# Patient Record
Sex: Female | Born: 1985 | Race: Black or African American | Hispanic: No | Marital: Single | Smoking: Current every day smoker
Health system: Southern US, Community
[De-identification: ages and names within clinical notes are randomized; demographics above are authoritative.]

## PROBLEM LIST (undated history)

## (undated) DIAGNOSIS — N83209 Unspecified ovarian cyst, unspecified side: Secondary | ICD-10-CM

## (undated) DIAGNOSIS — N200 Calculus of kidney: Secondary | ICD-10-CM

## (undated) DIAGNOSIS — G8929 Other chronic pain: Secondary | ICD-10-CM

## (undated) DIAGNOSIS — R102 Pelvic and perineal pain: Secondary | ICD-10-CM

## (undated) DIAGNOSIS — N390 Urinary tract infection, site not specified: Secondary | ICD-10-CM

## (undated) HISTORY — PX: LITHOTRIPSY: SUR834

## (undated) HISTORY — PX: OVARIAN CYST REMOVAL: SHX89

## (undated) HISTORY — PX: LAPAROSCOPIC TUBAL LIGATION: SUR803

## (undated) HISTORY — PX: CHOLECYSTECTOMY: SHX55

## (undated) HISTORY — PX: APPENDECTOMY: SHX54

---
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
## 2004-09-23 ENCOUNTER — Emergency Department (HOSPITAL_COMMUNITY): Admission: EM | Admit: 2004-09-23 | Discharge: 2004-09-23 | Payer: Self-pay | Admitting: Emergency Medicine

## 2004-10-12 ENCOUNTER — Emergency Department (HOSPITAL_COMMUNITY): Admission: EM | Admit: 2004-10-12 | Discharge: 2004-10-12 | Payer: Self-pay | Admitting: Emergency Medicine

## 2004-10-21 ENCOUNTER — Emergency Department (HOSPITAL_COMMUNITY): Admission: EM | Admit: 2004-10-21 | Discharge: 2004-10-21 | Payer: Self-pay | Admitting: Emergency Medicine

## 2004-12-07 ENCOUNTER — Emergency Department (HOSPITAL_COMMUNITY): Admission: EM | Admit: 2004-12-07 | Discharge: 2004-12-07 | Payer: Self-pay | Admitting: Emergency Medicine

## 2004-12-29 ENCOUNTER — Emergency Department (HOSPITAL_COMMUNITY): Admission: EM | Admit: 2004-12-29 | Discharge: 2004-12-30 | Payer: Self-pay | Admitting: Emergency Medicine

## 2005-01-07 ENCOUNTER — Emergency Department (HOSPITAL_COMMUNITY): Admission: EM | Admit: 2005-01-07 | Discharge: 2005-01-07 | Payer: Self-pay | Admitting: Emergency Medicine

## 2005-01-19 ENCOUNTER — Emergency Department (HOSPITAL_COMMUNITY): Admission: EM | Admit: 2005-01-19 | Discharge: 2005-01-19 | Payer: Self-pay | Admitting: Emergency Medicine

## 2005-01-25 ENCOUNTER — Emergency Department (HOSPITAL_COMMUNITY): Admission: EM | Admit: 2005-01-25 | Discharge: 2005-01-25 | Payer: Self-pay | Admitting: Emergency Medicine

## 2005-03-15 ENCOUNTER — Emergency Department (HOSPITAL_COMMUNITY): Admission: EM | Admit: 2005-03-15 | Discharge: 2005-03-16 | Payer: Self-pay | Admitting: Emergency Medicine

## 2005-09-20 ENCOUNTER — Inpatient Hospital Stay (HOSPITAL_COMMUNITY): Admission: EM | Admit: 2005-09-20 | Discharge: 2005-09-23 | Payer: Self-pay | Admitting: Emergency Medicine

## 2005-09-22 ENCOUNTER — Encounter (INDEPENDENT_AMBULATORY_CARE_PROVIDER_SITE_OTHER): Payer: Self-pay | Admitting: *Deleted

## 2005-12-06 ENCOUNTER — Emergency Department (HOSPITAL_COMMUNITY): Admission: EM | Admit: 2005-12-06 | Discharge: 2005-12-06 | Payer: Self-pay | Admitting: *Deleted

## 2006-03-18 ENCOUNTER — Emergency Department (HOSPITAL_COMMUNITY): Admission: EM | Admit: 2006-03-18 | Discharge: 2006-03-19 | Payer: Self-pay | Admitting: Emergency Medicine

## 2006-04-02 ENCOUNTER — Inpatient Hospital Stay (HOSPITAL_COMMUNITY): Admission: EM | Admit: 2006-04-02 | Discharge: 2006-04-08 | Payer: Self-pay | Admitting: Emergency Medicine

## 2006-07-16 ENCOUNTER — Inpatient Hospital Stay (HOSPITAL_COMMUNITY): Admission: EM | Admit: 2006-07-16 | Discharge: 2006-07-18 | Payer: Self-pay | Admitting: Emergency Medicine

## 2007-01-30 ENCOUNTER — Emergency Department (HOSPITAL_COMMUNITY): Admission: EM | Admit: 2007-01-30 | Discharge: 2007-01-31 | Payer: Self-pay | Admitting: Emergency Medicine

## 2007-10-12 ENCOUNTER — Emergency Department (HOSPITAL_COMMUNITY): Admission: EM | Admit: 2007-10-12 | Discharge: 2007-10-12 | Payer: Self-pay | Admitting: Emergency Medicine

## 2007-10-28 ENCOUNTER — Emergency Department (HOSPITAL_COMMUNITY): Admission: EM | Admit: 2007-10-28 | Discharge: 2007-10-29 | Payer: Self-pay | Admitting: Emergency Medicine

## 2007-12-03 ENCOUNTER — Emergency Department (HOSPITAL_COMMUNITY): Admission: EM | Admit: 2007-12-03 | Discharge: 2007-12-03 | Payer: Self-pay | Admitting: Emergency Medicine

## 2008-01-15 ENCOUNTER — Emergency Department (HOSPITAL_COMMUNITY): Admission: EM | Admit: 2008-01-15 | Discharge: 2008-01-16 | Payer: Self-pay | Admitting: Emergency Medicine

## 2008-01-22 ENCOUNTER — Emergency Department (HOSPITAL_COMMUNITY): Admission: EM | Admit: 2008-01-22 | Discharge: 2008-01-22 | Payer: Self-pay | Admitting: Emergency Medicine

## 2008-03-05 ENCOUNTER — Emergency Department (HOSPITAL_COMMUNITY): Admission: EM | Admit: 2008-03-05 | Discharge: 2008-03-05 | Payer: Self-pay | Admitting: Emergency Medicine

## 2008-03-25 ENCOUNTER — Emergency Department (HOSPITAL_COMMUNITY): Admission: EM | Admit: 2008-03-25 | Discharge: 2008-03-25 | Payer: Self-pay | Admitting: Emergency Medicine

## 2008-03-28 ENCOUNTER — Inpatient Hospital Stay (HOSPITAL_COMMUNITY): Admission: EM | Admit: 2008-03-28 | Discharge: 2008-03-30 | Payer: Self-pay | Admitting: Emergency Medicine

## 2008-04-19 ENCOUNTER — Emergency Department (HOSPITAL_COMMUNITY): Admission: EM | Admit: 2008-04-19 | Discharge: 2008-04-19 | Payer: Self-pay | Admitting: Emergency Medicine

## 2008-05-27 ENCOUNTER — Emergency Department (HOSPITAL_COMMUNITY): Admission: EM | Admit: 2008-05-27 | Discharge: 2008-05-27 | Payer: Self-pay | Admitting: Emergency Medicine

## 2008-05-31 ENCOUNTER — Emergency Department (HOSPITAL_COMMUNITY): Admission: EM | Admit: 2008-05-31 | Discharge: 2008-06-01 | Payer: Self-pay | Admitting: Emergency Medicine

## 2008-06-09 ENCOUNTER — Emergency Department (HOSPITAL_COMMUNITY): Admission: EM | Admit: 2008-06-09 | Discharge: 2008-06-09 | Payer: Self-pay | Admitting: Emergency Medicine

## 2008-06-12 ENCOUNTER — Emergency Department (HOSPITAL_COMMUNITY): Admission: EM | Admit: 2008-06-12 | Discharge: 2008-06-12 | Payer: Self-pay | Admitting: Emergency Medicine

## 2008-06-26 ENCOUNTER — Emergency Department (HOSPITAL_COMMUNITY): Admission: EM | Admit: 2008-06-26 | Discharge: 2008-06-26 | Payer: Self-pay | Admitting: Emergency Medicine

## 2008-07-01 ENCOUNTER — Emergency Department (HOSPITAL_COMMUNITY): Admission: EM | Admit: 2008-07-01 | Discharge: 2008-07-01 | Payer: Self-pay | Admitting: Emergency Medicine

## 2008-07-11 ENCOUNTER — Emergency Department (HOSPITAL_COMMUNITY): Admission: EM | Admit: 2008-07-11 | Discharge: 2008-07-11 | Payer: Self-pay | Admitting: Emergency Medicine

## 2008-07-23 ENCOUNTER — Inpatient Hospital Stay (HOSPITAL_COMMUNITY): Admission: RE | Admit: 2008-07-23 | Discharge: 2008-07-25 | Payer: Self-pay | Admitting: Obstetrics and Gynecology

## 2008-07-23 ENCOUNTER — Other Ambulatory Visit: Payer: Self-pay | Admitting: Obstetrics and Gynecology

## 2008-07-30 ENCOUNTER — Ambulatory Visit (HOSPITAL_COMMUNITY): Admission: RE | Admit: 2008-07-30 | Discharge: 2008-07-30 | Payer: Self-pay | Admitting: Obstetrics and Gynecology

## 2008-08-02 ENCOUNTER — Emergency Department (HOSPITAL_COMMUNITY): Admission: EM | Admit: 2008-08-02 | Discharge: 2008-08-03 | Payer: Self-pay | Admitting: Emergency Medicine

## 2008-09-15 ENCOUNTER — Emergency Department (HOSPITAL_COMMUNITY): Admission: EM | Admit: 2008-09-15 | Discharge: 2008-09-15 | Payer: Self-pay | Admitting: Emergency Medicine

## 2008-09-19 ENCOUNTER — Emergency Department (HOSPITAL_COMMUNITY): Admission: EM | Admit: 2008-09-19 | Discharge: 2008-09-19 | Payer: Self-pay | Admitting: Emergency Medicine

## 2008-10-30 ENCOUNTER — Emergency Department (HOSPITAL_COMMUNITY): Admission: EM | Admit: 2008-10-30 | Discharge: 2008-10-31 | Payer: Self-pay | Admitting: Emergency Medicine

## 2008-11-20 ENCOUNTER — Emergency Department (HOSPITAL_COMMUNITY): Admission: EM | Admit: 2008-11-20 | Discharge: 2008-11-20 | Payer: Self-pay | Admitting: Emergency Medicine

## 2009-01-08 ENCOUNTER — Emergency Department (HOSPITAL_COMMUNITY): Admission: EM | Admit: 2009-01-08 | Discharge: 2009-01-08 | Payer: Self-pay | Admitting: Emergency Medicine

## 2009-01-22 ENCOUNTER — Emergency Department (HOSPITAL_COMMUNITY): Admission: EM | Admit: 2009-01-22 | Discharge: 2009-01-22 | Payer: Self-pay | Admitting: Emergency Medicine

## 2009-01-26 ENCOUNTER — Emergency Department (HOSPITAL_COMMUNITY): Admission: EM | Admit: 2009-01-26 | Discharge: 2009-01-27 | Payer: Self-pay | Admitting: Emergency Medicine

## 2009-02-18 ENCOUNTER — Emergency Department (HOSPITAL_COMMUNITY): Admission: EM | Admit: 2009-02-18 | Discharge: 2009-02-18 | Payer: Self-pay | Admitting: Emergency Medicine

## 2009-04-16 ENCOUNTER — Emergency Department (HOSPITAL_COMMUNITY): Admission: EM | Admit: 2009-04-16 | Discharge: 2009-04-16 | Payer: Self-pay | Admitting: Emergency Medicine

## 2009-07-03 ENCOUNTER — Emergency Department (HOSPITAL_COMMUNITY): Admission: EM | Admit: 2009-07-03 | Discharge: 2009-07-03 | Payer: Self-pay | Admitting: Emergency Medicine

## 2009-07-20 ENCOUNTER — Emergency Department (HOSPITAL_COMMUNITY): Admission: EM | Admit: 2009-07-20 | Discharge: 2009-07-20 | Payer: Self-pay | Admitting: Emergency Medicine

## 2009-08-14 ENCOUNTER — Emergency Department (HOSPITAL_COMMUNITY): Admission: EM | Admit: 2009-08-14 | Discharge: 2009-08-14 | Payer: Self-pay | Admitting: Emergency Medicine

## 2009-08-22 ENCOUNTER — Emergency Department (HOSPITAL_COMMUNITY): Admission: EM | Admit: 2009-08-22 | Discharge: 2009-08-22 | Payer: Self-pay | Admitting: Emergency Medicine

## 2009-09-01 ENCOUNTER — Emergency Department (HOSPITAL_COMMUNITY): Admission: EM | Admit: 2009-09-01 | Discharge: 2009-09-01 | Payer: Self-pay | Admitting: Emergency Medicine

## 2009-10-09 ENCOUNTER — Emergency Department (HOSPITAL_COMMUNITY): Admission: EM | Admit: 2009-10-09 | Discharge: 2009-10-10 | Payer: Self-pay | Admitting: Emergency Medicine

## 2009-10-11 ENCOUNTER — Emergency Department (HOSPITAL_COMMUNITY): Admission: EM | Admit: 2009-10-11 | Discharge: 2009-10-11 | Payer: Self-pay | Admitting: Emergency Medicine

## 2009-12-03 ENCOUNTER — Emergency Department (HOSPITAL_COMMUNITY): Admission: EM | Admit: 2009-12-03 | Discharge: 2009-12-03 | Payer: Self-pay | Admitting: Emergency Medicine

## 2009-12-11 ENCOUNTER — Emergency Department (HOSPITAL_COMMUNITY): Admission: EM | Admit: 2009-12-11 | Discharge: 2009-12-11 | Payer: Self-pay | Admitting: Emergency Medicine

## 2010-01-17 ENCOUNTER — Emergency Department (HOSPITAL_COMMUNITY)
Admission: EM | Admit: 2010-01-17 | Discharge: 2010-01-17 | Payer: Self-pay | Source: Home / Self Care | Admitting: Emergency Medicine

## 2010-03-02 ENCOUNTER — Encounter: Payer: Self-pay | Admitting: Preventative Medicine

## 2010-03-02 ENCOUNTER — Encounter: Payer: Self-pay | Admitting: Emergency Medicine

## 2010-03-06 ENCOUNTER — Emergency Department (HOSPITAL_COMMUNITY)
Admission: EM | Admit: 2010-03-06 | Discharge: 2010-03-07 | Payer: Self-pay | Source: Home / Self Care | Admitting: Emergency Medicine

## 2010-03-06 LAB — URINALYSIS, ROUTINE W REFLEX MICROSCOPIC
Bilirubin Urine: NEGATIVE
Ketones, ur: NEGATIVE mg/dL
Leukocytes, UA: NEGATIVE
Protein, ur: NEGATIVE mg/dL
Specific Gravity, Urine: 1.015 (ref 1.005–1.030)
Urine Glucose, Fasting: NEGATIVE mg/dL
Urobilinogen, UA: 0.2 mg/dL (ref 0.0–1.0)
pH: 7 (ref 5.0–8.0)

## 2010-03-06 LAB — URINE MICROSCOPIC-ADD ON

## 2010-04-01 ENCOUNTER — Ambulatory Visit (INDEPENDENT_AMBULATORY_CARE_PROVIDER_SITE_OTHER): Payer: Medicaid Other | Admitting: Urology

## 2010-04-01 DIAGNOSIS — N200 Calculus of kidney: Secondary | ICD-10-CM

## 2010-04-02 ENCOUNTER — Emergency Department (HOSPITAL_COMMUNITY)
Admission: EM | Admit: 2010-04-02 | Discharge: 2010-04-03 | Disposition: A | Discharge status: Home / Self Care | Payer: Medicaid Other | Attending: Emergency Medicine | Admitting: Emergency Medicine

## 2010-04-02 DIAGNOSIS — N201 Calculus of ureter: Secondary | ICD-10-CM | POA: Insufficient documentation

## 2010-04-02 DIAGNOSIS — R109 Unspecified abdominal pain: Secondary | ICD-10-CM | POA: Insufficient documentation

## 2010-04-02 DIAGNOSIS — I10 Essential (primary) hypertension: Secondary | ICD-10-CM | POA: Insufficient documentation

## 2010-04-02 DIAGNOSIS — R3915 Urgency of urination: Secondary | ICD-10-CM | POA: Insufficient documentation

## 2010-04-02 LAB — URINALYSIS, ROUTINE W REFLEX MICROSCOPIC
Ketones, ur: NEGATIVE mg/dL
Leukocytes, UA: NEGATIVE
Nitrite: NEGATIVE
Specific Gravity, Urine: 1.025 (ref 1.005–1.030)
Urine Glucose, Fasting: NEGATIVE mg/dL
pH: 6 (ref 5.0–8.0)

## 2010-04-02 LAB — URINE MICROSCOPIC-ADD ON

## 2010-04-03 LAB — BASIC METABOLIC PANEL
BUN: 8 mg/dL (ref 6–23)
Calcium: 9.4 mg/dL (ref 8.4–10.5)
Creatinine, Ser: 0.74 mg/dL (ref 0.4–1.2)
GFR calc non Af Amer: >60 mL/min (ref >60)
Glucose, Bld: 79 mg/dL (ref 70–99)
Potassium: 3.6 mEq/L (ref 3.5–5.1)

## 2010-04-21 LAB — CBC
HCT: 35.3 % — ABNORMAL LOW (ref 36.0–46.0)
MCH: 29.2 pg (ref 26.0–34.0)
MCHC: 34 g/dL (ref 30.0–36.0)
MCV: 85.9 fL (ref 78.0–100.0)
Platelets: 243 10*3/uL (ref 150–400)
RDW: 14.1 % (ref 11.5–15.5)
WBC: 6.3 10*3/uL (ref 4.0–10.5)

## 2010-04-21 LAB — COMPREHENSIVE METABOLIC PANEL
Albumin: 4 g/dL (ref 3.5–5.2)
Alkaline Phosphatase: 82 U/L (ref 39–117)
BUN: 7 mg/dL (ref 6–23)
Creatinine, Ser: 0.74 mg/dL (ref 0.4–1.2)
Glucose, Bld: 93 mg/dL (ref 70–99)
Potassium: 3.9 mEq/L (ref 3.5–5.1)
Total Protein: 7 g/dL (ref 6.0–8.3)

## 2010-04-21 LAB — URINALYSIS, ROUTINE W REFLEX MICROSCOPIC
Bilirubin Urine: NEGATIVE
Ketones, ur: NEGATIVE mg/dL
Nitrite: NEGATIVE
Protein, ur: NEGATIVE mg/dL
Specific Gravity, Urine: 1.005 (ref 1.005–1.030)
Urobilinogen, UA: 0.2 mg/dL (ref 0.0–1.0)

## 2010-04-21 LAB — DIFFERENTIAL
Basophils Absolute: 0 10*3/uL (ref 0.0–0.1)
Basophils Relative: 1 % (ref 0–1)
Lymphocytes Relative: 34 % (ref 12–46)
Monocytes Absolute: 0.5 10*3/uL (ref 0.1–1.0)
Monocytes Relative: 8 % (ref 3–12)
Neutro Abs: 3.5 10*3/uL (ref 1.7–7.7)
Neutrophils Relative %: 56 % (ref 43–77)

## 2010-04-21 LAB — PREGNANCY, URINE: Preg Test, Ur: NEGATIVE

## 2010-04-23 LAB — DIFFERENTIAL
Basophils Absolute: 0.1 10*3/uL (ref 0.0–0.1)
Eosinophils Relative: 1 % (ref 0–5)
Lymphocytes Relative: 27 % (ref 12–46)
Neutro Abs: 4.9 10*3/uL (ref 1.7–7.7)
Neutrophils Relative %: 66 % (ref 43–77)

## 2010-04-23 LAB — URINALYSIS, ROUTINE W REFLEX MICROSCOPIC
Ketones, ur: NEGATIVE mg/dL
Leukocytes, UA: NEGATIVE
Nitrite: POSITIVE — AB
Specific Gravity, Urine: 1.02 (ref 1.005–1.030)
pH: 6 (ref 5.0–8.0)

## 2010-04-23 LAB — URINE CULTURE: Culture  Setup Time: 201110261220

## 2010-04-23 LAB — COMPREHENSIVE METABOLIC PANEL
BUN: 6 mg/dL (ref 6–23)
CO2: 26 mEq/L (ref 19–32)
Calcium: 9.6 mg/dL (ref 8.4–10.5)
Chloride: 107 mEq/L (ref 96–112)
Creatinine, Ser: 0.76 mg/dL (ref 0.4–1.2)
GFR calc non Af Amer: >60 mL/min (ref >60)
Glucose, Bld: 121 mg/dL — ABNORMAL HIGH (ref 70–99)
Total Bilirubin: 0.5 mg/dL (ref 0.3–1.2)

## 2010-04-23 LAB — CBC
Hemoglobin: 12 g/dL (ref 12.0–15.0)
MCH: 29 pg (ref 26.0–34.0)
MCHC: 33.5 g/dL (ref 30.0–36.0)
MCV: 86.6 fL (ref 78.0–100.0)
RBC: 4.14 MIL/uL (ref 3.87–5.11)

## 2010-04-23 LAB — URINE MICROSCOPIC-ADD ON

## 2010-04-23 LAB — PREGNANCY, URINE: Preg Test, Ur: NEGATIVE

## 2010-04-24 LAB — BASIC METABOLIC PANEL
Calcium: 9.3 mg/dL (ref 8.4–10.5)
Chloride: 104 mEq/L (ref 96–112)
Creatinine, Ser: 0.71 mg/dL (ref 0.4–1.2)
GFR calc Af Amer: >60 mL/min (ref >60)

## 2010-04-24 LAB — GC/CHLAMYDIA PROBE AMP, GENITAL: GC Probe Amp, Genital: NEGATIVE

## 2010-04-24 LAB — WET PREP, GENITAL
Trich, Wet Prep: NONE SEEN
Yeast Wet Prep HPF POC: NONE SEEN

## 2010-04-24 LAB — DIFFERENTIAL
Lymphocytes Relative: 32 % (ref 12–46)
Lymphs Abs: 1.7 10*3/uL (ref 0.7–4.0)
Neutro Abs: 3.2 10*3/uL (ref 1.7–7.7)
Neutrophils Relative %: 61 % (ref 43–77)

## 2010-04-24 LAB — URINE MICROSCOPIC-ADD ON

## 2010-04-24 LAB — URINALYSIS, ROUTINE W REFLEX MICROSCOPIC
Bilirubin Urine: NEGATIVE
Specific Gravity, Urine: 1.025 (ref 1.005–1.030)
Urobilinogen, UA: 0.2 mg/dL (ref 0.0–1.0)

## 2010-04-24 LAB — URINE CULTURE

## 2010-04-24 LAB — CBC
Platelets: 226 10*3/uL (ref 150–400)
RBC: 4.29 MIL/uL (ref 3.87–5.11)
WBC: 5.3 10*3/uL (ref 4.0–10.5)

## 2010-04-24 LAB — POCT PREGNANCY, URINE: Preg Test, Ur: NEGATIVE

## 2010-04-25 LAB — URINALYSIS, ROUTINE W REFLEX MICROSCOPIC
Protein, ur: NEGATIVE mg/dL
Urobilinogen, UA: 0.2 mg/dL (ref 0.0–1.0)

## 2010-04-25 LAB — URINE MICROSCOPIC-ADD ON

## 2010-04-26 LAB — BASIC METABOLIC PANEL
Calcium: 9.1 mg/dL (ref 8.4–10.5)
Chloride: 102 mEq/L (ref 96–112)
Creatinine, Ser: 0.7 mg/dL (ref 0.4–1.2)
GFR calc Af Amer: >60 mL/min (ref >60)
GFR calc non Af Amer: >60 mL/min (ref >60)

## 2010-04-26 LAB — GC/CHLAMYDIA PROBE AMP, GENITAL
Chlamydia, DNA Probe: NEGATIVE
GC Probe Amp, Genital: NEGATIVE

## 2010-04-26 LAB — WET PREP, GENITAL: Trich, Wet Prep: NONE SEEN

## 2010-04-26 LAB — DIFFERENTIAL
Basophils Absolute: 0 10*3/uL (ref 0.0–0.1)
Lymphocytes Relative: 35 % (ref 12–46)
Lymphs Abs: 2.3 10*3/uL (ref 0.7–4.0)
Neutro Abs: 3.7 10*3/uL (ref 1.7–7.7)
Neutrophils Relative %: 57 % (ref 43–77)

## 2010-04-26 LAB — URINE MICROSCOPIC-ADD ON

## 2010-04-26 LAB — CBC
Platelets: 232 10*3/uL (ref 150–400)
RBC: 4.08 MIL/uL (ref 3.87–5.11)
RDW: 14.3 % (ref 11.5–15.5)
WBC: 6.5 10*3/uL (ref 4.0–10.5)

## 2010-04-26 LAB — URINALYSIS, ROUTINE W REFLEX MICROSCOPIC
Glucose, UA: NEGATIVE mg/dL
Protein, ur: NEGATIVE mg/dL
pH: 7 (ref 5.0–8.0)

## 2010-04-27 LAB — URINE MICROSCOPIC-ADD ON

## 2010-04-27 LAB — BASIC METABOLIC PANEL
CO2: 25 mEq/L (ref 19–32)
Chloride: 106 mEq/L (ref 96–112)
Creatinine, Ser: 0.7 mg/dL (ref 0.4–1.2)
GFR calc Af Amer: >60 mL/min (ref >60)
Glucose, Bld: 108 mg/dL — ABNORMAL HIGH (ref 70–99)

## 2010-04-27 LAB — CBC
Hemoglobin: 11.5 g/dL — ABNORMAL LOW (ref 12.0–15.0)
MCH: 29.3 pg (ref 26.0–34.0)
MCHC: 33.9 g/dL (ref 30.0–36.0)
MCV: 86.3 fL (ref 78.0–100.0)
Platelets: 310 10*3/uL (ref 150–400)
RBC: 3.94 MIL/uL (ref 3.87–5.11)

## 2010-04-27 LAB — DIFFERENTIAL
Basophils Relative: 2 % — ABNORMAL HIGH (ref 0–1)
Eosinophils Absolute: 0.1 10*3/uL (ref 0.0–0.7)
Eosinophils Relative: 1 % (ref 0–5)
Lymphs Abs: 2 10*3/uL (ref 0.7–4.0)
Monocytes Relative: 5 % (ref 3–12)

## 2010-04-27 LAB — GC/CHLAMYDIA PROBE AMP, GENITAL
Chlamydia, DNA Probe: NEGATIVE
GC Probe Amp, Genital: NEGATIVE

## 2010-04-27 LAB — URINALYSIS, ROUTINE W REFLEX MICROSCOPIC
Bilirubin Urine: NEGATIVE
Glucose, UA: NEGATIVE mg/dL
Glucose, UA: NEGATIVE mg/dL
Ketones, ur: NEGATIVE mg/dL
Ketones, ur: NEGATIVE mg/dL
Leukocytes, UA: NEGATIVE
Nitrite: NEGATIVE
Protein, ur: NEGATIVE mg/dL
Specific Gravity, Urine: 1.02 (ref 1.005–1.030)
pH: 6.5 (ref 5.0–8.0)
pH: 6.5 (ref 5.0–8.0)

## 2010-04-27 LAB — WET PREP, GENITAL
Trich, Wet Prep: NONE SEEN
Yeast Wet Prep HPF POC: NONE SEEN

## 2010-04-28 LAB — URINALYSIS, ROUTINE W REFLEX MICROSCOPIC
Bilirubin Urine: NEGATIVE
Glucose, UA: NEGATIVE mg/dL
Ketones, ur: NEGATIVE mg/dL
Protein, ur: NEGATIVE mg/dL
pH: 5.5 (ref 5.0–8.0)

## 2010-04-28 LAB — WET PREP, GENITAL
Trich, Wet Prep: NONE SEEN
Yeast Wet Prep HPF POC: NONE SEEN

## 2010-04-28 LAB — URINE MICROSCOPIC-ADD ON

## 2010-05-04 LAB — URINALYSIS, ROUTINE W REFLEX MICROSCOPIC
Bilirubin Urine: NEGATIVE
Glucose, UA: NEGATIVE mg/dL
Hgb urine dipstick: NEGATIVE
Nitrite: NEGATIVE
Specific Gravity, Urine: 1.02 (ref 1.005–1.030)
pH: 6 (ref 5.0–8.0)

## 2010-05-04 LAB — URINE CULTURE
Colony Count: NO GROWTH
Culture: NO GROWTH

## 2010-05-04 LAB — PREGNANCY, URINE: Preg Test, Ur: NEGATIVE

## 2010-05-11 ENCOUNTER — Emergency Department (HOSPITAL_COMMUNITY): Payer: Medicaid Other

## 2010-05-11 ENCOUNTER — Emergency Department (HOSPITAL_COMMUNITY)
Admission: EM | Admit: 2010-05-11 | Discharge: 2010-05-12 | Disposition: A | Discharge status: Home / Self Care | Payer: Medicaid Other | Attending: Emergency Medicine | Admitting: Emergency Medicine

## 2010-05-11 DIAGNOSIS — J45909 Unspecified asthma, uncomplicated: Secondary | ICD-10-CM | POA: Insufficient documentation

## 2010-05-11 DIAGNOSIS — R109 Unspecified abdominal pain: Secondary | ICD-10-CM | POA: Insufficient documentation

## 2010-05-11 DIAGNOSIS — R319 Hematuria, unspecified: Secondary | ICD-10-CM | POA: Insufficient documentation

## 2010-05-11 DIAGNOSIS — Z87442 Personal history of urinary calculi: Secondary | ICD-10-CM | POA: Insufficient documentation

## 2010-05-11 LAB — CBC
Hemoglobin: 12.7 g/dL (ref 12.0–15.0)
MCH: 29.1 pg (ref 26.0–34.0)
RBC: 4.37 MIL/uL (ref 3.87–5.11)

## 2010-05-11 LAB — COMPREHENSIVE METABOLIC PANEL
AST: 23 U/L (ref 0–37)
Albumin: 4.2 g/dL (ref 3.5–5.2)
Calcium: 9.5 mg/dL (ref 8.4–10.5)
Creatinine, Ser: 0.78 mg/dL (ref 0.4–1.2)
GFR calc Af Amer: >60 mL/min (ref >60)
Total Protein: 8.2 g/dL (ref 6.0–8.3)

## 2010-05-11 LAB — URINALYSIS, ROUTINE W REFLEX MICROSCOPIC
Bilirubin Urine: NEGATIVE
Ketones, ur: NEGATIVE mg/dL
Nitrite: NEGATIVE
pH: 7.5 (ref 5.0–8.0)

## 2010-05-11 LAB — POCT CARDIAC MARKERS
Myoglobin, poc: 40.8 ng/mL (ref 12–200)
Troponin i, poc: <0.05 ng/mL (ref 0.00–0.09)

## 2010-05-11 LAB — URINE MICROSCOPIC-ADD ON

## 2010-05-12 ENCOUNTER — Other Ambulatory Visit (HOSPITAL_COMMUNITY): Payer: Self-pay | Admitting: Emergency Medicine

## 2010-05-12 ENCOUNTER — Other Ambulatory Visit (HOSPITAL_COMMUNITY): Payer: Medicaid Other

## 2010-05-12 LAB — URINE MICROSCOPIC-ADD ON

## 2010-05-12 LAB — URINALYSIS, ROUTINE W REFLEX MICROSCOPIC
Bilirubin Urine: NEGATIVE
Leukocytes, UA: NEGATIVE
Nitrite: NEGATIVE
Specific Gravity, Urine: >1.03 — ABNORMAL HIGH (ref 1.005–1.030)
Urobilinogen, UA: 0.2 mg/dL (ref 0.0–1.0)
pH: 6 (ref 5.0–8.0)

## 2010-05-12 LAB — RAPID STREP SCREEN (MED CTR MEBANE ONLY): Streptococcus, Group A Screen (Direct): NEGATIVE

## 2010-05-12 LAB — PREGNANCY, URINE: Preg Test, Ur: NEGATIVE

## 2010-05-12 LAB — APTT: aPTT: 25 seconds (ref 24–37)

## 2010-05-13 LAB — URINALYSIS, ROUTINE W REFLEX MICROSCOPIC
Glucose, UA: NEGATIVE mg/dL
Ketones, ur: NEGATIVE mg/dL
Leukocytes, UA: NEGATIVE
pH: 6.5 (ref 5.0–8.0)

## 2010-05-13 LAB — URINE MICROSCOPIC-ADD ON

## 2010-05-13 LAB — BASIC METABOLIC PANEL
Calcium: 9.2 mg/dL (ref 8.4–10.5)
GFR calc Af Amer: >60 mL/min (ref >60)
GFR calc non Af Amer: >60 mL/min (ref >60)
Potassium: 3.6 mEq/L (ref 3.5–5.1)
Sodium: 138 mEq/L (ref 135–145)

## 2010-05-14 LAB — URINALYSIS, ROUTINE W REFLEX MICROSCOPIC
Nitrite: NEGATIVE
Specific Gravity, Urine: 1.02 (ref 1.005–1.030)
Urobilinogen, UA: 0.2 mg/dL (ref 0.0–1.0)

## 2010-05-15 LAB — BASIC METABOLIC PANEL
Calcium: 8.8 mg/dL (ref 8.4–10.5)
GFR calc non Af Amer: >60 mL/min (ref >60)
Glucose, Bld: 89 mg/dL (ref 70–99)
Sodium: 137 mEq/L (ref 135–145)

## 2010-05-15 LAB — URINE CULTURE

## 2010-05-15 LAB — DIFFERENTIAL
Basophils Absolute: 0 10*3/uL (ref 0.0–0.1)
Lymphocytes Relative: 41 % (ref 12–46)
Monocytes Absolute: 0.3 10*3/uL (ref 0.1–1.0)
Neutro Abs: 2.2 10*3/uL (ref 1.7–7.7)

## 2010-05-15 LAB — PREGNANCY, URINE: Preg Test, Ur: NEGATIVE

## 2010-05-15 LAB — URINALYSIS, ROUTINE W REFLEX MICROSCOPIC
Glucose, UA: NEGATIVE mg/dL
Ketones, ur: NEGATIVE mg/dL
Leukocytes, UA: NEGATIVE
Nitrite: POSITIVE — AB
Protein, ur: NEGATIVE mg/dL
Urobilinogen, UA: 0.2 mg/dL (ref 0.0–1.0)

## 2010-05-15 LAB — CBC
Hemoglobin: 12.1 g/dL (ref 12.0–15.0)
Platelets: 192 10*3/uL (ref 150–400)
RDW: 14.4 % (ref 11.5–15.5)

## 2010-05-16 LAB — URINALYSIS, ROUTINE W REFLEX MICROSCOPIC
Leukocytes, UA: NEGATIVE
Protein, ur: NEGATIVE mg/dL
Urobilinogen, UA: 0.2 mg/dL (ref 0.0–1.0)

## 2010-05-16 LAB — URINE MICROSCOPIC-ADD ON

## 2010-05-16 LAB — HEMOGLOBIN AND HEMATOCRIT, BLOOD: Hemoglobin: 12.8 g/dL (ref 12.0–15.0)

## 2010-05-17 LAB — DIFFERENTIAL
Basophils Absolute: 0 10*3/uL (ref 0.0–0.1)
Basophils Absolute: 0.1 10*3/uL (ref 0.0–0.1)
Eosinophils Relative: 1 % (ref 0–5)
Lymphocytes Relative: 30 % (ref 12–46)
Lymphocytes Relative: 38 % (ref 12–46)
Lymphs Abs: 2.1 10*3/uL (ref 0.7–4.0)
Neutro Abs: 2.8 10*3/uL (ref 1.7–7.7)
Neutro Abs: 2.8 10*3/uL (ref 1.7–7.7)
Neutrophils Relative %: 59 % (ref 43–77)

## 2010-05-17 LAB — URINE CULTURE: Colony Count: 25000

## 2010-05-17 LAB — WET PREP, GENITAL
Trich, Wet Prep: NONE SEEN
Yeast Wet Prep HPF POC: NONE SEEN

## 2010-05-17 LAB — URINE MICROSCOPIC-ADD ON

## 2010-05-17 LAB — CBC
HCT: 33.6 % — ABNORMAL LOW (ref 36.0–46.0)
HCT: 34.2 % — ABNORMAL LOW (ref 36.0–46.0)
MCHC: 34.1 g/dL (ref 30.0–36.0)
MCHC: 34.3 g/dL (ref 30.0–36.0)
MCV: 87.4 fL (ref 78.0–100.0)
MCV: 87.5 fL (ref 78.0–100.0)
Platelets: 234 10*3/uL (ref 150–400)
RBC: 3.84 MIL/uL — ABNORMAL LOW (ref 3.87–5.11)

## 2010-05-17 LAB — COMPREHENSIVE METABOLIC PANEL
AST: 55 U/L — ABNORMAL HIGH (ref 0–37)
Albumin: 4.5 g/dL (ref 3.5–5.2)
BUN: 8 mg/dL (ref 6–23)
CO2: 26 mEq/L (ref 19–32)
Calcium: 9.9 mg/dL (ref 8.4–10.5)
Creatinine, Ser: 0.69 mg/dL (ref 0.4–1.2)
GFR calc Af Amer: >60 mL/min (ref >60)
GFR calc non Af Amer: >60 mL/min (ref >60)

## 2010-05-17 LAB — URINALYSIS, ROUTINE W REFLEX MICROSCOPIC
Bilirubin Urine: NEGATIVE
Nitrite: NEGATIVE
Protein, ur: NEGATIVE mg/dL
Specific Gravity, Urine: 1.025 (ref 1.005–1.030)
Urobilinogen, UA: 0.2 mg/dL (ref 0.0–1.0)
Urobilinogen, UA: 0.2 mg/dL (ref 0.0–1.0)

## 2010-05-17 LAB — LIPASE, BLOOD: Lipase: 28 U/L (ref 11–59)

## 2010-05-17 LAB — PREGNANCY, URINE: Preg Test, Ur: NEGATIVE

## 2010-05-17 LAB — GC/CHLAMYDIA PROBE AMP, GENITAL: GC Probe Amp, Genital: NEGATIVE

## 2010-05-19 LAB — COMPREHENSIVE METABOLIC PANEL
ALT: 21 U/L (ref 0–35)
AST: 23 U/L (ref 0–37)
AST: 23 U/L (ref 0–37)
Albumin: 4.5 g/dL (ref 3.5–5.2)
Albumin: 3.9 g/dL (ref 3.5–5.2)
Calcium: 10.1 mg/dL (ref 8.4–10.5)
Calcium: 9.3 mg/dL (ref 8.4–10.5)
Creatinine, Ser: 0.74 mg/dL (ref 0.4–1.2)
Creatinine, Ser: 0.7 mg/dL (ref 0.4–1.2)
GFR calc Af Amer: >60 mL/min (ref >60)
GFR calc Af Amer: >60 mL/min (ref >60)
GFR calc non Af Amer: >60 mL/min (ref >60)
GFR calc non Af Amer: >60 mL/min (ref >60)
Sodium: 138 mEq/L (ref 135–145)
Total Protein: 8.2 g/dL (ref 6.0–8.3)
Total Protein: 7 g/dL (ref 6.0–8.3)

## 2010-05-19 LAB — BASIC METABOLIC PANEL
BUN: 4 mg/dL — ABNORMAL LOW (ref 6–23)
CO2: 27 mEq/L (ref 19–32)
Chloride: 104 mEq/L (ref 96–112)
Creatinine, Ser: 0.63 mg/dL (ref 0.4–1.2)

## 2010-05-19 LAB — URINE MICROSCOPIC-ADD ON

## 2010-05-19 LAB — URINALYSIS, ROUTINE W REFLEX MICROSCOPIC
Bilirubin Urine: NEGATIVE
Bilirubin Urine: NEGATIVE
Leukocytes, UA: NEGATIVE
Nitrite: NEGATIVE
Nitrite: NEGATIVE
Nitrite: NEGATIVE
Specific Gravity, Urine: 1.01 (ref 1.005–1.030)
Specific Gravity, Urine: 1.01 (ref 1.005–1.030)
Specific Gravity, Urine: 1.02 (ref 1.005–1.030)
Urobilinogen, UA: 0.2 mg/dL (ref 0.0–1.0)
Urobilinogen, UA: 0.2 mg/dL (ref 0.0–1.0)
Urobilinogen, UA: 0.2 mg/dL (ref 0.0–1.0)
pH: 6.5 (ref 5.0–8.0)
pH: 7 (ref 5.0–8.0)

## 2010-05-19 LAB — DIFFERENTIAL
Basophils Relative: 1 % (ref 0–1)
Eosinophils Relative: 1 % (ref 0–5)
Lymphocytes Relative: 46 % (ref 12–46)
Lymphs Abs: 1.9 10*3/uL (ref 0.7–4.0)
Lymphs Abs: 2.3 10*3/uL (ref 0.7–4.0)
Monocytes Absolute: 0.1 10*3/uL (ref 0.1–1.0)
Monocytes Relative: 2 % — ABNORMAL LOW (ref 3–12)
Monocytes Relative: 5 % (ref 3–12)
Neutrophils Relative %: 45 % (ref 43–77)

## 2010-05-19 LAB — CBC
HCT: 32.3 % — ABNORMAL LOW (ref 36.0–46.0)
MCHC: 33.8 g/dL (ref 30.0–36.0)
MCHC: 33.9 g/dL (ref 30.0–36.0)
MCV: 86.9 fL (ref 78.0–100.0)
MCV: 87.4 fL (ref 78.0–100.0)
Platelets: 233 10*3/uL (ref 150–400)
Platelets: 239 10*3/uL (ref 150–400)
RBC: 4.57 MIL/uL (ref 3.87–5.11)
RBC: 3.73 MIL/uL — ABNORMAL LOW (ref 3.87–5.11)
RDW: 15.5 % (ref 11.5–15.5)
RDW: 15.8 % — ABNORMAL HIGH (ref 11.5–15.5)
WBC: 5.1 10*3/uL (ref 4.0–10.5)

## 2010-05-19 LAB — LIPASE, BLOOD: Lipase: 28 U/L (ref 11–59)

## 2010-05-19 LAB — TYPE AND SCREEN: Antibody Screen: NEGATIVE

## 2010-05-19 LAB — URINE CULTURE

## 2010-05-19 LAB — PREGNANCY, URINE: Preg Test, Ur: NEGATIVE

## 2010-05-20 LAB — URINALYSIS, ROUTINE W REFLEX MICROSCOPIC
Bilirubin Urine: NEGATIVE
Bilirubin Urine: NEGATIVE
Glucose, UA: NEGATIVE mg/dL
Glucose, UA: NEGATIVE mg/dL
Hgb urine dipstick: NEGATIVE
Ketones, ur: NEGATIVE mg/dL
Nitrite: NEGATIVE
Protein, ur: NEGATIVE mg/dL
Specific Gravity, Urine: 1.02 (ref 1.005–1.030)
Urobilinogen, UA: 0.2 mg/dL (ref 0.0–1.0)
pH: 6.5 (ref 5.0–8.0)

## 2010-05-20 LAB — GC/CHLAMYDIA PROBE AMP, GENITAL
Chlamydia, DNA Probe: NEGATIVE
Chlamydia, DNA Probe: NEGATIVE
GC Probe Amp, Genital: NEGATIVE

## 2010-05-20 LAB — COMPREHENSIVE METABOLIC PANEL
ALT: 41 U/L — ABNORMAL HIGH (ref 0–35)
Albumin: 3.9 g/dL (ref 3.5–5.2)
Alkaline Phosphatase: 88 U/L (ref 39–117)
Alkaline Phosphatase: 88 U/L (ref 39–117)
BUN: 7 mg/dL (ref 6–23)
CO2: 23 mEq/L (ref 19–32)
CO2: 24 mEq/L (ref 19–32)
Chloride: 106 mEq/L (ref 96–112)
Chloride: 108 mEq/L (ref 96–112)
GFR calc non Af Amer: >60 mL/min (ref >60)
Glucose, Bld: 84 mg/dL (ref 70–99)
Glucose, Bld: 98 mg/dL (ref 70–99)
Potassium: 3.4 mEq/L — ABNORMAL LOW (ref 3.5–5.1)
Potassium: 3.5 mEq/L (ref 3.5–5.1)
Sodium: 139 mEq/L (ref 135–145)
Total Bilirubin: 0.4 mg/dL (ref 0.3–1.2)
Total Bilirubin: 0.6 mg/dL (ref 0.3–1.2)

## 2010-05-20 LAB — DIFFERENTIAL
Basophils Absolute: 0.1 10*3/uL (ref 0.0–0.1)
Basophils Relative: 1 % (ref 0–1)
Basophils Relative: 2 % — ABNORMAL HIGH (ref 0–1)
Eosinophils Absolute: 0 10*3/uL (ref 0.0–0.7)
Monocytes Absolute: 0.3 10*3/uL (ref 0.1–1.0)
Neutro Abs: 3.1 10*3/uL (ref 1.7–7.7)
Neutrophils Relative %: 62 % (ref 43–77)

## 2010-05-20 LAB — LACTIC ACID, PLASMA: Lactic Acid, Venous: 0.8 mmol/L (ref 0.5–2.2)

## 2010-05-20 LAB — CBC
HCT: 34.1 % — ABNORMAL LOW (ref 36.0–46.0)
Hemoglobin: 11.6 g/dL — ABNORMAL LOW (ref 12.0–15.0)
Hemoglobin: 11.9 g/dL — ABNORMAL LOW (ref 12.0–15.0)
MCHC: 34.8 g/dL (ref 30.0–36.0)
RBC: 3.8 MIL/uL — ABNORMAL LOW (ref 3.87–5.11)
RBC: 3.92 MIL/uL (ref 3.87–5.11)
WBC: 5.5 10*3/uL (ref 4.0–10.5)

## 2010-05-20 LAB — PREGNANCY, URINE: Preg Test, Ur: NEGATIVE

## 2010-05-20 LAB — WET PREP, GENITAL
Trich, Wet Prep: NONE SEEN
WBC, Wet Prep HPF POC: NONE SEEN
Yeast Wet Prep HPF POC: NONE SEEN

## 2010-05-20 LAB — URINE MICROSCOPIC-ADD ON

## 2010-05-21 LAB — COMPREHENSIVE METABOLIC PANEL
ALT: 34 U/L (ref 0–35)
Alkaline Phosphatase: 72 U/L (ref 39–117)
BUN: 7 mg/dL (ref 6–23)
CO2: 25 mEq/L (ref 19–32)
Chloride: 108 mEq/L (ref 96–112)
Glucose, Bld: 132 mg/dL — ABNORMAL HIGH (ref 70–99)
Potassium: 3.5 mEq/L (ref 3.5–5.1)
Sodium: 140 mEq/L (ref 135–145)
Total Bilirubin: 0.3 mg/dL (ref 0.3–1.2)

## 2010-05-21 LAB — CBC
HCT: 35 % — ABNORMAL LOW (ref 36.0–46.0)
HCT: 35.7 % — ABNORMAL LOW (ref 36.0–46.0)
Hemoglobin: 11.7 g/dL — ABNORMAL LOW (ref 12.0–15.0)
Hemoglobin: 12.1 g/dL (ref 12.0–15.0)
MCHC: 34 g/dL (ref 30.0–36.0)
MCV: 86.3 fL (ref 78.0–100.0)
RBC: 4.03 MIL/uL (ref 3.87–5.11)
RBC: 4.14 MIL/uL (ref 3.87–5.11)
WBC: 4.7 10*3/uL (ref 4.0–10.5)

## 2010-05-21 LAB — GC/CHLAMYDIA PROBE AMP, GENITAL: GC Probe Amp, Genital: NEGATIVE

## 2010-05-21 LAB — URINALYSIS, ROUTINE W REFLEX MICROSCOPIC
Glucose, UA: NEGATIVE mg/dL
Glucose, UA: NEGATIVE mg/dL
Ketones, ur: NEGATIVE mg/dL
Ketones, ur: NEGATIVE mg/dL
Nitrite: NEGATIVE
Protein, ur: NEGATIVE mg/dL
Specific Gravity, Urine: >1.03 — ABNORMAL HIGH (ref 1.005–1.030)
pH: 6 (ref 5.0–8.0)
pH: 7 (ref 5.0–8.0)

## 2010-05-21 LAB — DIFFERENTIAL
Basophils Absolute: 0 10*3/uL (ref 0.0–0.1)
Basophils Relative: 1 % (ref 0–1)
Basophils Relative: 2 % — ABNORMAL HIGH (ref 0–1)
Eosinophils Absolute: 0.1 10*3/uL (ref 0.0–0.7)
Eosinophils Absolute: 0.1 10*3/uL (ref 0.0–0.7)
Monocytes Absolute: 0.2 10*3/uL (ref 0.1–1.0)
Monocytes Relative: 3 % (ref 3–12)
Monocytes Relative: 4 % (ref 3–12)
Neutro Abs: 2.8 10*3/uL (ref 1.7–7.7)
Neutrophils Relative %: 60 % (ref 43–77)

## 2010-05-21 LAB — URINE MICROSCOPIC-ADD ON

## 2010-05-21 LAB — WET PREP, GENITAL
Clue Cells Wet Prep HPF POC: NONE SEEN
Trich, Wet Prep: NONE SEEN

## 2010-05-22 LAB — URINE MICROSCOPIC-ADD ON

## 2010-05-22 LAB — GC/CHLAMYDIA PROBE AMP, GENITAL: Chlamydia, DNA Probe: NEGATIVE

## 2010-05-22 LAB — WET PREP, GENITAL
Trich, Wet Prep: NONE SEEN
WBC, Wet Prep HPF POC: NONE SEEN

## 2010-05-22 LAB — DIFFERENTIAL
Basophils Absolute: 0.1 10*3/uL (ref 0.0–0.1)
Eosinophils Absolute: 0.2 10*3/uL (ref 0.0–0.7)
Eosinophils Relative: 4 % (ref 0–5)
Lymphs Abs: 2.9 10*3/uL (ref 0.7–4.0)

## 2010-05-22 LAB — URINALYSIS, ROUTINE W REFLEX MICROSCOPIC
Glucose, UA: NEGATIVE mg/dL
Ketones, ur: NEGATIVE mg/dL
Specific Gravity, Urine: 1.015 (ref 1.005–1.030)
pH: 7 (ref 5.0–8.0)

## 2010-05-22 LAB — CBC
HCT: 34.7 % — ABNORMAL LOW (ref 36.0–46.0)
MCHC: 33.3 g/dL (ref 30.0–36.0)
MCV: 85.6 fL (ref 78.0–100.0)
Platelets: 240 10*3/uL (ref 150–400)
RDW: 15.6 % — ABNORMAL HIGH (ref 11.5–15.5)

## 2010-05-22 LAB — BASIC METABOLIC PANEL
Calcium: 8.6 mg/dL (ref 8.4–10.5)
Creatinine, Ser: 0.71 mg/dL (ref 0.4–1.2)
GFR calc Af Amer: >60 mL/min (ref >60)
Sodium: 135 mEq/L (ref 135–145)

## 2010-05-22 LAB — PREGNANCY, URINE: Preg Test, Ur: NEGATIVE

## 2010-05-26 LAB — CBC
MCHC: 32.9 g/dL (ref 30.0–36.0)
RDW: 16.7 % — ABNORMAL HIGH (ref 11.5–15.5)

## 2010-05-26 LAB — URINALYSIS, ROUTINE W REFLEX MICROSCOPIC
Bilirubin Urine: NEGATIVE
Ketones, ur: NEGATIVE mg/dL
Leukocytes, UA: NEGATIVE
Nitrite: POSITIVE — AB
Nitrite: POSITIVE — AB
Specific Gravity, Urine: 1.025 (ref 1.005–1.030)
Urobilinogen, UA: 0.2 mg/dL (ref 0.0–1.0)
pH: 6 (ref 5.0–8.0)

## 2010-05-26 LAB — DIFFERENTIAL
Basophils Absolute: 0.1 10*3/uL (ref 0.0–0.1)
Basophils Relative: 1 % (ref 0–1)
Eosinophils Absolute: 0.1 10*3/uL (ref 0.0–0.7)
Neutro Abs: 3.8 10*3/uL (ref 1.7–7.7)
Neutrophils Relative %: 59 % (ref 43–77)

## 2010-05-26 LAB — BASIC METABOLIC PANEL
BUN: 10 mg/dL (ref 6–23)
CO2: 22 mEq/L (ref 19–32)
Calcium: 8.8 mg/dL (ref 8.4–10.5)
Creatinine, Ser: 0.67 mg/dL (ref 0.4–1.2)
GFR calc Af Amer: >60 mL/min (ref >60)
Glucose, Bld: 104 mg/dL — ABNORMAL HIGH (ref 70–99)

## 2010-05-26 LAB — GC/CHLAMYDIA PROBE AMP, GENITAL: Chlamydia, DNA Probe: NEGATIVE

## 2010-05-26 LAB — PREGNANCY, URINE: Preg Test, Ur: NEGATIVE

## 2010-05-26 LAB — URINE MICROSCOPIC-ADD ON

## 2010-05-27 LAB — COMPREHENSIVE METABOLIC PANEL
BUN: 8 mg/dL (ref 6–23)
CO2: 23 mEq/L (ref 19–32)
Calcium: 9.2 mg/dL (ref 8.4–10.5)
GFR calc non Af Amer: >60 mL/min (ref >60)
Glucose, Bld: 86 mg/dL (ref 70–99)
Total Protein: 6.9 g/dL (ref 6.0–8.3)

## 2010-05-27 LAB — WET PREP, GENITAL
Clue Cells Wet Prep HPF POC: NONE SEEN
Trich, Wet Prep: NONE SEEN
WBC, Wet Prep HPF POC: NONE SEEN
Yeast Wet Prep HPF POC: NONE SEEN

## 2010-05-27 LAB — DIFFERENTIAL
Basophils Absolute: 0 10*3/uL (ref 0.0–0.1)
Basophils Relative: 1 % (ref 0–1)
Basophils Relative: 1 % (ref 0–1)
Eosinophils Absolute: 0.1 10*3/uL (ref 0.0–0.7)
Eosinophils Relative: 4 % (ref 0–5)
Lymphs Abs: 1.6 10*3/uL (ref 0.7–4.0)
Lymphs Abs: 2.7 10*3/uL (ref 0.7–4.0)
Monocytes Relative: 6 % (ref 3–12)
Neutro Abs: 3.3 10*3/uL (ref 1.7–7.7)
Neutrophils Relative %: 49 % (ref 43–77)
Neutrophils Relative %: 50 % (ref 43–77)

## 2010-05-27 LAB — URINALYSIS, ROUTINE W REFLEX MICROSCOPIC
Bilirubin Urine: NEGATIVE
Glucose, UA: NEGATIVE mg/dL
Ketones, ur: NEGATIVE mg/dL
Leukocytes, UA: NEGATIVE
Leukocytes, UA: NEGATIVE
Nitrite: POSITIVE — AB
Nitrite: POSITIVE — AB
Protein, ur: NEGATIVE mg/dL
Specific Gravity, Urine: 1.025 (ref 1.005–1.030)
pH: 6 (ref 5.0–8.0)
pH: 5.5 (ref 5.0–8.0)

## 2010-05-27 LAB — URINE MICROSCOPIC-ADD ON

## 2010-05-27 LAB — CBC
HCT: 33.5 % — ABNORMAL LOW (ref 36.0–46.0)
HCT: 33.5 % — ABNORMAL LOW (ref 36.0–46.0)
Hemoglobin: 11.3 g/dL — ABNORMAL LOW (ref 12.0–15.0)
MCHC: 33.7 g/dL (ref 30.0–36.0)
MCHC: 33.7 g/dL (ref 30.0–36.0)
MCV: 83.8 fL (ref 78.0–100.0)
MCV: 83.9 fL (ref 78.0–100.0)
Platelets: 195 10*3/uL (ref 150–400)
RDW: 16.1 % — ABNORMAL HIGH (ref 11.5–15.5)
RDW: 16.1 % — ABNORMAL HIGH (ref 11.5–15.5)
WBC: 4 10*3/uL (ref 4.0–10.5)

## 2010-05-27 LAB — URINE CULTURE

## 2010-05-27 LAB — BASIC METABOLIC PANEL
BUN: 5 mg/dL — ABNORMAL LOW (ref 6–23)
Chloride: 105 mEq/L (ref 96–112)
Creatinine, Ser: 0.67 mg/dL (ref 0.4–1.2)
Glucose, Bld: 86 mg/dL (ref 70–99)
Potassium: 3.8 mEq/L (ref 3.5–5.1)

## 2010-06-10 ENCOUNTER — Emergency Department (HOSPITAL_COMMUNITY)
Admission: EM | Admit: 2010-06-10 | Discharge: 2010-06-11 | Disposition: A | Discharge status: Home / Self Care | Payer: Medicaid Other | Attending: Emergency Medicine | Admitting: Emergency Medicine

## 2010-06-10 DIAGNOSIS — R1031 Right lower quadrant pain: Secondary | ICD-10-CM | POA: Insufficient documentation

## 2010-06-10 DIAGNOSIS — R319 Hematuria, unspecified: Secondary | ICD-10-CM | POA: Insufficient documentation

## 2010-06-10 DIAGNOSIS — G8929 Other chronic pain: Secondary | ICD-10-CM | POA: Insufficient documentation

## 2010-06-10 DIAGNOSIS — J45909 Unspecified asthma, uncomplicated: Secondary | ICD-10-CM | POA: Insufficient documentation

## 2010-06-11 LAB — URINALYSIS, ROUTINE W REFLEX MICROSCOPIC
Glucose, UA: NEGATIVE mg/dL
Ketones, ur: NEGATIVE mg/dL
Protein, ur: NEGATIVE mg/dL
Urobilinogen, UA: 0.2 mg/dL (ref 0.0–1.0)

## 2010-06-11 LAB — URINE MICROSCOPIC-ADD ON

## 2010-06-24 NOTE — H&P (Signed)
[name], MESSING              ACCOUNT NO.:  [id number]   MEDICAL RECORD NO.:  [id number]          PATIENT TYPE:  INP   LOCATION:  A415                          FACILITY:  [facility]   PHYSICIAN:  Tilda Burrow, M.D. DATE OF BIRTH:  Sep 14, 1985   DATE OF ADMISSION:  07/16/2006  DATE OF DISCHARGE:  LH                              HISTORY & PHYSICAL   ADMITTING DIAGNOSES:  1. Pregnancy, 8 months gestation.  2. Right lower quadrant pain, not related to pregnancy.  Rule out      kidney stone.  Rule out malingering.   HISTORY OF PRESENT ILLNESS:  This 25 year old female allegedly gravida  7, para 1, Ab 5, LMP December 12, 2005, is admitted for the second this  pregnancy to [facility] for complaints of right lower quadrant  pain.  She was seen this time after presenting to the emergency room  complaining of about three days of severe right lower quadrant pain, not  associated with fever or chills. There was equivocal history of urinary  frequency.   Laboratory evaluation on admission included a white count of 8800,  hemoglobin 9, hematocrit 26.  Normal electrolytes with BUN 3, creatinine  0.49, within normal limits.  The urinalysis is clear with  urinalysis  specifically negative for hemoglobin and nitrites. There is moderate  leukocyte esterase.  Routine microscopic shows 3-6 red cells and few  bacteria and a few epithelial cells on a clean catch specimen.  The pH  is 7.5.  The patient's complaints of severe right sided pain beginning  approximately posterior superior iliac crest and radiating in and around  the abdomen.  External fetal monitoring shows a normal fetal heart rate  tracing and no evidence of uterine contractions. She denies bleeding or  discharge.   Prenatal course has been notable for fragmented care.  She is allegedly  seeing Dr. Susanne Borders of [place], [place] (phone [phone]).  In review of records, she has only seen him once in March and once  in  April.  Prior to that she was referred from the [facility]  Department to his office.  A review of [facility] records that she was  admitted to [facility] during February 22 to 28 of 2008 for  complaints of right sided flank pain with pain reported as 7 out of 10  with similar normal white count and urinalysis negative for nitrites and  positive for small leukocyte esterase with negative microscopic. She had  renal ultrasound which showed no identified stones but given that she  had alleged history of stones, she was presumed to have a calculus.  Consultation with Dr. Rito Ehrlich was performed during the time that she  was in the hospital and she had a stent placed in the right ureter which  she initially reported as causing reduced pain.  She was sent home on  that and kept the stent for a few months. She reported that Dr. Rito Ehrlich  took it out and what sounded like taken out in the O.R. here but review  of records indicated it was not removed  here and must have been taken  out in the office, contrary to patient history.  Further review of old  hospital records shows the patient was seen in the prescription here  eight times during 2006 and 2007 and three times in [place] emergency  room for complaints. She was hospitalized here in March 2007 by Jannifer Franklin with complaints of right lower quadrant pain, very similar to  what she is complaining of now. She had given a confusing history of  draining of right hydrosalpinx and ovarian cystectomy at [facility].  Therefore, she was March 2007 by me to the O.R., where a very  small right hydrosalpinx was identified and a normal appearing appendix  with laparoscopic right salpingectomy and laparoscopic appendectomy were  performed.  Surgical history is also positive for cholecystectomy.  D  and C x3, laparoscopic cholecystectomy April 16, 2005, [place].  Mini  laparotomy, right ovarian cystectomy, [place], December  2006.  Diagnostic laparoscopy and drainage of right hydrosalpinx March 2007,  [facility] and then August 2007 diagnostic laparoscopy and right  salpingectomy and appendectomy by J.V. Emelda Fear.   Allergies include MORPHINE SULFATE, TORADOL AND IVP DYE.   Her social history is interesting in that the patient alleges at this  time that she is getting ready to move back to [place] and plans to  transfer care away from Dr. Dianne Dun.   PHYSICAL EXAMINATION:  GENERAL: General exam reveals a generally  healthy, well-nourished African-American female who appears in marked  discomfort, holding the right side. She refuses to establish eye contact  and talks through closed eyelids.  She is oriented x3.  NECK: Supple.  CHEST:  Breathing unlabored.  Deep respirations at 16 to 20 per minute.  ABDOMEN: Nontender to palpation.  External monitoring previously ruled  out uterine activity.  PELVIC EXAM:  Deferred at this time.  Renal ultrasound has been  performed this a.m. and is pending at the time of this dictation.  EXTREMITIES:  Grossly normal without cyanosis, clubbing or edema.  BACK:  Exam shows equivocal right CVA tenderness and nontender  paraspinous muscles to palpation   IMPRESSION:  Chronic, recurrent right lower quadrant pain with traumatic  presentation, essentially identical to presentations in 2007 and 2006,  with multiple interventions in the past not resulting in resolution of  complaints.   PLAN:  Obtain results of ultrasound.  Will try to establish the validity  of complaints.  Will not use narcotics. Will use antiemetics if needed.  Will need until the a.m. to establish confidence in diagnosis.      Tilda Burrow, M.D.  Electronically Signed     JVF/MEDQ  D:  07/17/2006  T:  07/17/2006  Job:  [id number]

## 2010-06-24 NOTE — H&P (Signed)
[name], [name]              ACCOUNT NO.:  [id number]   MEDICAL RECORD NO.:  [id number]          PATIENT TYPE:  INP   LOCATION:  A336                          FACILITY:  [facility]   PHYSICIAN:  Tilda Burrow, M.D. DATE OF BIRTH:  17-Mar-1985   DATE OF ADMISSION:  03/28/2008  DATE OF DISCHARGE:  LH                              HISTORY & PHYSICAL   ADMISSION DIAGNOSIS:  Recurrent right lower quadrant pain of severe  nature.   HISTORY OF PRESENT ILLNESS:  This 25 year old female is seen again in  the emergency room at [facility] on the evening of March 28, 2007, complaining of recurrent right lower quadrant abdominal pain.  The  patient has had intermittent episodes in January and February.  She was  seen in the emergency room for similar complaints on 25 January when  consultation with me was obtained and the patient had similar complaints  of right lower quadrant pain with a transabdominal ultrasound at that  time showing normal-sized ovaries, small uterus with 2 cm fundal  fibroid, small amount of free pelvic fluid with the patient afebrile.  Ultrasound was otherwise negative.  The patient had active bowel sounds  and CBC at that time showed white count 6300, hemoglobin 10, hematocrit  32 with urinalysis showing 0-2 red cells, 3-6 white cells on voided  specimen.  Unfortunately, the patient signed out AMA prior to final  disposition from the emergency room that day.  She returned on February  14 with similar complaints, at which time urinalysis showed specific  gravity of 1.025 with a large amount of blood present, positive for  nitrites and negative for leukocytes.  Wet prep was negative and CBC  showed a white count 6300 on that.  Urine culture was obtained which is  now returned with final report showing 10:5 E. coli which is sensitive  to Ancef, resistant to Cipro, sensitive to gentamicin and resistant to  Nitrofurantoin.  She was not treated at that time of that  admission.  Organisms also sensitive to ampicillin.  She returns on February 16  complaining of similar complaints with afebrile again,  urinalysis at  this time again positive for nitrates and negative for blood with CBC  showing white count of 6600, hemoglobin 11, hematocrit 33.  Due to the  severe nature of the pain, she is admitted for pain management.  Due to  the positive culture, she will be initiated on Ancef 1 gram IV q.6 h.  Renal ultrasound will be obtained to see if there is a movement of the  known kidney stones.   PAST MEDICAL HISTORY:  Positive history of tonsillitis, asthma, obesity  and history of ovarian cyst.   SURGICAL HISTORY:  1. Appendectomy 3 years ago at time of laparoscopic Rt salpingectomy  2. Cholecystectomy by laparoscopic means.  3. Lithotripsy in the past as well as ovarian cystectomy.   SOCIAL HISTORY:  She is a non-drug user.  She does smoke and drinks  occasionally.   ALLERGIES:  IVP DYE CAUSES HIVES.  MORPHINE CAUSES ITCHING.  TORADOL  CAUSES ITCHING.  ZOFRAN  ALLEGEDLY CAUSES NAUSEA AND VOMITING.   PHYSICAL EXAMINATION:  VITAL SIGNS:  Temperature 97.4 orally, blood  pressure 121/77, pulse 98.  GENERAL:  This morning shows an uncomfortable appearing female, resting  on her left side because it is less uncomfortable.  CHEST:  Clear with the patient giving evidence of moderate to marked  discomfort.  ABDOMEN:  Bowel sounds which are present in all quadrants of low  frequency.  Shows guarding in the right lower quadrant with no  discernible rebound in the right lower quadrant.  She has equivocal  rebound tenderness in the left lower quadrant with no referred pain.   IMPRESSION:  1. Urinary tract infection.  2. Possible kidney stone.  3. Rule out migration of kidney stone.   PLAN:  Three-way abdomen to check position of stone.  Renal ultrasound  this a.m. and pelvic ultrasound to recheck ovarian cyst.      Tilda Burrow, M.D.   Electronically Signed     JVF/MEDQ  D:  03/28/2008  T:  03/28/2008  Job:  [id number]   cc:   [facility]

## 2010-06-24 NOTE — Op Note (Signed)
[name], [name]              ACCOUNT NO.:  [id number]   MEDICAL RECORD NO.:  [id number]          PATIENT TYPE:  AMB   LOCATION:  DAY                           FACILITY:  [facility]   PHYSICIAN:  Tilda Burrow, M.D. DATE OF BIRTH:  20-Nov-1985   DATE OF PROCEDURE:  DATE OF DISCHARGE:  07/30/2008                               OPERATIVE REPORT   PREOPERATIVE DIAGNOSES:  1. Chronic right lower quadrant pain.  2. Desire for elective permanent sterilization.  3. Status post right salpingectomy in the distant past.   POSTOPERATIVE DIAGNOSES:  1. Chronic right lower quadrant pain.  2. Desire for elective permanent sterilization.  3. Status post right salpingectomy in the distant past.   PROCEDURES:  Diagnostic laparoscopy, left tubal sterilization.   SURGEON:  Tilda Burrow, MD   ASSISTANT:  Amie Critchley, CST   ANESTHESIA:  General.   COMPLICATIONS:  None.   FINDINGS:  No evidence of intra-abdominal adhesions, normal-appearing  left tube, normal-appearing ovaries bilaterally.  No intra-abdominal  adhesions.  Normal anterior abdominal wall to palpation and  visualization. Prior right Salpingectomy   INDICATIONS:  A 25 year old female desiring permanent sterilization and  reassessment of internal organs status as she has suffered debilitating  right lower quadrant pain for months with evaluation today showing only  small kidney stones, not considered to be causing blockage.   DETAILS OF PROCEDURE:  The patient was taken to the operating room,  prepped and draped for lower abdominal surgery with time-out conducted  procedure plans confirmed.  An infraumbilical vertical 1 cm skin  incision was performed as well as a transverse suprapubic incision of  similar length.  A Veress needle was introduced carefully into the  intraperitoneal cavity through the umbilicus, water droplet test used to  confirm intraperitoneal location and pneumoperitoneum achieved under 4  mmHg  intra-abdominal pressure.  A 10 mm laparoscopic trocar was placed  under direct visualization through the umbilicus revealing no evidence  of trauma to internal organs.  Inspection of the pelvis was then  performed.  Photo #1 shows the pelvic structures within the bladder  dome.  Photo #2 shows right lower quadrant without any adhesions.  Photo  #3 shows the normal liver edge without any adhesions.  Photo #4 shows  the left side of the upper abdomen including the tranverse colon in the  left upper quadrant.  There is no adhesions in any of these sides.  Photo #5 shows the left side of the pelvis with normal-appearing tube  and ovary and uterus.  Photo #6 shows the mobile grossly normal-  appearing right ovary with evidence of recent ovulation on the right  ovary.  Photo #7 shows the uterosacral ligaments, essentially normal  without any evidence of endometriosis.   We then proceeded with tubal sterilization.  Per the suprapubic trocar,  which had been placed under direct visualization, attention was directed  to the left fallopian tube, which was identified to its fimbriated end,  elevated, and a Falope ring applied to the mid portion of the tube.  Percutaneous Marcaine solution 1% was then injected into the  incarcerated  knuckle of tube as well as the broad ligament beneath the  tube.  The old appendectomy scar was injected with Marcaine solution  percutaneously, being careful to infiltrate off the muscle and the  fascia layer.  The procedures was considered successfully completed.  A  120 mL of saline was introduced in the abdomen.  The abdomen deflated.  Laparoscopic trocar was removed, fascial closure of the umbilical site  was performed using 0 Vicryl x1 stitch at the fascia and then  subcuticular 4-0 Dexon at each umbilical and suprapubic site.  Sponge  and needle counts were correct.  The patient went to the recovery room  in stable condition.      Tilda Burrow, M.D.   Electronically Signed     JVF/MEDQ  D:  07/30/2008  T:  07/31/2008  Job:  [id number]   cc:   Dennie Maizes, M.D.  Fax: [phone]   [facility]
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
## 2010-06-24 NOTE — Discharge Summary (Signed)
[name], [name]              ACCOUNT NO.:  [id number]   MEDICAL RECORD NO.:  [id number]          PATIENT TYPE:  INP   LOCATION:  A319                          FACILITY:  [facility]   PHYSICIAN:  Tilda Burrow, M.D. DATE OF BIRTH:  1985-07-17   DATE OF ADMISSION:  07/23/2008  DATE OF DISCHARGE:  06/16/2010LH                               DISCHARGE SUMMARY   ADMITTING DIAGNOSES:  1. Chronic right lower quadrant pain.  2. Right costovertebral angle tenderness.  3. Rule out renal colic.  4. Desire for elective permanent sterilization.   DISCHARGE DIAGNOSES:  1. Chronic right lower quadrant pain, unchanged.  2. Renal colic, ruled out.  3. Persistent microhematuria attributed to bilateral kidney stones,      considered stable without evidence of obstruction.   DISCHARGE MEDICATIONS:  1. Hydrocodone 10/650, #40, one q.6 h. p.r.n. pain.  2. Ultram 50 mg q.6 h. as an outpatient.   Followup laparoscopy and tubal ligation to be planned for next week.   HOSPITAL SUMMARY:  This is a 25 year old female gravida 8, para 0-2-5-2  with a longstanding history of diffuse lower abdominal pain with  multiple negative workups, who is being considered for laparoscopic-  assisted vaginal hysterectomy to address the pain as well as provided  desired permanent sterilization.  This was scheduled for July 23, 2008.  The patient came in for admission, appearance of being absolutely  miserable with severe exacerbation of her chronic right lower quadrant  pain and associated right CVA tenderness.  She was afebrile.  Her white  count was normal.  She has a prior history of renal stones.  She has had  chronic complaint from the right side, dating back as far as her last  pregnancy 2 years ago in 2008 when a stent was placed on the right due  to severity of her pain.  During the pregnancy, this was taken out.  The  patient did not tolerate the additional pain of the ureteral stent.  Surgical history is  notable for an ectopic with right salpingectomy  performed in the past.  She has had an appendectomy in 2007 at [facility] for evaluation of similar right lower quadrant  pain.   MEDICATIONS:  Ultram and hydrocodone for the pain.   PHYSICAL EXAMINATION:  Pertinent for severe 3+ dramatic response to  right CVA percussion with bowel sounds normal.  There is no rebound, no  guarding, and pain is perceived in the right lower quadrant below her  old appendectomy scar.  White count is 5400, hemoglobin 11, hematocrit  32, and potassium 3.9.   HOSPITAL COURSE:  The patient was admitted for evaluation to rule out  renal colic.  Urinalysis was positive for red cells and hemoglobin but  negative for anything else.  She had a renal ultrasound.  CT of the  abdomen was cancelled due to concern over the multiple radiologic  procedures she has had over the past few years.  A flat plate of the  abdomen barely showed tiny renal stones bilaterally.  There was no  evidence of hydronephrosis.  Renal  ultrasound was negative for  obstruction or hydroureter.  The patient was kept in the hospital,  allowed her PCA pump.  She perceived the pain as being diminished to a  4/10 when using the PCA pump as compared to a 6/10 upon admission even  though her behavior was extremely dramatic.  She perceives her  longstanding chronic pain at home on oral medicines as being a 6/10 as  well.   IMPRESSION AT DISCHARGE:  There is inconsistency between her severity of  pain and her diagnostic findings but in an effort to clarify the  diagnosis, plans are to proceed with laparoscopic tubal sterilization in  the near future with assessment of the right adnexa photo documentation  with consent to be obtained for lysis of adhesions if encountered or  possible removal of right ovary if lymphoma and adhesions are  encountered.      Tilda Burrow, M.D.  Electronically Signed     JVF/MEDQ  D:   07/25/2008  T:  07/26/2008  Job:  [id number]   cc:   [facility]   Dennie Maizes, M.D.  Fax: [phone]

## 2010-06-24 NOTE — Consult Note (Signed)
[name], [name]              ACCOUNT NO.:  [id number]   MEDICAL RECORD NO.:  [id number]          PATIENT TYPE:  INP   LOCATION:  A336                          FACILITY:  [facility]   PHYSICIAN:  Dennie Maizes, M.D.   DATE OF BIRTH:  02/28/1985   DATE OF CONSULTATION:  03/28/2008  DATE OF DISCHARGE:                                 CONSULTATION   ATTENDING PHYSICIAN:  Dr. Christin Bach   REASON FOR CONSULTATION:  Right lower quadrant abdominal pain, history  of urolithiasis, urinary tract infection.   CONSULTATION REPORT:  A 25 year old female is known to me from prior  evaluation and treatment.  She has history of recurrent urolithiasis.  She underwent ESL of renal calculus in 2005.  She was seen in 2008 with  right hydronephrosis and flank pain.  She was pregnant at that time.  Cystoscopy with right ureteral stent placement were done at that time.   The patient has passed a few stones since that time.  She had been  having intermittent right lower abdominal pain of moderate severity.  She had been to the emergency room on several occasions.  She has been  evaluated with noncontrast CT scan of abdomen and pelvis, plain x-ray of  abdomen as well as a renal ultrasound.  The noncontrast CT scan of  abdomen and pelvis done on March 25, 2008, revealed multiple  bilateral small renal calculi without obstruction.  A right adnexal mass  was noted.  This has been further evaluated with transvaginal as well as  pelvic ultrasound.  There is no evidence of infection according to the x-  ray report.  The patient had the urinary tract infection due to more  than 100,000 colonies of E. coli.  She has been treated with IV  Rocephin.  The organism is sensitive to Rocephin.  The patient continues  to have intermittent right lower quadrant abdominal pain.  Further  evaluation has been done with a renal ultrasound.  This revealed  bilateral renal calculi.  The right renal collecting system on the  ureter is dilated up to 8.7 mm.  Point of obstruction was not clear on  the renal ultrasound.  I was asked to see the patient for further  evaluation.   She complains of low-grade fever, urinary frequency x6 to 8 and nocturia  x3 to 4.  There is no history of dysuria or hematuria.   PAST MEDICAL HISTORY:  1. History of recurrent urolithiasis.  2. Status post cholecystectomy.  3. Status post appendectomy.  4. Status post lithotripsy in 2005.  5. Status post ovarian cystectomy.   OTHER MEDICAL PROBLEMS:  Include tonsillitis, asthma, obesity, history  of ovarian cyst.   ALLERGIES:  1. IV CONTRAST.  2. MORPHINE.  3. TORADOL.  4. ZOFRAN.   PHYSICAL EXAMINATION:  GENERAL:  The patient is in discomfort.  ABDOMEN:  Soft.  No palpable flank mass or CVA tenderness.  Mild right  lower quadrant abdominal tenderness is noted.  No rebound tenderness or  rigidity.  Bladder not palpable.   LABORATORIES:  CBC:  WBC 6.6, hemoglobin 11.3, hematocrit 33.5.  Urinalysis at time of admission revealed large blood, nitrate positive,  leukocyte esterase negative, microscopic WBC 3-6/high-power field, RBCs  21-50/high-power field, bacteria many.  Urine culture and sensitivity  revealed more than 100,000 colonies of E. coli sensitive to Rocephin.  BUN 8, creatinine 0.61.  Repeat urinalysis on March 27, 2008,  revealed blood negative, nitrites positive.  Microscopic examination:  WBC 7-10/high-power field, RBC 0-2, bacteria rare.   IMPRESSION:  Urinary tract infection due to Escherichia coli, bilateral  small nonobstructing renal calculi, right hydronephrosis, cause unknown.   PLAN:  The patient's right lower quadrant abdominal pain etiology is  unknown.  She has had seven CT scans in the past year.  I do not plan to  repeat the CT scan.  I plan to discuss with the radiologist about  further evaluation.  I have discussed with Dr. Emelda Fear about the  patient's management.   Thanks for this  consult.      Dennie Maizes, M.D.  Electronically Signed     SK/MEDQ  D:  03/28/2008  T:  03/28/2008  Job:  [id number]

## 2010-06-24 NOTE — Consult Note (Signed)
[name], TIMOTHY              ACCOUNT NO.:  [id number]   MEDICAL RECORD NO.:  [id number]          PATIENT TYPE:  INP   LOCATION:  A319                          FACILITY:  [facility]   PHYSICIAN:  Dennie Maizes, M.D.   DATE OF BIRTH:  19-Jan-1986   DATE OF CONSULTATION:  07/24/2008  DATE OF DISCHARGE:                                 CONSULTATION   REASON FOR CONSULTATION:  Right lower quadrant abdominal pain, chronic  history of urolithiasis.   HISTORY OF PRESENT ILLNESS:  This 22-year female is known to me from  prior evaluation and treatment.  She has history of recurrent  urolithiasis.  She has undergone ESL of renal calculus in 2005.  During  her pregnancy in 2008, she was evaluated for right flank pain and also  with right hydronephrosis.  She underwent cystoscopy and right ureteral  stent placement.  The stent was subsequently removed.  She has past few  stones since that time.  She has been having chronic and intermittent  right lower quadrant pain of moderate severity.  She has been to  emergency room on several occasions.  She has been evaluated by 13 CT  scans of the abdomen and pelvis for the past few years.  He also had  several ultrasound examination of the kidneys.  Noncontrast CT scan of  abdomen and pelvis revealed a bilateral multiple small renal calculi  without obstruction.  The patient is scheduled to undergo abdominal  hysterectomy.  She was found to have severe right lower quadrant and  flank pain and surgery has been postponed.  She had been admitted to the  hospital now for further evaluation.  She denied having fever, chills,  or hematuria at present.   PAST MEDICAL HISTORY:  History of urolithiasis, status post ESL, status  post cholecystectomy, appendectomy, status post right ovarian  cystectomy.   MEDICAL PROBLEMS:  Include tonsillitis, asthma, obesity, history of  ovarian cyst.   ALLERGIES:  She is allergic to IV CONTRAST, MORPHINE, TORADOL, and  ZOFRAN.   PHYSICAL EXAMINATION:  GENERAL:  The patient is in discomfort.  ABDOMEN:  Soft.  No palpable flank mass or CVA tenderness.  Mild right  lower quadrant abdominal tenderness is noted.  Tenderness is noted on  the appendectomy scar.  No rebound, tenderness, or rigidity.  Bladder  not palpable.   LABORATORY DATA:  CBC WBC 5.1, hemoglobin 7.2, hematocrit 32.3.  BMET,  BUN 4 creatinine 0.63.  Urinalysis blood large, nitrate negative,  leukocyte esterase negative, RBCs 7-10 per high-power field.   She was evaluated, x-ray of the KUB area as well as renal ultrasound.  Renal ultrasound revealed no evidence of obstruction, renal mass, or  hydronephrosis.  The plain x-rays revealed a faint small bilateral renal  calculi which the details are obscured by gas shadows.   IMPRESSION:  Bilateral nonobstructing small renal calculi,  microhematuria.   PLAN:  1. I feel the patient's pain is not arising in the urinary tract.      Stones are very small and they are not obstructing.  2. Her microhematuria might be  secondary  to the multiple kidney      stones.  3. I discussed with Dr. Emelda Fear about management options.  Urologic      intervention for this nonobstructing small stones are not needed at      this time.  The patient had a good chance of passing the stones.      If she develops obstruction and/or acute renal colic, I will      consider intervention at that time.      Dennie Maizes, M.D.  Electronically Signed     SK/MEDQ  D:  07/24/2008  T:  07/25/2008  Job:  [id number]   cc:   Tilda Burrow, M.D.  Fax: [phone]

## 2010-06-24 NOTE — Discharge Summary (Signed)
NAMESAVON, COBBS              ACCOUNT NO.:  [id number]   MEDICAL RECORD NO.:  [id number]          PATIENT TYPE:  INP   LOCATION:  A415                          FACILITY:  [facility]   PHYSICIAN:  Tilda Burrow, M.D. DATE OF BIRTH:  1985/02/14   DATE OF ADMISSION:  07/16/2006  DATE OF DISCHARGE:  06/08/2008LH                               DISCHARGE SUMMARY   ADMITTING DIAGNOSES:  1. Pregnancy at 8 months gestation.  2. Chronic recurrent right lower quadrant pain not related to      pregnancy, rule out kidney stone, rule out malingering.   DISCHARGE DIAGNOSES:  1. Pregnancy at 8 months gestation.  2. Right lower quadrant pain not related to pregnancy.  No evidence of      kidney stone.  Uncertain etiology of the pain.  3. Yeast vaginitis.  4. Possible urinary tract infection.   DISCHARGE MEDICATIONS:  1. Phenergan 25 mg, 15 tablets, 1 q.6 h. p.r.n. nausea.  2. Terazol 7, 1 tube, use at bedtime for yeast.  3. Macrobid 100 mg b.i.d. x1 week.   FOLLOWUP:  One week, Dr. Susanne Borders of [place], [place].   HOSPITAL SUMMARY:  This 25 year old female, gravida 7 para 1 AB 5, was  admitted for assessment of right lower quadrant pain symptoms.  Once  again, the patient is receiving fragmented pregnancy care.  See HPI for  details.  Medical history is significant in that in August 2007 She had  a laparoscopy, right salpingectomy, and appendectomy performed by me for  almost identical appearance.  The appendix was completely normal, and  the small hydrosalpinx was not considered to be likely to be a clinical  problem.  This pregnancy has been notable for care here with an  admission at approximately 15 weeks to [redacted] weeks pregnant for a couple  days for similar complaints.  Subsequent care at [facility].  Transferred to Dr. Dianne Dun, and now she is considering  moving back to [place].  Urine drug screen is negative for illicit  substances.   HOSPITAL COURSE:   Further notable for the patient remaining afebrile,  with hemoglobin 9.1, hematocrit 26.1, white count 8800.  Electrolytes  normal.  BUN 3, creatinine 0.49.  EGFR greater than 60.  Urine  microscopic shows a few yeast in the urinalysis, with leukocyte esterase  positive, but negative nitrites and trace hemoglobin.  Specific gravity  is good.  The urine is somewhat cloudy.  Cultures pending at this time,  but negative at 24 hours.   PLAN:  Discharged home on antiemetics.   Throughout the hospitalization, the patient complained of intermittent  episodes of pain in the right lower quadrant for which she would close  her eyes, breathe slowly, but with pursed lips, as if in pain.  There  were no blood pressure changes or pulse increases.  There was no CVA  tenderness.  The validity of her complaints could not be fully  established.  Therefore, after 2 days' observation, with no  deterioration in condition, no fever, and essentially negative workup,  including a renal ultrasound that was negative, we  are returning her to  Dr. Marcheta Grammes care for the duration of the pregnancy.  The patient is  advised to follow up with Dr. Dianne Dun within the week, and to return  here p.r.n. worsening of condition.      Tilda Burrow, M.D.  Electronically Signed     JVF/MEDQ  D:  07/18/2006  T:  07/19/2006  Job:  [id number]   cc:   Susanne Borders  Fax: [phone]   [facility]

## 2010-06-24 NOTE — Discharge Summary (Signed)
[name], GARIN              ACCOUNT NO.:  [id number]   MEDICAL RECORD NO.:  [id number]          PATIENT TYPE:  INP   LOCATION:  A336                          FACILITY:  [facility]   PHYSICIAN:  Tilda Burrow, M.D. DATE OF BIRTH:  26-Mar-1985   DATE OF ADMISSION:  03/28/2008  DATE OF DISCHARGE:  02/19/2010LH                               DISCHARGE SUMMARY   ADMITTING DIAGNOSIS:  Recurrent right lower quadrant pain severe in  nature.   DISCHARGE DIAGNOSES:  1. Recurrent right lower quadrant pain, unclear etiology.  2. Escherichia coli, urinary tract infection.  3. History of recurrent kidney stones bilaterally.  4. Status post laparoscopic appendectomy.  5. Status post right salpingectomy for right lower quadrant pain in      the past.   DISCHARGE MEDICATIONS:  1. Keflex 500 mg  1 q.i.d. x7 days.  2. Dilaudid 2 mg #40 : 1 p.o. q.6 h. p.r.n. pain.  3. Phenergan 25 mg 15 tablets 1 p.o. q.6 h. p.r.n. pain refill x1.   Followup 4 days to office for transvaginal ultrasound.   HOSPITAL SUMMARY:  This 25 year old female once again seen through the  emergency room for recurrent right lower quadrant pain with similar  intermittent episodes in January and February, see HPI and multiple  prior ER visits.  In addition, the history noted additional concerns  should be noted that in 2007, she had laparoscopic removal of  hydrosalpinx with ovary preserved and normal at that time.  Incidental  appendectomy was performed at that time to help and to rule out  diagnostic confusion.  Additionally, the patient had a pregnancy in 2008  that was complicated by chronic recurrent right lower quadrant pain for  which we thought the stones might be a contributory factor.  She had a  urethral stent placed by Dr. Jerre Simon  and tolerated the stent even less  well than  the perceived stones.  Surgical history is also notable for  laparoscopic cholecystectomy as well as lithotripsy in the past.   ALLERGIES:   IVP dye causes hives,  MORPHINE causes itching, and  TORADOL causes itching.  She has lately been told, she has nausea and vomiting in responses to  [facility] through that seems unlikely.   HOSPITAL COURSE:  The patient was afebrile on admission had complaints  of moderate severe pain in point area in the right lower quadrant just  around the McBurney's point.  She has had both ultrasound of the pelvis  with no adnexal masses in the seen in the right ovary is 2.2 x 1.5 x 2.6  cm with tiny ovarian follicle.  She has renal ultrasound, which showed  some slight dilation of the right kidney with no dilation of the right  ureter with no evidence of obstruction identified.  There is no fullness  of left collecting system at all.  There is no masses associated with  the bladder.  She has small bilateral renal calculi.   The patient was then seen for 2 days, consult with Dr. Rito Ehrlich who does  not believe that the minimal ureteral dilation of 8.7 mm  indicates a  renal stone.  Of note, the admission urinalysis is entirely negative for  blood.  The patient was also seen by Dr. Lovell Sheehan and decision was made  that continue observation for now is more prudent than proceeding toward  laparoscopy and probable removal of tube and ovary.  The patient  additionally states that she wanted all out and that will require  meaning hysterectomy and/or removal of right tube and ovary, and the  treatment of the UTI  Continued evaluation as an outpatient will allow for additional  assessment of whether this was an appropriate management plan.  Followup will be on Tuesday in our office 9 a.m. for transvaginal  ultrasound by me and followup plans as decided at that time.      Tilda Burrow, M.D.  Electronically Signed     JVF/MEDQ  D:  03/30/2008  T:  03/31/2008  Job:  [id number]

## 2010-06-27 NOTE — Consult Note (Signed)
NAME:  [name], MCMEANS NO.:  [id number]   MEDICAL RECORD NO.:  [id number]           PATIENT TYPE:  OBV   LOCATION:  A427                          FACILITY:  [facility]   PHYSICIAN:  Dennie Maizes, M.D.   DATE OF BIRTH:  [birth year]   DATE OF CONSULTATION:  04/05/2006  DATE OF DISCHARGE:                                 CONSULTATION   REASON FOR CONSULTATION:  Severe right flank pain, possible ureteral  calculi.   CONSULTATION REPORT:  This 25 year old female has a past history of  recurrent urolithiasis.  She has had kidney stones since 2005.  She has  undergone lithotripsy of renal calculus in 2005.  She is 20 weeks'  pregnant at present.  She has been having severe right flank pain  radiating to the front for the past 2 weeks, she has been passing gravel-  like material in the urine.  She has been admitted to the hospital for  pain control and further treatment.  A renal ultrasound revealed a no  evidence of hydronephrosis or calcifications in the kidneys.  I was  asked to see the patient for a possible cystoscopy and stent placement.  She denied having any fever, chills, voiding difficulty, or gross  hematuria at present.   PAST MEDICAL HISTORY:  1. Status post cholecystectomy.  2. History of recurrent urolithiasis.  3. Status post ESL.  4. Status post excision of ovarian cysts.  5. Status post appendectomy.   ALLERGIES:  She is allergic to IV CONTRAST, MORPHINE and TORADOL.   PHYSICAL EXAMINATION:  GENERAL:  The patient is noted to be in severe  pain.  ABDOMEN:  Soft.  No palpable flank mass, moderate right costovertebral  angle tenderness is noted.  The patient is [redacted] weeks pregnant.   ADMISSION LABS:  BUN 9, creatinine 0.583.  CBC WBC 7.5, hemoglobin 9.8,  hematocrit 29.1. Urinalysis, hemoglobin moderate, nitrate negative,  leukocyte esterase small, bacteria rare.   PLAN:  1. Urine culture and sensitivity, today, to discuss with the patient  regarding management options.  Explained to her about observation      for passage of the calculus as well as cystoscopy, right ureteral      stent placement with ultrasound guidance under anesthesia.  The      patient has been having severe pain; and she wants to proceed with      stent placement.  I have discussed with her regarding the      diagnosis, operative details, alternative treatments, outcome,      possible risks and complications; and she has agreed for the      procedure to be done.  Stent      replacement as well as stent removal have been explained to the      patient.  I have informed her that some patients  may not tolerate      the stent well; and the stent would need to be removed at a later      date.  Her questions were answered; and she has agreed for the      procedure  to be done.      Dennie Maizes, M.D.  Electronically Signed     SK/MEDQ  D:  04/05/2006  T:  04/05/2006  Job:  [id number]   cc:   Tilda Burrow, M.D.  Fax: [phone]

## 2010-06-27 NOTE — H&P (Signed)
[name], [name]              ACCOUNT NO.:  [id number]   MEDICAL RECORD NO.:  [id number]          PATIENT TYPE:  INP   LOCATION:  A417                          FACILITY:  [facility]   PHYSICIAN:  Tilda Burrow, M.D. DATE OF BIRTH:  09/06/1985   DATE OF ADMISSION:  09/20/2005  DATE OF DISCHARGE:  LH                                HISTORY & PHYSICAL   ADMISSION DIAGNOSES:  1. Recurrent intermittent severe right lower quadrant pain, suspected      ovarian or tubal in origin.  2. History of right hydrosalpinx, not improved after prior incision and      drainage of hydrosalpinx.  3. History of right ovarian cystectomy, December 2006.  4. History of multiple kidney stones.  5. Vaginal yeast infection.   HISTORY OF PRESENT ILLNESS:  This 25 year old female, gravida 5, para 1, AB  4 with 3 D&C's is admitted at this time after presenting to the emergency  room in the early morning hours of September 20, 2005 complaining of acute  exacerbation of her right lower quadrant pain which has been going on for  three days.  She has been seen in the emergency room several times for  similar pain.  She has had one episode involving the left side.  Her last  menstrual period was in June perceived as normal menses beginning on the  22nd.  She has irregular cycles and it is not unusual for her to be this  late.  She has an extensive history of right lower quadrant pain.  She has  been evaluated as follows:  She has had multiple ER visits in August 2006  and since the subsequent at [facility] for abdominal pain.  She has  also been seen in [facility] March 2007 and April 2007 for similar  complaints.  Evaluation have been as follows:  Additionally, she has had  evaluations in [place].  She considers Dr. Iris Pert of [facility] as her  primary care physician and she had an appointment scheduled in October 11, 2005 with Dr. Christain Sacramento, a gynecologist in [place].   Recent visits for abdominal  pain include the following:  September 23, 2004,  abdominal pain; October 12, 2004, abdominal pain; September 2006, PID;  November 2006, dysuria; December 2006, ovarian cyst at [facility].  She was subsequently seen in [facility] and admitted.  She had  ovarian cystectomy performed laparoscopically by Dr. Everardo All.  She was seen  again February 2007 at [facility] for abdominal pain.  She was seen  March 2007 at [facility] and was admitted to rule out tubo-ovarian  abscess.  Evaluation included the identification of gallstones and she  underwent laparoscopic cholecystectomy in March.  In March, she was also  seen in the emergency room there for pain and passage of a kidney stone.  She has a longstanding history of kidney stones.  In April, she was also  seen for ovarian cyst.  She underwent laparoscopy by Dr. Kizzie Bane to where he  identified a right hydrosalpinx.  He attempted to preserve the right ovary  and  so he did a drainage procedure to decompress the right hydrosalpinx.  The patient was afebrile during this time but had received antibiotic  therapy as precaution.  The tube and ovary were, however, salvaged.   At this time, she presents with an acute worsening overnight of her three  days of abdominal pain.  She is afebrile.  She denies recent sexual  activity.   PAST MEDICAL HISTORY:  No chronic illnesses.   PAST SURGICAL HISTORY:  1. D&C x3.  2. Laparoscopic cholecystectomy April 16, 2005 at [facility].  3. Laparoscopic ovarian cystectomy at [facility] December 2006.  4. Diagnostic laparoscopy and drainage of right hydrosalpinx March 2007 at      [facility].   PHYSICAL EXAMINATION:  GENERAL:  Shows a dramatically uncomfortable African-  American female, alert and oriented x3.  HEENT:  Pupils equal, round and reactive.  NECK:  Supple.  CARDIOVASCULAR:  Unremarkable.  BREASTS:  Deferred.  ABDOMEN:  Bowel sounds active in all  quadrants.  Normal in pitch without any  rushes or evidence of obstruction.  Abdomen is normal without distension and  there is a well-healed surgical scars.  She has distinct tenderness in the  right lower quadrant without rebound.  PELVIC:  Nontender and normally appearing external genitalia.  The vagina  has an extensive severe yeast infection ongoing.  Cervix is nonpurulent with  clear midcycle mucus present, suggestive of an ovulation.  Vaginal side wall  was perceived as tender (this is usually an indication of some element of  emotional overlay to pain complaints).  Interestingly, the patient did  require an extra day after her hospitalization for drainage of the  hydrosalpinx.  This was considered to be due to the patient's poor pain  tolerance.  EXTREMITIES:  Grossly normal.  Cervix is slightly tender to manipulation but  there is no guarding to cervical motion manipulation.   IMPRESSION:  Chronic right lower quadrant pain seemingly attributable to  gynecologic source with documented right hydrosalpinx.  As in keeping with  the patient's preference, we will move aggressively to assist her with her  pain complaints.  Plan is to perform laparoscopic evaluation in the a.m.  with plans to remove the right tube given the history of old hydrosalpinx.  Additionally, we will assess the right ovary and if it needs to be removed  we will do so if scarring is indeed present.  Plan is to do appendectomy as  well to reduced diagnostic uncertainty in the future.  We will make every  effort to preserve future fertility by preserving the left tube and ovary  unless distinct abnormalities are identified.  The patient is content with  one child.  Says she does not want anymore but after some discussion which  started with her stating that she wanted a hysterectomy, we came to the  agreement that ovarian and preservation of the uterus would be desirable.   PLAN:  Laparoscopic removal of right  tube, possible removal of right ovary  with incidental appendectomy to be performed September 21, 2005.      Tilda Burrow, M.D.  Electronically Signed     JVF/MEDQ  D:  09/20/2005  T:  09/20/2005  Job:  [id number]   cc:   [facility]   Hassie Bruce  Fax: [phone]   Cleophus Molt  Fax: [phone]

## 2010-06-27 NOTE — Op Note (Signed)
NAMEAVALIE, OCONNOR              ACCOUNT NO.:  [id number]   MEDICAL RECORD NO.:  [id number]          PATIENT TYPE:  OBV   LOCATION:  A427                          FACILITY:  [facility]   PHYSICIAN:  Dennie Maizes, M.D.   DATE OF BIRTH:  28-Oct-1985   DATE OF PROCEDURE:  04/06/2006  DATE OF DISCHARGE:                               OPERATIVE REPORT   PREOPERATIVE DIAGNOSIS:  Right renal colic, pregnancy.   POSTOPERATIVE DIAGNOSIS:  Right renal colic, pregnancy.   OPERATIVE PROCEDURE:  Cystoscopy, right ureteral stent placement under  ultrasound guidance.   ANESTHESIA:  Spinal.   SURGEON:  Dennie Maizes, M.D.   COMPLICATIONS:  None.   DRAINS:  6.26 cm size right ureteral stent.   INDICATIONS FOR PROCEDURE:  This 25 year old female has a past history  of urolithiasis.  She is [redacted] weeks pregnant.  She was admitted to  hospital severe right flank pain.  She had persistent pain.  She was  taken to the OR today for cystoscopy and right ureteral stent placement  with ultrasound guidance.   DESCRIPTION OF PROCEDURE:  Spinal anesthesia was induced and the patient  was placed on the OR table in the dorsal lithotomy position.  The lower  abdomen and genitalia were prepped and draped in a sterile fashion.  Cystoscopy was done with a 25-French scope.  The appearance of bladder  was normal.  The trigone, ureteral orifice and bladder mucosa were  unremarkable.  A 5-French open-ended catheter was then placed the right  distal ureter.  A 0.038-inch Bentson guidewire was then advanced into  the right renal pelvis with ultrasound guidance.  The tip of the  guidewire could be seen in the renal pelvis.  The open-ended catheter  was then removed.  A 77F 26 cm size stent was  then placed over the guidewire and pushed into the right collecting  system.  The upper end of the stent could be seen inside the renal  pelvis.  The instruments were removed.  The patient was transferred to  the PACU in a  satisfactory condition.      Dennie Maizes, M.D.  Electronically Signed     SK/MEDQ  D:  04/06/2006  T:  04/06/2006  Job:  [id number]   cc:   Tilda Burrow, M.D.  Fax: [phone]

## 2010-06-27 NOTE — Discharge Summary (Signed)
[name], [name]              ACCOUNT NO.:  [id number]   MEDICAL RECORD NO.:  [id number]          PATIENT TYPE:  INP   LOCATION:  A427                          FACILITY:  [facility]   PHYSICIAN:  Lazaro Arms, M.D.   DATE OF BIRTH:  10-02-1985   DATE OF ADMISSION:  04/01/2006  DATE OF DISCHARGE:  02/28/2008LH                               DISCHARGE SUMMARY   DISCHARGE DIAGNOSES:  1. Intrauterine pregnancy at [redacted] weeks gestation.  2. Right flank pain with history of right ureteral calculus.  3. No prenatal care to this point.   Please refer to the history and physical for details of admission to the  hospital.   HOSPITAL COURSE:  The patient was admitted with presumptive right renal  and/or ureteral calculus.  Ultrasound was ordered and was negative for  definitive stone.  She was found to have an estimated date of delivery  of 08/23/2006 giving estimated gestational age of [redacted] weeks and 4 days on  the day of admission.  The patient has suffered with kidney stones in  the past and in fact has had lithotripsy.  She came in with a large  amount of blood and she was given progressive IV hydration and narcotics  and strained her urine which produced no stone, just some gritty  changes.  She also had a chest x-ray which just shows some mild  bronchitic changes. She was given IV Zithromax.  She really got no  better over the weekend and Dr. Rito Ehrlich was consulted and he felt as if  a right ureteral stent would be appropriate in her management.  As a  result he took her to the OR on April 06, 2006 and placed a right  ureteral stent.  Postop she has had diminished pain.  No evidence of  still any passage of a stone and he did not appreciate a stone at the  time of her stent placement.  Her white count is normal.  She is anemic.  She was placed on vitamins and iron from the office. She has no CVA  tenderness on the morning of discharge.  Her exam is benign.   As a result she is  discharged to home on Keflex prophylactically for the  next 10 days per Dr. Rito Ehrlich.  He is also given Tylox prescription for  pain.  She will be followed up by Dr. Rito Ehrlich in 2 weeks.  She will  follow up in our office in one week.      Lazaro Arms, M.D.  Electronically Signed     LHE/MEDQ  D:  04/08/2006  T:  04/08/2006  Job:  [id number]

## 2010-06-27 NOTE — Discharge Summary (Signed)
NAMESAVITA, RUNNER              ACCOUNT NO.:  [id number]   MEDICAL RECORD NO.:  [id number]          PATIENT TYPE:  INP   LOCATION:  A417                          FACILITY:  [facility]   PHYSICIAN:  Tilda Burrow, M.D. DATE OF BIRTH:  Nov 14, 1985   DATE OF ADMISSION:  09/20/2005  DATE OF DISCHARGE:  08/15/2007LH                                 DISCHARGE SUMMARY   ADMISSION DIAGNOSIS:  1. Chronic right lower quadrant pain, possible GYN etiology.  2. History of right hydrosalpinx.  3. History of right ovarian cystectomy.  4. History of multiple kidney stones.  5. Vaginal yeast infection.   DISCHARGE DIAGNOSIS:  1. Severe intermittent right lower quadrant pain, right hydrosalpinx.  2. Vaginal yeast infection.   PROCEDURE:  Diagnostic laparoscopy, right salpingectomy, appendectomy,  September 22, 2005.   DISCHARGE MEDICATIONS:  Flagyl 500 mg p.o. b.i.d., Terazol vaginal p.r.n.,  Tylox for pain.   DISCHARGE INSTRUCTIONS:  Follow up in one week with [facility].   HOSPITAL SUMMARY:  This 25 year old female gravida 5, para 1, AB4, with  three D&Cs for miscarriage, was admitted for emergency room  care  complaining of acute exacerbation of chronic right lower quadrant pain of  three days duration.  See extensive HPI.  Significant history is the patient  had a laparoscopic ovarian cystectomy in [place] in December 2006 and  diagnostic laparoscopy and drainage of right hydrosalpinx March 2007 at  [facility].   The patient was admitted with attempts at diagnosis.  Review of her old  records shows admission and multiple emergency room  visits to [facility]  and [facility]  She has had eight visits to the emergency room in  the last year to [facility] and three to [facility].  She had the  laparoscopy for right hydrosalpinx drainage last March.  After a couple of  days of evaluation with only minimal improvement in her discomfort levels  and an, otherwise,  negative workup including ultrasound of the abdomen, she  was taken to the operating room for removal of the right hydrosalpinx and  assessment of the pelvis.  Laparoscopy showed an essentially normal pelvis,  no evidence of endometriosis, with a small right hydrosalpinx less than 1 cm  in diameter.  She had a small ovulation stigma on the left ovary.  In order  to clarify diagnostic confusion, appendectomy was performed as well as the  salpingectomy.  The right ovary was specifically noted to be grossly normal.   Postoperatively, the patient was observed overnight, was afebrile,  tolerating diet, bowel sounds present, and was discharged home for follow up  in one week for incision check in the office of [facility].   It should be noted that the patient's abdomen really looked quite normal  other than the small hydrosalpinx, the left tube specifically looked fine  with fimbria visible.  Tubal dye studies were not performed.  Additionally,  the patient expressed comments during her hospital course that preservation  of fertility is not a requirement and that a hysterectomy, if it became  necessary, would not be a  problem for her.      Tilda Burrow, M.D.  Electronically Signed     JVF/MEDQ  D:  10/06/2005  T:  10/07/2005  Job:  [id number]   cc:   Hassie Bruce  Fax: [phone]

## 2010-06-27 NOTE — Op Note (Signed)
[name], [name]              ACCOUNT NO.:  [id number]   MEDICAL RECORD NO.:  [id number]          PATIENT TYPE:  INP   LOCATION:  A417                          FACILITY:  [facility]   PHYSICIAN:  Tilda Burrow, M.D. DATE OF BIRTH:  11-03-1985   DATE OF PROCEDURE:  09/22/2005  DATE OF DISCHARGE:                                 OPERATIVE REPORT   PREOPERATIVE DIAGNOSIS:  Persistent right lower quadrant pain, history of  right hydrosalpinx.   POSTOPERATIVE DIAGNOSIS:  Persistent right lower quadrant pain, small right  hydrosalpinx.   PROCEDURE:  Diagnostic arthroscopy, laparoscopic right salpingectomy,  incidental appendectomy.   SURGEON:  Tilda Burrow, M.D.   ASSISTANTMarlana Salvage, C.S.T.   ANESTHESIA:  General.   COMPLICATIONS:  None.   FINDINGS:  Normal abdominal cavity anatomy. Small right hydrosalpinx without  any evidence of erythema at this time. Felt unlikely to be a source of her  pain but removed due to its intrinsic abnormality and nonfunctional nature.   Normal-appearing left tube. Evidence of recent ovulation on the left ovary.  Normal-appearing appendix, easily removed.   DETAILS OF PROCEDURE:  The patient was taken to the operating room, prepped  and draped for combined abdominal and vaginal procedure. Hulka tenaculum was  then placed as well as a Foley catheter. Infraumbilical vertical 1-cm  incision was made as well as a transverse suprapubic incision. Veress needle  was introduced through the umbilicus. Water droplet test used to confirm  intraperitoneal location, and then the pneumoperitoneum achieved under 3  liters CO2 under normal pressures. Laparoscopic trocar was introduced  through the umbilicus using laparoscopic guidance with easy entry into the  peritoneal cavity. Visualization of the lower abdomen showed no evidence of  trauma to the internal organs. Photo #1 shows the pelvic surface is  encountered, and there was no erythema, purulence or  exudate. Suprapubic  trocar was introduced, a 5-mm trocar, and then a right lower quadrant 5-mm  trocar placed as well. Manipulation of these structures revealed the  previously mentioned normal left tube, evidence of IV Lasix on the left, no  evidence of endometriosis, right hydrosalpinx and completely normal-  appearing right ovary without adhesions. The appendix was then easily  visible on the right, easily accessible, and no erythema noted. Bowel  surfaces were smooth, glistening, without abnormality. Right upper quadrant  was photo documented  as well to confirm it to be at complete absence of any  adhesions, inflammation, or visible abnormalities. Attention was directed to  the pelvis again. Decision was made to proceed as previously agreed to  salpingectomy and appendectomy. The right ovary was considered completely  normal, and removal was not felt to be likely to help her. The harmonic  scalpel was used to perform a right salpingectomy with ease. The harmonic  scalpel was used to take down the mesoappendix, denuding the appendix at the  appendiceal stump where an endo GIA could be cross clamped across the  appendiceal stump, fired, and the appendix extracted using EndoCatch. Photos  were taken at the close of the procedure to confirm satisfactory completion.  Saline  irrigation of the abdomen was performed. The patient received  antibiotic prophylaxis perioperatively. The patient went to the recovery  room in good condition. Sponge and needle counts were correct. After we  deflated the abdomen, closed the right lower quadrant 12-mm port, and the  umbilical 10-mm port at the fascial level with 0 Vicryl and closed all 3  ports with subcuticular 4-0 Dexon and Steri-Strips. The patient went to  recovery room in stable condition.      Tilda Burrow, M.D.  Electronically Signed     JVF/MEDQ  D:  09/22/2005  T:  09/23/2005  Job:  [id number]   cc:   Hassie Bruce  Fax:  [phone]   Cleophus Molt  Fax: [phone]

## 2010-06-27 NOTE — H&P (Signed)
NAME:  [name], [name]              ACCOUNT NO.:  [id number]   MEDICAL RECORD NO.:  [id number]          PATIENT TYPE:  OBV   LOCATION:  A427                          FACILITY:  [facility]   PHYSICIAN:  Tilda Burrow, M.D. DATE OF BIRTH:  [birth year]   DATE OF ADMISSION:  04/01/2006  DATE OF DISCHARGE:  LH                              HISTORY & PHYSICAL   CHIEF COMPLAINT:  Right flank pain, unregistered pregnancy 5- 6 months,  bronchitis.   HISTORY OF PRESENT ILLNESS:  This 25 year old female, last menstrual  period late September with no prenatal care to date is admitted via the  emergency room with acute onset of right flank pain radiating down into  the right inguinal area.  She is approximately 5 months pregnant with no  prenatal care to date.  She has also had 3 weeks of chronic cough with  bronchitis but denies fever.  She is admitted for suspected right renal  stone.  She has a history of stones in the past and has required  lithotripsy in the past at [facility].  She is admitted with a hemoglobin,  hematocrit, etc., as described in the ER notes.  We will get a renal  ultrasound this morning, also get an obstetric ultrasound, chest x-ray  to confirm the bronchitic breath sounds and begin IV azithromycin.  Follow up in our office after treatment and resolution of stone issues.   VITAL SIGNS:  Height 5 feet 6 inches, temperature 98.3, pulse 112,  respirations 26, pain 10/10, improved on IV Dilaudid.  ABDOMEN:  Nontender.  Right flank tenderness is present.  EXTERNAL GENITALIA:  Normal.   ALLERGIES:  IVP DYE, MORPHINE AND TORADOL.   PLAN:  Renal obstetric ultrasound and chest x-ray.      Tilda Burrow, M.D.  Electronically Signed     JVF/MEDQ  D:  04/02/2006  T:  04/02/2006  Job:  [id number]   cc:   [facility]

## 2010-07-25 ENCOUNTER — Emergency Department (HOSPITAL_COMMUNITY)
Admission: EM | Admit: 2010-07-25 | Discharge: 2010-07-25 | Disposition: A | Discharge status: Home / Self Care | Payer: Medicaid Other | Attending: Emergency Medicine | Admitting: Emergency Medicine

## 2010-07-25 DIAGNOSIS — J45909 Unspecified asthma, uncomplicated: Secondary | ICD-10-CM | POA: Insufficient documentation

## 2010-07-25 DIAGNOSIS — R109 Unspecified abdominal pain: Secondary | ICD-10-CM | POA: Insufficient documentation

## 2010-07-25 DIAGNOSIS — G43909 Migraine, unspecified, not intractable, without status migrainosus: Secondary | ICD-10-CM | POA: Insufficient documentation

## 2010-07-25 DIAGNOSIS — F17200 Nicotine dependence, unspecified, uncomplicated: Secondary | ICD-10-CM | POA: Insufficient documentation

## 2010-07-25 LAB — URINALYSIS, ROUTINE W REFLEX MICROSCOPIC
Nitrite: NEGATIVE
Specific Gravity, Urine: 1.02 (ref 1.005–1.030)
Urobilinogen, UA: 0.2 mg/dL (ref 0.0–1.0)
pH: 6 (ref 5.0–8.0)

## 2010-07-25 LAB — URINE MICROSCOPIC-ADD ON

## 2010-09-02 ENCOUNTER — Ambulatory Visit (HOSPITAL_COMMUNITY): Payer: Medicaid Other

## 2010-09-02 ENCOUNTER — Other Ambulatory Visit (HOSPITAL_COMMUNITY): Payer: Self-pay | Admitting: Emergency Medicine

## 2010-09-02 ENCOUNTER — Emergency Department (HOSPITAL_COMMUNITY)
Admission: EM | Admit: 2010-09-02 | Discharge: 2010-09-02 | Disposition: A | Discharge status: Home / Self Care | Payer: Medicaid Other | Attending: Emergency Medicine | Admitting: Emergency Medicine

## 2010-09-02 ENCOUNTER — Inpatient Hospital Stay (HOSPITAL_COMMUNITY): Admission: RE | Admit: 2010-09-02 | Payer: Medicaid Other | Source: Ambulatory Visit

## 2010-09-02 DIAGNOSIS — R1031 Right lower quadrant pain: Secondary | ICD-10-CM | POA: Insufficient documentation

## 2010-09-02 DIAGNOSIS — Z87442 Personal history of urinary calculi: Secondary | ICD-10-CM | POA: Insufficient documentation

## 2010-09-02 DIAGNOSIS — F17200 Nicotine dependence, unspecified, uncomplicated: Secondary | ICD-10-CM | POA: Insufficient documentation

## 2010-09-02 DIAGNOSIS — R52 Pain, unspecified: Secondary | ICD-10-CM

## 2010-09-02 HISTORY — DX: Unspecified ovarian cyst, unspecified side: N83.209

## 2010-09-02 HISTORY — DX: Calculus of kidney: N20.0

## 2010-09-02 LAB — URINALYSIS, ROUTINE W REFLEX MICROSCOPIC
Bilirubin Urine: NEGATIVE
Ketones, ur: NEGATIVE mg/dL
Nitrite: NEGATIVE
Specific Gravity, Urine: 1.02 (ref 1.005–1.030)
Urobilinogen, UA: 0.2 mg/dL (ref 0.0–1.0)

## 2010-09-02 LAB — URINE MICROSCOPIC-ADD ON

## 2010-09-02 LAB — PREGNANCY, URINE: Preg Test, Ur: NEGATIVE

## 2010-09-02 MED ORDER — OXYCODONE-ACETAMINOPHEN 5-325 MG PO TABS
1.0000 | ORAL_TABLET | Freq: Once | ORAL | Status: AC
  Administered 2010-09-02: 1 via ORAL
  Filled 2010-09-02: qty 1

## 2010-09-02 MED ORDER — PROMETHAZINE HCL 25 MG/ML IJ SOLN
25.0000 mg | Freq: Once | INTRAMUSCULAR | Status: AC
  Administered 2010-09-02: 25 mg via INTRAMUSCULAR
  Filled 2010-09-02: qty 1

## 2010-09-02 MED ORDER — HYDROMORPHONE HCL 1 MG/ML IJ SOLN
1.0000 mg | Freq: Once | INTRAMUSCULAR | Status: AC
  Administered 2010-09-02: 1 mg via INTRAMUSCULAR
  Filled 2010-09-02: qty 1

## 2010-09-02 MED ORDER — HYDROCODONE-ACETAMINOPHEN 5-500 MG PO TABS: 1.0000 | ORAL_TABLET | Freq: Four times a day (QID) | ORAL | Status: DC | PRN

## 2010-09-02 NOTE — ED Notes (Signed)
Pt. Is to be back at 12:30 for registation. And the test will be done at 1pm with a full bladder.

## 2010-09-02 NOTE — ED Provider Notes (Signed)
History     Chief Complaint  Patient presents with  . Pelvic Pain   Patient is a 25 y.o. female presenting with pelvic pain. The history is provided by the patient.  Pelvic Pain This is a new problem. The current episode started 3 to 5 hours ago. The problem occurs constantly. The problem has not changed since onset.Associated symptoms include abdominal pain. Pertinent negatives include no chest pain, no headaches and no shortness of breath. The symptoms are aggravated by nothing. The symptoms are relieved by nothing. She has tried nothing for the symptoms.  has h/o appy/ chole and cyst resected from R ovary in the past and now R sided pelvic pain that feels like prior ov cyst. She also has h/o kidney stones but states this does not feel like that. Has nausea no vomiting and no diarrhea.   Past Medical History  Diagnosis Date  . Ovarian cyst   . Kidney stones     Past Surgical History  Procedure Date  . Ovarian cyst removal   . Lithotripsy   . Laparoscopic tubal ligation   . Cholecystectomy   . Appendectomy     History reviewed. No pertinent family history.  History  Substance Use Topics  . Smoking status: Current Everyday Smoker -- 0.5 packs/day  . Smokeless tobacco: Not on file  . Alcohol Use: No    OB History    Grav Para Term Preterm Abortions TAB SAB Ect Mult Living                  Review of Systems  Constitutional: Negative for fever and chills.  HENT: Negative for neck pain and neck stiffness.   Eyes: Negative for pain.  Respiratory: Negative for shortness of breath.   Cardiovascular: Negative for chest pain.  Gastrointestinal: Positive for abdominal pain.  Genitourinary: Positive for pelvic pain. Negative for dysuria, urgency, frequency, hematuria, flank pain, vaginal bleeding, vaginal discharge and vaginal pain.  Musculoskeletal: Negative for back pain.  Skin: Negative for rash.  Neurological: Negative for syncope and headaches.  All other systems  reviewed and are negative.    Physical Exam  BP 139/89  Pulse 83  Temp(Src) 98.3 F (36.8 C) (Oral)  Resp 20  Ht 5\' 4"  (1.626 m)  Wt 207 lb (93.895 kg)  BMI 35.53 kg/m2  SpO2 100%  LMP 08/18/2010  Physical Exam  Constitutional: She is oriented to person, place, and time. She appears well-developed and well-nourished.  HENT:  Head: Normocephalic and atraumatic.  Eyes: Conjunctivae and EOM are normal. Pupils are equal, round, and reactive to light.  Neck: Trachea normal. Neck supple. No thyromegaly present.  Cardiovascular: Normal rate, regular rhythm, S1 normal, S2 normal and normal pulses.     No systolic murmur is present   No diastolic murmur is present  Pulses:      Radial pulses are 2+ on the right side, and 2+ on the left side.  Pulmonary/Chest: Effort normal and breath sounds normal. She has no wheezes. She has no rhonchi. She has no rales. She exhibits no tenderness.  Abdominal: Soft. Normal appearance and bowel sounds are normal. There is tenderness in the right lower quadrant. There is no CVA tenderness and negative Murphy's sign.    Musculoskeletal:       BLE:s Calves nontender, no cords or erythema, negative Homans sign  Neurological: She is alert and oriented to person, place, and time. She has normal strength. No cranial nerve deficit or sensory deficit. GCS eye subscore  is 4. GCS verbal subscore is 5. GCS motor subscore is 6.  Skin: Skin is warm and dry. No rash noted. She is not diaphoretic.  Psychiatric: Her speech is normal.       Cooperative and appropriate    ED Course  Procedures  MDM Given h/o ovarian cyst and presentation c/w same, plan [place].  No [place] available at this time. PT has hematuria and offered a CT scan that she declines to look for kidney stone. LMP about 2 weeks ago.  Outpatient [place] ordered for this am. GYN referral provided.       Sunnie Nielsen, MD 09/02/10 [phone]

## 2010-09-02 NOTE — ED Notes (Signed)
Informed pt to return for ultrasound with full bladder at 1230.

## 2010-09-02 NOTE — ED Notes (Signed)
Onset lower right quad pain @12  mn. Worse now. Positive nausea/vomiting. Denies diarrhea/constipation. Has hx of ovarian cyst and this feels the same.

## 2010-09-02 NOTE — ED Notes (Signed)
Correction on time of onset: 12noon not 12mn

## 2010-09-13 ENCOUNTER — Encounter (HOSPITAL_COMMUNITY): Payer: Self-pay

## 2010-09-13 ENCOUNTER — Emergency Department (HOSPITAL_COMMUNITY): Payer: Medicaid Other

## 2010-09-13 ENCOUNTER — Emergency Department (HOSPITAL_COMMUNITY)
Admission: EM | Admit: 2010-09-13 | Discharge: 2010-09-13 | Disposition: A | Discharge status: Home / Self Care | Payer: Medicaid Other | Attending: Emergency Medicine | Admitting: Emergency Medicine

## 2010-09-13 DIAGNOSIS — F17200 Nicotine dependence, unspecified, uncomplicated: Secondary | ICD-10-CM | POA: Insufficient documentation

## 2010-09-13 DIAGNOSIS — N200 Calculus of kidney: Secondary | ICD-10-CM | POA: Insufficient documentation

## 2010-09-13 DIAGNOSIS — R109 Unspecified abdominal pain: Secondary | ICD-10-CM | POA: Insufficient documentation

## 2010-09-13 DIAGNOSIS — N390 Urinary tract infection, site not specified: Secondary | ICD-10-CM | POA: Insufficient documentation

## 2010-09-13 DIAGNOSIS — R1031 Right lower quadrant pain: Secondary | ICD-10-CM | POA: Insufficient documentation

## 2010-09-13 HISTORY — DX: Calculus of kidney: N20.0

## 2010-09-13 HISTORY — DX: Urinary tract infection, site not specified: N39.0

## 2010-09-13 LAB — URINALYSIS, ROUTINE W REFLEX MICROSCOPIC
Bilirubin Urine: NEGATIVE
Glucose, UA: NEGATIVE mg/dL
Ketones, ur: NEGATIVE mg/dL
Nitrite: NEGATIVE
Specific Gravity, Urine: 1.02 (ref 1.005–1.030)
pH: 6.5 (ref 5.0–8.0)

## 2010-09-13 LAB — URINE MICROSCOPIC-ADD ON

## 2010-09-13 MED ORDER — KETOROLAC TROMETHAMINE 60 MG/2ML IM SOLN
60.0000 mg | Freq: Once | INTRAMUSCULAR | Status: AC
Start: 2010-09-13 — End: 2010-09-13
  Administered 2010-09-13: 60 mg via INTRAMUSCULAR
  Filled 2010-09-13: qty 2

## 2010-09-13 MED ORDER — HYDROMORPHONE HCL 1 MG/ML IJ SOLN
1.0000 mg | Freq: Once | INTRAMUSCULAR | Status: AC
  Administered 2010-09-13: 1 mg via INTRAMUSCULAR
  Filled 2010-09-13: qty 1

## 2010-09-13 MED ORDER — HYDROCODONE-ACETAMINOPHEN 5-325 MG PO TABS: 1.0000 | ORAL_TABLET | ORAL | Status: DC | PRN

## 2010-09-13 MED ORDER — HYDROCODONE-ACETAMINOPHEN 5-500 MG PO TABS: 1.0000 | ORAL_TABLET | Freq: Four times a day (QID) | ORAL | Status: AC | PRN

## 2010-09-13 MED ORDER — HYDROCODONE-ACETAMINOPHEN 5-500 MG PO TABS: 1.0000 | ORAL_TABLET | Freq: Four times a day (QID) | ORAL | Status: DC | PRN

## 2010-09-13 MED ORDER — PROMETHAZINE HCL 25 MG/ML IJ SOLN
25.0000 mg | Freq: Once | INTRAMUSCULAR | Status: AC
  Administered 2010-09-13: 25 mg via INTRAMUSCULAR
  Filled 2010-09-13: qty 1

## 2010-09-13 MED ORDER — PROMETHAZINE HCL 25 MG PO TABS: 25.0000 mg | ORAL_TABLET | Freq: Four times a day (QID) | ORAL | Status: DC | PRN

## 2010-09-13 MED ORDER — CIPROFLOXACIN HCL 500 MG PO TABS: 500.0000 mg | ORAL_TABLET | Freq: Two times a day (BID) | ORAL | Status: AC

## 2010-09-13 MED ORDER — MORPHINE SULFATE 4 MG/ML IJ SOLN
8.0000 mg | Freq: Once | INTRAMUSCULAR | Status: AC
  Administered 2010-09-13: 8 mg via INTRAVENOUS
  Filled 2010-09-13: qty 2

## 2010-09-13 NOTE — ED Provider Notes (Signed)
History     CSN: [id number] Arrival date & time: 09/13/2010  5:44 AM  Chief Complaint  Patient presents with  . Pelvic Pain   HPI Comments: Right sided abd pain since Wednesday.  Had similar problems a couple of weeks back.  Was given keflex and improved.  Returned when antibx completed.  No fevers or chills.  No vaginal discharge.  History of lithotrypsy, kidney stones.  The history is provided by the patient.    Past Medical History  Diagnosis Date  . Ovarian cyst   . Kidney stones   . UTI (lower urinary tract infection)   . Calcium oxalate renal stones     Past Surgical History  Procedure Date  . Ovarian cyst removal   . Lithotripsy   . Laparoscopic tubal ligation   . Cholecystectomy   . Appendectomy     No family history on file.  History  Substance Use Topics  . Smoking status: Current Everyday Smoker -- 0.5 packs/day  . Smokeless tobacco: Not on file  . Alcohol Use: No    OB History    Grav Para Term Preterm Abortions TAB SAB Ect Mult Living                  Review of Systems  Constitutional: Negative for fever and chills.  Gastrointestinal: Positive for abdominal pain.  Genitourinary: Positive for hematuria.  All other systems reviewed and are negative.    Physical Exam  BP 152/91  Pulse 94  Resp 24  Ht 5\' 4"  (1.626 m)  Wt 203 lb (92.08 kg)  BMI 34.84 kg/m2  SpO2 100%  LMP 08/28/2010  Physical Exam  Constitutional: She appears well-developed and well-nourished.       Patient is tearful, sobbing.  HENT:  Head: Normocephalic and atraumatic.  Neck: Normal range of motion. Neck supple.  Cardiovascular: Normal rate and regular rhythm.  Exam reveals no gallop and no friction rub.   No murmur heard. Pulmonary/Chest: Effort normal. No respiratory distress. She has no wheezes.  Abdominal: Soft. She exhibits no distension. There is tenderness.       There is moderate ttp in the rlq and right flank.  No rebound or guarding.    ED Course    Procedures  MDM CT shows kidney stones but no obstruction.  UA shows mild uti.  I am unsure as to the source of the pain, will treat with antibiotics and pain meds.  To return if worsens.        Geoffery Lyons, MD 09/13/10 1003

## 2010-09-13 NOTE — ED Notes (Signed)
Seen here 2 weeks ago for UTI, finished antibiotics on 09/09/10. Recurrent pain right lower quad started following day. Now pain 10/10 with n/v for 2 days

## 2010-10-20 ENCOUNTER — Emergency Department (HOSPITAL_COMMUNITY)
Admission: EM | Admit: 2010-10-20 | Discharge: 2010-10-21 | Disposition: A | Discharge status: Home / Self Care | Payer: Medicaid Other | Attending: Emergency Medicine | Admitting: Emergency Medicine

## 2010-10-20 ENCOUNTER — Encounter (HOSPITAL_COMMUNITY): Payer: Self-pay | Admitting: *Deleted

## 2010-10-20 DIAGNOSIS — N949 Unspecified condition associated with female genital organs and menstrual cycle: Secondary | ICD-10-CM | POA: Insufficient documentation

## 2010-10-20 DIAGNOSIS — F17200 Nicotine dependence, unspecified, uncomplicated: Secondary | ICD-10-CM | POA: Insufficient documentation

## 2010-10-20 DIAGNOSIS — Z87442 Personal history of urinary calculi: Secondary | ICD-10-CM | POA: Insufficient documentation

## 2010-10-20 DIAGNOSIS — N946 Dysmenorrhea, unspecified: Secondary | ICD-10-CM | POA: Insufficient documentation

## 2010-10-20 MED ORDER — PROMETHAZINE HCL 25 MG/ML IJ SOLN
25.0000 mg | Freq: Once | INTRAMUSCULAR | Status: AC
  Administered 2010-10-21: 25 mg via INTRAMUSCULAR
  Filled 2010-10-20: qty 1

## 2010-10-20 MED ORDER — HYDROMORPHONE HCL 1 MG/ML IJ SOLN
1.0000 mg | Freq: Once | INTRAMUSCULAR | Status: AC
  Administered 2010-10-21: 1 mg via INTRAMUSCULAR
  Filled 2010-10-20: qty 1

## 2010-10-20 NOTE — ED Notes (Signed)
Chronic abdominal pain, states she has vaginal bleeding since August 18th

## 2010-10-21 LAB — URINE MICROSCOPIC-ADD ON

## 2010-10-21 LAB — URINALYSIS, ROUTINE W REFLEX MICROSCOPIC
Ketones, ur: NEGATIVE mg/dL
Nitrite: NEGATIVE
Protein, ur: NEGATIVE mg/dL
Urobilinogen, UA: 0.2 mg/dL (ref 0.0–1.0)

## 2010-10-21 LAB — DIFFERENTIAL
Basophils Absolute: 0 10*3/uL (ref 0.0–0.1)
Basophils Relative: 1 % (ref 0–1)
Eosinophils Absolute: 0 10*3/uL (ref 0.0–0.7)
Eosinophils Relative: 1 % (ref 0–5)
Monocytes Absolute: 0.3 10*3/uL (ref 0.1–1.0)
Monocytes Relative: 4 % (ref 3–12)

## 2010-10-21 LAB — CBC
HCT: 39 % (ref 36.0–46.0)
Hemoglobin: 13 g/dL (ref 12.0–15.0)
MCH: 29.7 pg (ref 26.0–34.0)
MCHC: 33.3 g/dL (ref 30.0–36.0)
RDW: 13.4 % (ref 11.5–15.5)

## 2010-10-21 LAB — WET PREP, GENITAL

## 2010-10-21 MED ORDER — PROMETHAZINE HCL 25 MG/ML IJ SOLN
12.5000 mg | Freq: Once | INTRAMUSCULAR | Status: AC
  Administered 2010-10-21: 12.5 mg via INTRAVENOUS
  Filled 2010-10-21: qty 1

## 2010-10-21 MED ORDER — HYDROMORPHONE HCL 1 MG/ML IJ SOLN
1.0000 mg | Freq: Once | INTRAMUSCULAR | Status: AC
  Administered 2010-10-21: 1 mg via INTRAVENOUS
  Filled 2010-10-21: qty 1

## 2010-10-21 MED ORDER — BUTORPHANOL TARTRATE 2 MG/ML IJ SOLN: 1.0000 mg | Freq: Once | INTRAMUSCULAR | Status: DC

## 2010-10-21 MED ORDER — OXYCODONE-ACETAMINOPHEN 5-325 MG PO TABS: 1.0000 | ORAL_TABLET | ORAL | Status: AC | PRN

## 2010-10-21 MED ORDER — SODIUM CHLORIDE 0.9 % IV BOLUS (SEPSIS)
1000.0000 mL | Freq: Once | INTRAVENOUS | Status: AC
  Administered 2010-10-21: 1000 mL via INTRAVENOUS

## 2010-10-21 NOTE — ED Notes (Signed)
Informed by nursing supervisor that Stadol is out of stock [facility].  Dr Colon Branch made aware.  No alternatives available.

## 2010-10-21 NOTE — ED Provider Notes (Signed)
History     CSN: [id number] Arrival date & time: 10/20/2010 10:59 PM  Chief Complaint  Patient presents with  . Abdominal Pain   Patient is a 25 y.o. female presenting with abdominal pain. The history is provided by the patient.  Abdominal Pain The primary symptoms of the illness include abdominal pain, vomiting and vaginal bleeding. The primary symptoms of the illness do not include fever, shortness of breath, nausea or vaginal discharge. Primary symptoms comment: She has had menstrual bleeding since 09/24/10,  with heavier bleeding and right pelvic cramping for 3 days.  Similar to previous episodes of ovarian cysts.   Onset quality: Woke her from sleep 3 nights ago. The problem has not changed since onset. The patient states that she believes she is currently not pregnant. Additional symptoms associated with the illness include anorexia. Symptoms associated with the illness do not include constipation, urgency, frequency or back pain.    Past Medical History  Diagnosis Date  . Ovarian cyst   . Kidney stones   . UTI (lower urinary tract infection)   . Calcium oxalate renal stones     Past Surgical History  Procedure Date  . Ovarian cyst removal   . Lithotripsy   . Laparoscopic tubal ligation   . Cholecystectomy   . Appendectomy     No family history on file.  History  Substance Use Topics  . Smoking status: Current Everyday Smoker -- 0.5 packs/day  . Smokeless tobacco: Not on file  . Alcohol Use: No    OB History    Grav Para Term Preterm Abortions TAB SAB Ect Mult Living                  Review of Systems  Constitutional: Negative for fever.  HENT: Negative for congestion, sore throat and neck pain.   Eyes: Negative.   Respiratory: Negative for chest tightness and shortness of breath.   Cardiovascular: Negative for chest pain.  Gastrointestinal: Positive for vomiting, abdominal pain and anorexia. Negative for nausea and constipation.  Genitourinary: Positive for  vaginal bleeding and pelvic pain. Negative for urgency, frequency, flank pain and vaginal discharge.  Musculoskeletal: Negative for back pain, joint swelling and arthralgias.  Skin: Negative.  Negative for rash and wound.  Neurological: Negative for dizziness, weakness, light-headedness, numbness and headaches.  Hematological: Negative.   Psychiatric/Behavioral: Negative.     Physical Exam  BP 136/92  Pulse 74  Temp(Src) 98.1 F (36.7 C) (Oral)  Resp 22  Ht 5\' 4"  (1.626 m)  Wt 200 lb 9 oz (90.975 kg)  BMI 34.43 kg/m2  SpO2 100%  LMP 09/26/2010  Physical Exam  Nursing note and vitals reviewed. Constitutional: She is oriented to person, place, and time. She appears well-developed and well-nourished.       Appears uncomfortable  HENT:  Head: Normocephalic and atraumatic.  Eyes: Conjunctivae are normal.  Neck: Normal range of motion.  Cardiovascular: Normal rate, regular rhythm, normal heart sounds and intact distal pulses.   Pulmonary/Chest: Effort normal and breath sounds normal. She has no wheezes.  Abdominal: Soft. Bowel sounds are normal. There is tenderness. There is no rebound and no guarding.    Genitourinary: Cervix exhibits no motion tenderness. Right adnexum displays tenderness. Right adnexum displays no mass. Left adnexum displays no tenderness.  Musculoskeletal: Normal range of motion.  Neurological: She is alert and oriented to person, place, and time.  Skin: Skin is warm and dry.  Psychiatric: She has a normal mood and affect.  ED Course  Procedures  MDM Patient with dysmenorrhea and right pelvic pain consistent with prior episodes of ovarian cysts.  Has been seen by Dr. Emelda Fear in the past - will refer back for management.  Patients labs and/or radiological studies were reviewed during the medical decision making and disposition process.  Results for orders placed during the hospital encounter of 10/20/10  CBC      Component Value Range   WBC 6.3  4.0 -  10.5 (K/uL)   RBC 4.38  3.87 - 5.11 (MIL/uL)   Hemoglobin 13.0  12.0 - 15.0 (g/dL)   HCT 16.1  09.6 - 04.5 (%)   MCV 89.0  78.0 - 100.0 (fL)   MCH 29.7  26.0 - 34.0 (pg)   MCHC 33.3  30.0 - 36.0 (g/dL)   RDW 40.9  81.1 - 91.4 (%)   Platelets 281  150 - 400 (K/uL)  DIFFERENTIAL      Component Value Range   Neutrophils Relative 59  43 - 77 (%)   Neutro Abs 3.7  1.7 - 7.7 (K/uL)   Lymphocytes Relative 35  12 - 46 (%)   Lymphs Abs 2.2  0.7 - 4.0 (K/uL)   Monocytes Relative 4  3 - 12 (%)   Monocytes Absolute 0.3  0.1 - 1.0 (K/uL)   Eosinophils Relative 1  0 - 5 (%)   Eosinophils Absolute 0.0  0.0 - 0.7 (K/uL)   Basophils Relative 1  0 - 1 (%)   Basophils Absolute 0.0  0.0 - 0.1 (K/uL)  URINALYSIS, ROUTINE W REFLEX MICROSCOPIC      Component Value Range   Color, Urine AMBER (*) YELLOW    Appearance CLOUDY (*) CLEAR    Specific Gravity, Urine 1.015  1.005 - 1.030    pH 7.0  5.0 - 8.0    Glucose, UA NEGATIVE  NEGATIVE (mg/dL)   Hgb urine dipstick LARGE (*) NEGATIVE    Bilirubin Urine LARGE (*) NEGATIVE    Ketones, ur NEGATIVE  NEGATIVE (mg/dL)   Protein, ur NEGATIVE  NEGATIVE (mg/dL)   Urobilinogen, UA 0.2  0.0 - 1.0 (mg/dL)   Nitrite NEGATIVE  NEGATIVE    Leukocytes, UA NEGATIVE  NEGATIVE   WET PREP, GENITAL      Component Value Range   Yeast, Wet Prep NONE SEEN  NONE SEEN    Trich, Wet Prep FEW (*) NONE SEEN    Clue Cells, Wet Prep NONE SEEN  NONE SEEN    WBC, Wet Prep HPF POC NONE SEEN  NONE SEEN   URINE MICROSCOPIC-ADD ON      Component Value Range   Squamous Epithelial / LPF RARE  RARE    RBC / HPF TOO NUMEROUS TO COUNT  <3 (RBC/hpf)   Bacteria, UA FEW (*) RARE    Urinalysis was a clean catch, suspect vaginal contamination in urine specimen. Does have history of kidney stones,  But pain most consistent with her previous ovarian cyst pain.  Urine pregnancy (POC) was negative - run by RN Tiana Loft  and confirmed negative test.         Candis Musa,  PA 10/21/10 0229  Candis Musa, PA 10/21/10 [phone]

## 2010-10-21 NOTE — ED Notes (Signed)
Patient wanted a warm blanket but does not need anything else at this time.

## 2010-10-21 NOTE — ED Notes (Signed)
Medication Stadol, out of stock in both [facility].  Nursing supervisor contacted to request medication be brought to pt, if available.

## 2010-10-22 LAB — GC/CHLAMYDIA PROBE AMP, GENITAL: Chlamydia, DNA Probe: NEGATIVE

## 2010-10-27 NOTE — ED Provider Notes (Signed)
Medical screening examination/treatment/procedure(s) were performed by non-physician practitioner and as supervising physician I was immediately available for consultation/collaboration.  Delecia Vastine S. Donyae Kohn, MD 10/27/10 0620 

## 2010-11-10 LAB — DIFFERENTIAL
Basophils Relative: 3 — ABNORMAL HIGH
Eosinophils Absolute: 0.1
Monocytes Relative: 4
Neutrophils Relative %: 59

## 2010-11-10 LAB — WET PREP, GENITAL: Yeast Wet Prep HPF POC: NONE SEEN

## 2010-11-10 LAB — URINE MICROSCOPIC-ADD ON

## 2010-11-10 LAB — CBC
Hemoglobin: 11.6 — ABNORMAL LOW
RBC: 4.29
WBC: 5.6

## 2010-11-10 LAB — URINALYSIS, ROUTINE W REFLEX MICROSCOPIC
Bilirubin Urine: NEGATIVE
Bilirubin Urine: NEGATIVE
Glucose, UA: NEGATIVE
Glucose, UA: NEGATIVE
Glucose, UA: NEGATIVE
Hgb urine dipstick: NEGATIVE
Ketones, ur: NEGATIVE
Ketones, ur: NEGATIVE
Ketones, ur: NEGATIVE
Nitrite: NEGATIVE
pH: 6.5
pH: 6
pH: 6.5

## 2010-11-10 LAB — COMPREHENSIVE METABOLIC PANEL
ALT: 28
Alkaline Phosphatase: 93
CO2: 25
Chloride: 104
GFR calc non Af Amer: >60
Glucose, Bld: 102 — ABNORMAL HIGH
Potassium: 3.5
Sodium: 137
Total Bilirubin: 0.7
Total Protein: 7.5

## 2010-11-10 LAB — PREGNANCY, URINE: Preg Test, Ur: NEGATIVE

## 2010-11-10 LAB — GC/CHLAMYDIA PROBE AMP, GENITAL: GC Probe Amp, Genital: NEGATIVE

## 2010-11-12 LAB — COMPREHENSIVE METABOLIC PANEL
ALT: 28
AST: 20
Albumin: 4.4
CO2: 26
Calcium: 9.8
Chloride: 106
GFR calc Af Amer: >60
GFR calc non Af Amer: >60
Sodium: 138
Total Bilirubin: 1.1

## 2010-11-12 LAB — URINALYSIS, ROUTINE W REFLEX MICROSCOPIC
Leukocytes, UA: NEGATIVE
Nitrite: POSITIVE — AB
Protein, ur: NEGATIVE
Urobilinogen, UA: 0.2

## 2010-11-12 LAB — URINE CULTURE

## 2010-11-12 LAB — DIFFERENTIAL
Eosinophils Absolute: 0
Eosinophils Relative: 0
Lymphs Abs: 1.4
Monocytes Absolute: 0.2

## 2010-11-12 LAB — CBC
RBC: 4.34
WBC: 4.5

## 2010-11-12 LAB — URINE MICROSCOPIC-ADD ON

## 2010-11-14 LAB — URINE MICROSCOPIC-ADD ON

## 2010-11-14 LAB — CBC
Hemoglobin: 11.3 — ABNORMAL LOW
MCHC: 32.2
RBC: 4.64
WBC: 5.6

## 2010-11-14 LAB — RAPID STREP SCREEN (MED CTR MEBANE ONLY): Streptococcus, Group A Screen (Direct): NEGATIVE

## 2010-11-14 LAB — STREP A DNA PROBE: Group A Strep Probe: NEGATIVE

## 2010-11-14 LAB — URINALYSIS, ROUTINE W REFLEX MICROSCOPIC
Bilirubin Urine: NEGATIVE
Glucose, UA: NEGATIVE
Ketones, ur: NEGATIVE
Nitrite: NEGATIVE
Protein, ur: NEGATIVE
Protein, ur: NEGATIVE
Specific Gravity, Urine: 1.02
Urobilinogen, UA: 0.2
Urobilinogen, UA: 0.2
pH: 5.5

## 2010-11-14 LAB — BASIC METABOLIC PANEL
CO2: 25
Calcium: 9.8
Chloride: 103
GFR calc Af Amer: >60
Sodium: 138

## 2010-11-14 LAB — DIFFERENTIAL
Basophils Relative: 1
Lymphs Abs: 1.9
Monocytes Absolute: 0.2
Monocytes Relative: 4
Neutro Abs: 3.3

## 2010-11-27 LAB — CBC
HCT: 26.1 — ABNORMAL LOW
Hemoglobin: 9.1 — ABNORMAL LOW
RDW: 14.9 — ABNORMAL HIGH

## 2010-11-27 LAB — URINALYSIS, ROUTINE W REFLEX MICROSCOPIC
Bilirubin Urine: NEGATIVE
Bilirubin Urine: NEGATIVE
Glucose, UA: NEGATIVE
Ketones, ur: NEGATIVE
Ketones, ur: NEGATIVE
Nitrite: NEGATIVE
Urobilinogen, UA: 0.2
pH: 7

## 2010-11-27 LAB — DIFFERENTIAL
Basophils Absolute: 0
Eosinophils Relative: 1
Lymphocytes Relative: 20
Lymphs Abs: 1.8
Monocytes Absolute: 0.2

## 2010-11-27 LAB — URINE MICROSCOPIC-ADD ON

## 2010-11-27 LAB — RAPID URINE DRUG SCREEN, HOSP PERFORMED
Amphetamines: NOT DETECTED
Cocaine: NOT DETECTED
Opiates: NOT DETECTED
Tetrahydrocannabinol: NOT DETECTED

## 2010-11-27 LAB — BASIC METABOLIC PANEL
GFR calc non Af Amer: >60
Glucose, Bld: 70
Potassium: 3.6
Sodium: 134 — ABNORMAL LOW

## 2010-11-27 LAB — URINE CULTURE

## 2010-12-16 ENCOUNTER — Ambulatory Visit: Payer: Medicaid Other | Admitting: Urology

## 2010-12-31 ENCOUNTER — Emergency Department (HOSPITAL_COMMUNITY)
Admission: EM | Admit: 2010-12-31 | Discharge: 2011-01-01 | Disposition: A | Discharge status: Home / Self Care | Payer: Medicaid Other | Attending: Emergency Medicine | Admitting: Emergency Medicine

## 2010-12-31 ENCOUNTER — Encounter (HOSPITAL_COMMUNITY): Payer: Self-pay | Admitting: *Deleted

## 2010-12-31 ENCOUNTER — Emergency Department (HOSPITAL_COMMUNITY): Payer: Medicaid Other

## 2010-12-31 DIAGNOSIS — N23 Unspecified renal colic: Secondary | ICD-10-CM

## 2010-12-31 DIAGNOSIS — N201 Calculus of ureter: Secondary | ICD-10-CM | POA: Insufficient documentation

## 2010-12-31 DIAGNOSIS — F17200 Nicotine dependence, unspecified, uncomplicated: Secondary | ICD-10-CM | POA: Insufficient documentation

## 2010-12-31 DIAGNOSIS — R109 Unspecified abdominal pain: Secondary | ICD-10-CM | POA: Insufficient documentation

## 2010-12-31 LAB — BASIC METABOLIC PANEL
BUN: 9 mg/dL (ref 6–23)
CO2: 26 mEq/L (ref 19–32)
Calcium: 9.8 mg/dL (ref 8.4–10.5)
Chloride: 104 mEq/L (ref 96–112)
Creatinine, Ser: 0.75 mg/dL (ref 0.50–1.10)
GFR calc Af Amer: >90 mL/min (ref >90)
GFR calc non Af Amer: >90 mL/min (ref >90)
Glucose, Bld: 87 mg/dL (ref 70–99)
Potassium: 3.6 mEq/L (ref 3.5–5.1)
Sodium: 139 mEq/L (ref 135–145)

## 2010-12-31 LAB — CBC
HCT: 41.6 % (ref 36.0–46.0)
Hemoglobin: 13.5 g/dL (ref 12.0–15.0)
MCH: 29.7 pg (ref 26.0–34.0)
MCHC: 32.5 g/dL (ref 30.0–36.0)
MCV: 91.6 fL (ref 78.0–100.0)
Platelets: 296 10*3/uL (ref 150–400)
RBC: 4.54 MIL/uL (ref 3.87–5.11)
RDW: 13.1 % (ref 11.5–15.5)
WBC: 6.4 10*3/uL (ref 4.0–10.5)

## 2010-12-31 LAB — URINALYSIS, ROUTINE W REFLEX MICROSCOPIC
Bilirubin Urine: NEGATIVE
Glucose, UA: NEGATIVE mg/dL
Ketones, ur: NEGATIVE mg/dL
Leukocytes, UA: NEGATIVE
Nitrite: NEGATIVE
Specific Gravity, Urine: 1.02 (ref 1.005–1.030)
Urobilinogen, UA: 0.2 mg/dL (ref 0.0–1.0)
pH: 6 (ref 5.0–8.0)

## 2010-12-31 LAB — URINE MICROSCOPIC-ADD ON

## 2010-12-31 LAB — POCT PREGNANCY, URINE: Preg Test, Ur: NEGATIVE

## 2010-12-31 MED ORDER — ACETAMINOPHEN 325 MG PO TABS
650.0000 mg | ORAL_TABLET | Freq: Once | ORAL | Status: AC
  Administered 2010-12-31: 650 mg via ORAL
  Filled 2010-12-31: qty 2

## 2010-12-31 MED ORDER — PROMETHAZINE HCL 25 MG/ML IJ SOLN
12.5000 mg | Freq: Once | INTRAMUSCULAR | Status: AC
  Administered 2010-12-31: 12.5 mg via INTRAVENOUS
  Filled 2010-12-31: qty 1

## 2010-12-31 MED ORDER — SODIUM CHLORIDE 0.9 % IV BOLUS (SEPSIS)
1000.0000 mL | Freq: Once | INTRAVENOUS | Status: AC
  Administered 2010-12-31: 1000 mL via INTRAVENOUS

## 2010-12-31 MED ORDER — HYDROMORPHONE HCL PF 1 MG/ML IJ SOLN
1.0000 mg | Freq: Once | INTRAMUSCULAR | Status: AC
  Administered 2010-12-31: 1 mg via INTRAVENOUS
  Filled 2010-12-31: qty 1

## 2010-12-31 MED ORDER — HYDROMORPHONE HCL PF 1 MG/ML IJ SOLN
0.5000 mg | Freq: Once | INTRAMUSCULAR | Status: AC
  Administered 2010-12-31: 0.5 mg via INTRAVENOUS
  Filled 2010-12-31: qty 1

## 2010-12-31 NOTE — ED Notes (Signed)
Pt states that yesterday she felt like she had to urinate all day, today pain has increased, pain is right flank area and radiates to right lower abd area, admits to nausea, was recently tx for kidney infection by pcp

## 2010-12-31 NOTE — ED Provider Notes (Signed)
History  Scribed for Raeford Razor, MD, the patient was seen in room APA18. This chart was scribed by Hillery Hunter.   CSN: [id number] Arrival date & time: 12/31/2010  9:08 PM   First MD Initiated Contact with Patient 12/31/10 2141      Chief Complaint  Patient presents with  . Flank Pain     The history is provided by the patient.    [name] is a 25 y.o. female who presents to the Emergency Department complaining of sharp lower abdominal pain. She states she recently had similar pain three weeks ago when she was diagnosed with UTI and given Cipro. She completed the course of antibiotics with resolution of pain and urinary symptoms.  She complains currently of recurrence of similar abdominal pain and associated urinary urgency and hesitancy. She called her PCP and was directed to make an appointment. After pain worsened she called her doctor and was told to come to the ER for evaluation. She complains of associated vomiting and denies fever and chills. She has a history of asthma and lithotripsy for kidney stones, ovarian cyst removal, appendectomy and cholecystectomy. She denies vaginal bleeding and discharge.   Past Medical History  Diagnosis Date  . Ovarian cyst   . Kidney stones   . UTI (lower urinary tract infection)   . Calcium oxalate renal stones     Past Surgical History  Procedure Date  . Ovarian cyst removal   . Lithotripsy   . Laparoscopic tubal ligation   . Cholecystectomy   . Appendectomy     No family history on file.  History  Substance Use Topics  . Smoking status: Current Everyday Smoker -- 0.5 packs/day  . Smokeless tobacco: Not on file  . Alcohol Use: No     Review of Systems   Review of symptoms negative unless otherwise noted in HPI.  Allergies  Iohexol; Zofran; Morphine and related; and Toradol  Home Medications   Current Outpatient Rx  Name Route Sig Dispense Refill  . DIMENHYDRINATE 50 MG PO TABS Oral Take 50  mg by mouth every 8 (eight) hours as needed. For nausea    . IBUPROFEN 800 MG PO TABS Oral Take 800 mg by mouth every 8 (eight) hours as needed. For pain    . TOPIRAMATE 50 MG PO TABS Oral Take 50 mg by mouth 2 (two) times daily.        Triage vitals: BP 146/100  Pulse 87  Temp(Src) 97.7 F (36.5 C) (Oral)  Resp 16  Ht 5\' 4"  (1.626 m)  Wt 210 lb (95.255 kg)  BMI 36.05 kg/m2  SpO2 100%  LMP 12/28/2010  Physical Exam  Nursing note and vitals reviewed. Constitutional: She is oriented to person, place, and time. She appears well-developed and well-nourished.       tearful  Neck: Neck supple.  Cardiovascular: Normal rate, regular rhythm and normal heart sounds.   Pulmonary/Chest: Effort normal. No respiratory distress. She has no wheezes. She has no rales.  Abdominal: She exhibits no distension. There is tenderness (CVA, RLQ tenderness with guarding). There is guarding (voluntary). There is no rebound.       obese  Neurological: She is alert and oriented to person, place, and time.  Skin: Skin is warm and dry.  Psychiatric: Her behavior is normal.       Anxious but non-toxic    ED Course  Procedures   Labs Reviewed  URINALYSIS, ROUTINE W REFLEX MICROSCOPIC - Abnormal; Notable for the  following:    Hgb urine dipstick LARGE (*)    Protein, ur TRACE (*)    All other components within normal limits  URINE MICROSCOPIC-ADD ON - Abnormal; Notable for the following:    Squamous Epithelial / LPF FEW (*)    All other components within normal limits  POCT PREGNANCY, URINE  CBC  BASIC METABOLIC PANEL  LAB REPORT - SCANNED   No results found.  Ct Abdomen Wo Contrast  12/31/2010  *RADIOLOGY REPORT*  Clinical Data:  Right flank pain.  Nausea.  History of urinary tract calculi.  Surgical history includes cholecystectomy, appendectomy, tubal ligation, ovarian cyst removal, and lithotripsy.  CT ABDOMEN AND PELVIS WITHOUT CONTRAST 12/31/2010:  Technique:  Multidetector CT imaging of the  abdomen and pelvis was performed following the standard protocol without intravenous contrast.  Comparison:  CT abdomen and pelvis 09/13/2010, 03/06/2010, 08/14/2009, 01/22/2009, 06/12/2008, 05/27/2008, 03/25/2008, 12/03/2007, 10/12/2007, 01/10/2007, 12/06/2005, 09/20/2005, 01/07/2005, 12/29/2004, 10/21/2004, 10/12/2004, and 09/23/2004. Prior studies performed at both [facility] and [facility].  Findings:  Approximate 3 mm calculus in the proximal right ureter causing very mild hydronephrosis.  Numerous bilateral renal calculi, right greater than left.  No left ureteral calculi.  No left hydronephrosis.  Within the limits of the low dose unenhanced technique, no focal parenchymal abnormality involving either kidney.  Normal low dose unenhanced appearance of the liver, spleen, pancreas, and adrenal glands.  Gallbladder surgically absent.  No unexpected biliary ductal dilation.  No visible aorto-iliofemoral atherosclerosis.  No significant lymphadenopathy.  Stomach normal in appearance, filled with food.  Normal-appearing small bowel and colon.  No ascites.  Urinary bladder decompressed and unremarkable.  Numerous pelvic phleboliths.  Uterus and adnexa unremarkable for the unenhanced technique.  Bone window images unremarkable.  Visualized lung bases clear. Heart size normal.  IMPRESSION:  1.  Mildly obstructing approximate 3 mm calculus in the proximal right ureter. 2.  Numerous bilateral renal calculi, right greater than left. 3.  For purposes of keeping up with lifetime radiation exposure, please note that this is the 18th CT of the abdomen and pelvis in the [facility] since August, 2006.  Original Report Authenticated By: Arnell Sieving, M.D.     OTHER DATA REVIEWED: Nursing notes, vital signs reviewed.   DIAGNOSTIC STUDIES: Oxygen Saturation is 100% on room air, normal by my interpretation.     ED COURSE / COORDINATION OF CARE: 22:09. Ordered: CT Abdomen Wo Contrast ;  CBC ; Basic metabolic panel ; sodium chloride 0.9 % bolus 1,000 mL, HYDROmorphone (DILAUDID) injection 0.5 mg ; promethazine (PHENERGAN) injection 12.5 mg  23:40. Discussed test findings and treatment plan with patient at bedside. Informed patient of CT scan results. Also explained to pt that she has had almost 20 CT since 2006, explained risk of radiation, that these effects are cumulative over time. If she has similar complaints in the future, she needs to let her healthcare provider how many scans she has had int he past so they are aware.   MDM  25yf with r flank pain. Hx, urine and CT consistent with ureterolithiasis. Pain and nausea controlled prior to DC. No symptoms or UA findings to suggest infection. Renal function normal. Stone 3mm and plan expectannt management. Pain control and urology fu as needed.    I personally preformed the services scribed in my presence. The recorded information has been reviewed and considered. Raeford Razor, MD.     Raeford Razor, MD 01/05/11 [facility] Manners

## 2011-01-01 MED ORDER — OXYCODONE-ACETAMINOPHEN 5-325 MG PO TABS
2.0000 | ORAL_TABLET | Freq: Once | ORAL | Status: AC
  Administered 2011-01-01: 2 via ORAL
  Filled 2011-01-01: qty 2

## 2011-01-01 MED ORDER — OXYCODONE-ACETAMINOPHEN 5-325 MG PO TABS: 1.0000 | ORAL_TABLET | ORAL | Status: AC | PRN

## 2011-01-01 MED ORDER — PROMETHAZINE HCL 25 MG PO TABS: 25.0000 mg | ORAL_TABLET | Freq: Three times a day (TID) | ORAL | Status: DC | PRN

## 2011-01-09 ENCOUNTER — Emergency Department (HOSPITAL_COMMUNITY)
Admission: EM | Admit: 2011-01-09 | Discharge: 2011-01-09 | Disposition: A | Discharge status: Home / Self Care | Payer: Medicaid Other | Attending: Emergency Medicine | Admitting: Emergency Medicine

## 2011-01-09 ENCOUNTER — Encounter (HOSPITAL_COMMUNITY): Payer: Self-pay | Admitting: *Deleted

## 2011-01-09 DIAGNOSIS — N200 Calculus of kidney: Secondary | ICD-10-CM | POA: Insufficient documentation

## 2011-01-09 LAB — URINALYSIS, ROUTINE W REFLEX MICROSCOPIC
Ketones, ur: NEGATIVE mg/dL
Urobilinogen, UA: 0.2 mg/dL (ref 0.0–1.0)

## 2011-01-09 LAB — URINE MICROSCOPIC-ADD ON

## 2011-01-09 MED ORDER — METRONIDAZOLE 500 MG PO TABS
2000.0000 mg | ORAL_TABLET | Freq: Once | ORAL | Status: AC
  Administered 2011-01-09: 2000 mg via ORAL

## 2011-01-09 MED ORDER — METRONIDAZOLE 500 MG PO TABS
2000.0000 mg | ORAL_TABLET | Freq: Once | ORAL | Status: DC
  Filled 2011-01-09: qty 4

## 2011-01-09 MED ORDER — HYDROMORPHONE HCL PF 1 MG/ML IJ SOLN: 1.0000 mg | Freq: Once | INTRAMUSCULAR | Status: DC

## 2011-01-09 MED ORDER — BUTORPHANOL TARTRATE 10 MG/ML NA SOLN: 1.0000 | NASAL | Status: AC | PRN

## 2011-01-09 MED ORDER — PROMETHAZINE HCL 25 MG/ML IJ SOLN
25.0000 mg | Freq: Once | INTRAMUSCULAR | Status: AC
  Administered 2011-01-09: 25 mg via INTRAVENOUS
  Filled 2011-01-09: qty 1

## 2011-01-09 MED ORDER — PROMETHAZINE HCL 25 MG PO TABS: 25.0000 mg | ORAL_TABLET | Freq: Four times a day (QID) | ORAL | Status: DC | PRN

## 2011-01-09 MED ORDER — HYDROMORPHONE HCL PF 1 MG/ML IJ SOLN
1.0000 mg | Freq: Once | INTRAMUSCULAR | Status: AC
  Administered 2011-01-09: 1 mg via INTRAVENOUS
  Filled 2011-01-09: qty 1

## 2011-01-09 MED ORDER — OXYCODONE-ACETAMINOPHEN 5-325 MG PO TABS
2.0000 | ORAL_TABLET | Freq: Once | ORAL | Status: AC
  Administered 2011-01-09: 2 via ORAL
  Filled 2011-01-09: qty 2

## 2011-01-09 MED ORDER — TAMSULOSIN HCL 0.4 MG PO CAPS: 0.4000 mg | ORAL_CAPSULE | Freq: Two times a day (BID) | ORAL | Status: DC

## 2011-01-09 MED ORDER — SODIUM CHLORIDE 0.9 % IV BOLUS (SEPSIS)
1000.0000 mL | INTRAVENOUS | Status: AC
  Administered 2011-01-09: 1000 mL via INTRAVENOUS

## 2011-01-09 NOTE — ED Notes (Signed)
Patient in lots of pain even after meds given

## 2011-01-09 NOTE — ED Notes (Signed)
Pt reports she was seen by Dr. Felecia Shelling earlier today and dx with UTI, pt prescribed cipro, while at work tonight and began to have pain in rt flank and hematuria, pt also reports she has had kidney stones and tonight strained her urine and notived "sand" like particles

## 2011-01-09 NOTE — ED Provider Notes (Signed)
History     CSN: [id number] Arrival date & time: 01/09/2011  1:27 AM   First MD Initiated Contact with Patient 01/09/11 0130      Chief Complaint  Patient presents with  . Flank Pain    (Consider location/radiation/quality/duration/timing/severity/associated sxs/prior treatment) HPI Comments: Associated nausea and vomiting, no fevers, sharp pain, radiation to right flank. States is similar to prior kidney stone pain. She was seen by her primary doctor earlier today as a followup from a prior ER visit and found to have a UTI, placed on ciprofloxacin. Tonight developed hematuria with the acute onset of sharp right-sided pain  Patient is a 25 y.o. female presenting with flank pain. The history is provided by the patient.  Flank Pain This is a recurrent (History of multiple kidney stones in the past, has had 18 CT scans over the last 6 years to evaluate for same. Most recently done this month, most recent lithotripsy 2008, has been straining urine with resultant gravel or sand-type sediment) problem. Episode onset: 6 hours ago. Episode frequency: Intermittently, waxing and waning. The problem has not changed since onset.Associated symptoms include abdominal pain ( Right-sided pain with radiation to the right flank). Pertinent negatives include no chest pain, no headaches and no shortness of breath. The symptoms are aggravated by nothing (History of renal colic and multiple kidney stones, last imaged in the last month with bilateral renal stones visualized, 3 mm proximal right ureteral stone at that time.). The symptoms are relieved by nothing. Treatments tried: Ibuprofen, Dramamine. The treatment provided no relief.    Past Medical History  Diagnosis Date  . Ovarian cyst   . Kidney stones   . UTI (lower urinary tract infection)   . Calcium oxalate renal stones     Past Surgical History  Procedure Date  . Ovarian cyst removal   . Lithotripsy   . Laparoscopic tubal ligation   .  Cholecystectomy   . Appendectomy     No family history on file.  History  Substance Use Topics  . Smoking status: Current Everyday Smoker -- 0.5 packs/day  . Smokeless tobacco: Not on file  . Alcohol Use: No    OB History    Grav Para Term Preterm Abortions TAB SAB Ect Mult Living                  Review of Systems  Respiratory: Negative for shortness of breath.   Cardiovascular: Negative for chest pain.  Gastrointestinal: Positive for abdominal pain ( Right-sided pain with radiation to the right flank).  Genitourinary: Positive for flank pain.  Neurological: Negative for headaches.  All other systems reviewed and are negative.    Allergies  Iohexol; Zofran; Morphine and related; and Toradol  Home Medications   Current Outpatient Rx  Name Route Sig Dispense Refill  . CIPRO PO Oral Take by mouth.      . BUTORPHANOL TARTRATE 10 MG/ML NA SOLN Nasal Place 1 spray into the nose every 4 (four) hours as needed for pain. 2.5 mL 0  . DIMENHYDRINATE 50 MG PO TABS Oral Take 50 mg by mouth every 8 (eight) hours as needed. For nausea    . IBUPROFEN 800 MG PO TABS Oral Take 800 mg by mouth every 8 (eight) hours as needed. For pain    . OXYCODONE-ACETAMINOPHEN 5-325 MG PO TABS Oral Take 1 tablet by mouth every 4 (four) hours as needed for pain. 15 tablet 0  . PROMETHAZINE HCL 25 MG PO TABS Oral Take  1 tablet (25 mg total) by mouth every 6 (six) hours as needed for nausea. 12 tablet 0  . TAMSULOSIN HCL 0.4 MG PO CAPS Oral Take 1 capsule (0.4 mg total) by mouth 2 (two) times daily. 10 capsule 0  . TOPIRAMATE 50 MG PO TABS Oral Take 50 mg by mouth 2 (two) times daily.        BP 139/107  Pulse 95  Temp(Src) 98.2 F (36.8 C) (Oral)  Resp 20  Ht 5\' 4"  (1.626 m)  Wt 210 lb (95.255 kg)  BMI 36.05 kg/m2  SpO2 100%  LMP 12/28/2010  Physical Exam  Nursing note and vitals reviewed. Constitutional: She appears well-developed and well-nourished.       Uncomfortable appearing  HENT:    Head: Normocephalic and atraumatic.  Mouth/Throat: Oropharynx is clear and moist. No oropharyngeal exudate.  Eyes: Conjunctivae and EOM are normal. Pupils are equal, round, and reactive to light. Right eye exhibits no discharge. Left eye exhibits no discharge. No scleral icterus.  Neck: Normal range of motion. Neck supple. No JVD present. No thyromegaly present.  Cardiovascular: Normal rate, regular rhythm, normal heart sounds and intact distal pulses.  Exam reveals no gallop and no friction rub.   No murmur heard. Pulmonary/Chest: Effort normal and breath sounds normal. No respiratory distress. She has no wheezes. She has no rales.  Abdominal: Soft. Bowel sounds are normal. She exhibits no distension and no mass. There is no tenderness.       CVA tenderness on the right  Musculoskeletal: Normal range of motion. She exhibits no edema and no tenderness.  Lymphadenopathy:    She has no cervical adenopathy.  Neurological: She is alert. Coordination normal.  Skin: Skin is warm and dry. No rash noted. No erythema.  Psychiatric: She has a normal mood and affect. Her behavior is normal.    ED Course  Procedures (including critical care time)  Labs Reviewed  URINALYSIS, ROUTINE W REFLEX MICROSCOPIC - Abnormal; Notable for the following:    Color, Urine RED (*) BIOCHEMICALS MAY BE AFFECTED BY COLOR   APPearance CLOUDY (*)    Hgb urine dipstick LARGE (*)    Protein, ur TRACE (*)    Leukocytes, UA TRACE (*)    All other components within normal limits  URINE MICROSCOPIC-ADD ON - Abnormal; Notable for the following:    Squamous Epithelial / LPF MANY (*)    Bacteria, UA FEW (*)    All other components within normal limits  URINE CULTURE   No results found.   1. Kidney stone on right side       MDM  Gait normal, abdominal exam benign, CVA tenderness found on the right. Vital signs without fever or tachycardia. Check urinalysis, treat presumptively for renal colic given history of same  and recent CT scan showing multiple tiny bilateral stones. Recheck urinalysis and culture. Currently on ciprofloxacin given today. IV fluids and IV hydromorphone   Urinalysis reviewed, consistent with kidney stone, improved with hydromorphone and Phenergan. Patient states that Stadol makes her feel better, no intravascular supplies available at this time. Home with intranasal Stadol prescription and Phenergan. urology followup recommended     Vida Roller, MD 01/09/11 [phone]

## 2011-01-09 NOTE — ED Notes (Signed)
Pt moaning and crying in the bed; guarding abd; distress noted; MD in to see pt

## 2011-01-10 LAB — URINE CULTURE: Colony Count: 75000

## 2011-02-10 ENCOUNTER — Emergency Department (HOSPITAL_COMMUNITY): Payer: Medicaid Other

## 2011-02-10 ENCOUNTER — Emergency Department (HOSPITAL_COMMUNITY)
Admission: EM | Admit: 2011-02-10 | Discharge: 2011-02-11 | Disposition: A | Discharge status: Home / Self Care | Payer: Medicaid Other | Attending: Emergency Medicine | Admitting: Emergency Medicine

## 2011-02-10 ENCOUNTER — Encounter (HOSPITAL_COMMUNITY): Payer: Self-pay

## 2011-02-10 DIAGNOSIS — R350 Frequency of micturition: Secondary | ICD-10-CM | POA: Insufficient documentation

## 2011-02-10 DIAGNOSIS — R300 Dysuria: Secondary | ICD-10-CM

## 2011-02-10 DIAGNOSIS — R10813 Right lower quadrant abdominal tenderness: Secondary | ICD-10-CM | POA: Insufficient documentation

## 2011-02-10 DIAGNOSIS — R319 Hematuria, unspecified: Secondary | ICD-10-CM | POA: Insufficient documentation

## 2011-02-10 DIAGNOSIS — F17200 Nicotine dependence, unspecified, uncomplicated: Secondary | ICD-10-CM | POA: Insufficient documentation

## 2011-02-10 DIAGNOSIS — R112 Nausea with vomiting, unspecified: Secondary | ICD-10-CM | POA: Insufficient documentation

## 2011-02-10 DIAGNOSIS — R109 Unspecified abdominal pain: Secondary | ICD-10-CM

## 2011-02-10 DIAGNOSIS — R3915 Urgency of urination: Secondary | ICD-10-CM | POA: Insufficient documentation

## 2011-02-10 DIAGNOSIS — N200 Calculus of kidney: Secondary | ICD-10-CM | POA: Insufficient documentation

## 2011-02-10 LAB — URINE MICROSCOPIC-ADD ON

## 2011-02-10 LAB — URINALYSIS, ROUTINE W REFLEX MICROSCOPIC
Bilirubin Urine: NEGATIVE
Glucose, UA: NEGATIVE mg/dL
Ketones, ur: NEGATIVE mg/dL
Protein, ur: NEGATIVE mg/dL
pH: 7 (ref 5.0–8.0)

## 2011-02-10 MED ORDER — PROMETHAZINE HCL 25 MG/ML IJ SOLN
INTRAMUSCULAR | Status: AC
  Filled 2011-02-10: qty 1

## 2011-02-10 MED ORDER — HYDROMORPHONE HCL PF 1 MG/ML IJ SOLN
1.0000 mg | Freq: Once | INTRAMUSCULAR | Status: AC
  Administered 2011-02-10: 1 mg via INTRAVENOUS

## 2011-02-10 MED ORDER — HYDROMORPHONE HCL PF 1 MG/ML IJ SOLN
1.0000 mg | Freq: Once | INTRAMUSCULAR | Status: AC
  Administered 2011-02-10: 1 mg via INTRAVENOUS
  Filled 2011-02-10: qty 1

## 2011-02-10 MED ORDER — HYDROMORPHONE HCL PF 1 MG/ML IJ SOLN
INTRAMUSCULAR | Status: AC
  Administered 2011-02-10: 1 mg via INTRAVENOUS
  Filled 2011-02-10: qty 1

## 2011-02-10 MED ORDER — PROMETHAZINE HCL 25 MG/ML IJ SOLN
12.5000 mg | Freq: Once | INTRAMUSCULAR | Status: AC
  Administered 2011-02-10: 12.5 mg via INTRAVENOUS

## 2011-02-10 MED ORDER — PROMETHAZINE HCL 25 MG/ML IJ SOLN
12.5000 mg | Freq: Once | INTRAMUSCULAR | Status: AC
  Administered 2011-02-10: 22:00:00 via INTRAVENOUS

## 2011-02-10 NOTE — ED Notes (Signed)
Pt presents with RLQ pain that started Saturday and vomiting that started this AM.

## 2011-02-10 NOTE — ED Notes (Signed)
Pt states that the pain medication only helped "a  Little"

## 2011-02-10 NOTE — ED Provider Notes (Signed)
History     CSN: [id number]  Arrival date & time 02/10/11  2101   First MD Initiated Contact with Patient 02/10/11 2116      Chief Complaint  Patient presents with  . Abdominal Pain  . Emesis    (Consider location/radiation/quality/duration/timing/severity/associated sxs/prior treatment) Patient is a 26 y.o. female presenting with abdominal pain and vomiting. The history is provided by the patient.  Abdominal Pain The primary symptoms of the illness include abdominal pain, nausea, vomiting and dysuria. The primary symptoms of the illness do not include fever or shortness of breath. Primary symptoms comment: She has a right 3 mm ureteral stone that was diagnosed 01/09/11 here by CT scan.  She states she has strained her urine untl 2 weeks ago when the pain stopped,  but had never passed a stone.  Had sudden onset of right flank and rlq pain again 2 days ago. The current episode started 2 days ago (Her pain is pinching and aching, rather than sharp and stabbing like her typical kidney stone pain.). The onset of the illness was sudden. Progression since onset: Symptoms have progressed to now including nausea with emesis today.  The dysuria is associated with hematuria, frequency and urgency.  The patient states that she believes she is currently not pregnant. Additional symptoms associated with the illness include urgency, hematuria and frequency. Associated medical issues comments: kidney stones..  Emesis  Associated symptoms include abdominal pain. Pertinent negatives include no arthralgias, no fever and no headaches.    Past Medical History  Diagnosis Date  . Ovarian cyst   . Kidney stones   . UTI (lower urinary tract infection)   . Calcium oxalate renal stones     Past Surgical History  Procedure Date  . Ovarian cyst removal   . Lithotripsy   . Laparoscopic tubal ligation   . Cholecystectomy   . Appendectomy     No family history on file.  History  Substance Use Topics    . Smoking status: Current Everyday Smoker -- 0.5 packs/day  . Smokeless tobacco: Not on file  . Alcohol Use: No    OB History    Grav Para Term Preterm Abortions TAB SAB Ect Mult Living                  Review of Systems  Constitutional: Negative for fever.  HENT: Negative for congestion, sore throat and neck pain.   Eyes: Negative.   Respiratory: Negative for chest tightness and shortness of breath.   Cardiovascular: Negative for chest pain.  Gastrointestinal: Positive for nausea, vomiting and abdominal pain.  Genitourinary: Positive for dysuria, urgency, frequency and hematuria.  Musculoskeletal: Negative for joint swelling and arthralgias.  Skin: Negative.  Negative for rash and wound.  Neurological: Negative for dizziness, weakness, light-headedness, numbness and headaches.  Hematological: Negative.   Psychiatric/Behavioral: Negative.     Allergies  Iohexol; Zofran; Morphine and related; and Toradol  Home Medications   Current Outpatient Rx  Name Route Sig Dispense Refill  . DIMENHYDRINATE 50 MG PO TABS Oral Take 50 mg by mouth every 8 (eight) hours as needed. For nausea    . IBUPROFEN 800 MG PO TABS Oral Take 800 mg by mouth every 8 (eight) hours as needed. For pain    . TOPIRAMATE 50 MG PO TABS Oral Take 50 mg by mouth 2 (two) times daily.      Marland Kitchen ONDANSETRON HCL 4 MG PO TABS Oral Take 1 tablet (4 mg total) by mouth every  8 (eight) hours as needed for nausea. 12 tablet 0  . OXYCODONE-ACETAMINOPHEN 5-325 MG PO TABS Oral Take 1 tablet by mouth every 4 (four) hours as needed for pain. 20 tablet 0    BP 152/106  Pulse 77  Temp(Src) 98.4 F (36.9 C) (Oral)  Resp 20  Ht 5\' 4"  (1.626 m)  Wt 205 lb (92.987 kg)  BMI 35.19 kg/m2  SpO2 95%  LMP 01/28/2011  Physical Exam  Nursing note and vitals reviewed. Constitutional: She is oriented to person, place, and time. She appears well-developed and well-nourished.  HENT:  Head: Normocephalic and atraumatic.  Eyes:  Conjunctivae are normal.  Neck: Normal range of motion.  Cardiovascular: Normal rate, regular rhythm, normal heart sounds and intact distal pulses.   Pulmonary/Chest: Effort normal and breath sounds normal. She has no wheezes.  Abdominal: Soft. Bowel sounds are normal. There is tenderness in the right lower quadrant. There is CVA tenderness. There is no rigidity, no rebound and no guarding.       Right cva tenderness.  Musculoskeletal: Normal range of motion.  Neurological: She is alert and oriented to person, place, and time.  Skin: Skin is warm and dry.  Psychiatric: She has a normal mood and affect.    ED Course  Procedures (including critical care time)  Labs Reviewed  URINALYSIS, ROUTINE W REFLEX MICROSCOPIC - Abnormal; Notable for the following:    Color, Urine AMBER (*) BIOCHEMICALS MAY BE AFFECTED BY COLOR   APPearance CLOUDY (*)    Hgb urine dipstick LARGE (*)    All other components within normal limits  URINE MICROSCOPIC-ADD ON - Abnormal; Notable for the following:    Squamous Epithelial / LPF MANY (*)    Bacteria, UA MANY (*)    All other components within normal limits  PREGNANCY, URINE  URINE CULTURE   Dg Abd 1 View  02/10/2011  *RADIOLOGY REPORT*  Clinical Data: None renal calculus.  Continued flank and pelvic pain.  ABDOMEN - 1 VIEW  Comparison: 12/31/2010 CT scan.  Findings: Bilateral nephrolithiasis is faintly seen to be extensive stool projecting over the kidneys.  There is previously a 3 mm right UPJ stone, which I suspect is projecting over the right iliac bone in the vicinity of the iliac vessel crossover.  Bilateral pelvic phleboliths were observed. Transitional L5 vertebral body noted.  IMPRESSION:  1.  Bilateral nephrolithiasis is faintly seen through the colonic stool projecting over the kidneys. 2.  The previously seen right UVJ stone probably projects over the right iliac bone in the vicinity of the iliac vessel crossover.  Original Report Authenticated By:  Dellia Cloud, M.D.     1. Dysuria   2. Abdominal  pain, other specified site   3. Hematuria       MDM  Discussed with Dr.Molpus before dc.  Scheduled outpatient renal US and pelvic US for tomorrow,  Given pt history of both renal stones and ovarian cyst.  Given hematuria,  Favor stone, despite pt report that symptoms are different from previous episodes of kidney stone passage.        Candis Musa, PA 02/11/11 [phone]

## 2011-02-10 NOTE — ED Notes (Signed)
Pt states that she started getting sick over the weekend with right lower abd pain, started n/v today, pt denies any fever, states that she has had pain like this before and was told that she had kidney stones.

## 2011-02-11 ENCOUNTER — Other Ambulatory Visit (HOSPITAL_COMMUNITY): Payer: Medicaid Other

## 2011-02-11 ENCOUNTER — Other Ambulatory Visit (HOSPITAL_COMMUNITY): Payer: Self-pay | Admitting: Emergency Medicine

## 2011-02-11 ENCOUNTER — Inpatient Hospital Stay (HOSPITAL_COMMUNITY): Admit: 2011-02-11 | Payer: Medicaid Other

## 2011-02-11 DIAGNOSIS — R52 Pain, unspecified: Secondary | ICD-10-CM

## 2011-02-11 MED ORDER — ONDANSETRON HCL 4 MG PO TABS: 4.0000 mg | ORAL_TABLET | Freq: Three times a day (TID) | ORAL | Status: DC | PRN

## 2011-02-11 MED ORDER — OXYCODONE-ACETAMINOPHEN 5-325 MG PO TABS: 1.0000 | ORAL_TABLET | ORAL | Status: AC | PRN

## 2011-02-11 MED ORDER — OXYCODONE-ACETAMINOPHEN 5-325 MG PO TABS
1.0000 | ORAL_TABLET | Freq: Once | ORAL | Status: AC
  Administered 2011-02-11: 1 via ORAL
  Filled 2011-02-11: qty 1

## 2011-02-11 MED ORDER — HYDROMORPHONE HCL PF 1 MG/ML IJ SOLN: 1.0000 mg | Freq: Once | INTRAMUSCULAR | Status: DC

## 2011-02-11 MED ORDER — PROMETHAZINE HCL 25 MG PO TABS: 25.0000 mg | ORAL_TABLET | Freq: Four times a day (QID) | ORAL | Status: DC | PRN

## 2011-02-11 MED ORDER — HYDROMORPHONE HCL PF 2 MG/ML IJ SOLN
INTRAMUSCULAR | Status: AC
  Administered 2011-02-11: 1 mg
  Filled 2011-02-11: qty 1

## 2011-02-11 MED ORDER — IBUPROFEN 800 MG PO TABS
800.0000 mg | ORAL_TABLET | Freq: Once | ORAL | Status: DC
  Filled 2011-02-11: qty 1

## 2011-02-11 MED ORDER — TAMSULOSIN HCL 0.4 MG PO CAPS
0.4000 mg | ORAL_CAPSULE | Freq: Once | ORAL | Status: AC
  Administered 2011-02-11: 0.4 mg via ORAL
  Filled 2011-02-11: qty 1

## 2011-02-11 MED ORDER — TAMSULOSIN HCL 0.4 MG PO CAPS
ORAL_CAPSULE | ORAL | Status: AC
  Filled 2011-02-11: qty 1

## 2011-02-11 MED ORDER — OXYCODONE-ACETAMINOPHEN 5-325 MG PO TABS: 1.0000 | ORAL_TABLET | ORAL | Status: DC | PRN

## 2011-02-11 NOTE — ED Provider Notes (Signed)
Medical screening examination/treatment/procedure(s) were performed by non-physician practitioner and as supervising physician I was immediately available for consultation/collaboration.   Hanley Seamen, MD 02/11/11 [phone]

## 2011-02-11 NOTE — ED Notes (Signed)
Pt states she took 800 ibuprofen at 12 noon today and then took another 1200 mg at 1730 tonight.   Informed pt I would check to see if another 800 mg that is ordered at this time is safe.

## 2011-02-12 LAB — URINE CULTURE: Culture  Setup Time: 201301030112

## 2011-02-22 ENCOUNTER — Encounter (HOSPITAL_COMMUNITY): Payer: Self-pay | Admitting: *Deleted

## 2011-02-22 ENCOUNTER — Emergency Department (HOSPITAL_COMMUNITY)
Admission: EM | Admit: 2011-02-22 | Discharge: 2011-02-22 | Disposition: A | Discharge status: Home / Self Care | Payer: Medicaid Other | Attending: Emergency Medicine | Admitting: Emergency Medicine

## 2011-02-22 ENCOUNTER — Emergency Department (HOSPITAL_COMMUNITY): Payer: Medicaid Other

## 2011-02-22 DIAGNOSIS — F17200 Nicotine dependence, unspecified, uncomplicated: Secondary | ICD-10-CM | POA: Insufficient documentation

## 2011-02-22 DIAGNOSIS — R10813 Right lower quadrant abdominal tenderness: Secondary | ICD-10-CM | POA: Insufficient documentation

## 2011-02-22 DIAGNOSIS — M545 Low back pain, unspecified: Secondary | ICD-10-CM | POA: Insufficient documentation

## 2011-02-22 DIAGNOSIS — Z9851 Tubal ligation status: Secondary | ICD-10-CM | POA: Insufficient documentation

## 2011-02-22 DIAGNOSIS — R10819 Abdominal tenderness, unspecified site: Secondary | ICD-10-CM | POA: Insufficient documentation

## 2011-02-22 DIAGNOSIS — R109 Unspecified abdominal pain: Secondary | ICD-10-CM | POA: Insufficient documentation

## 2011-02-22 DIAGNOSIS — R10811 Right upper quadrant abdominal tenderness: Secondary | ICD-10-CM | POA: Insufficient documentation

## 2011-02-22 DIAGNOSIS — Z87442 Personal history of urinary calculi: Secondary | ICD-10-CM | POA: Insufficient documentation

## 2011-02-22 DIAGNOSIS — R112 Nausea with vomiting, unspecified: Secondary | ICD-10-CM | POA: Insufficient documentation

## 2011-02-22 LAB — PREGNANCY, URINE: Preg Test, Ur: NEGATIVE

## 2011-02-22 LAB — POCT I-STAT TROPONIN I: Troponin i, poc: 0.02 ng/mL (ref 0.00–0.08)

## 2011-02-22 LAB — URINALYSIS, ROUTINE W REFLEX MICROSCOPIC
Glucose, UA: NEGATIVE mg/dL
Ketones, ur: NEGATIVE mg/dL
Protein, ur: NEGATIVE mg/dL
Urobilinogen, UA: 0.2 mg/dL (ref 0.0–1.0)

## 2011-02-22 LAB — URINE MICROSCOPIC-ADD ON

## 2011-02-22 LAB — BASIC METABOLIC PANEL
Calcium: 10 mg/dL (ref 8.4–10.5)
GFR calc Af Amer: >90 mL/min (ref >90)
GFR calc non Af Amer: >90 mL/min (ref >90)
Potassium: 3.5 mEq/L (ref 3.5–5.1)
Sodium: 138 mEq/L (ref 135–145)

## 2011-02-22 MED ORDER — HYDROMORPHONE HCL PF 1 MG/ML IJ SOLN
1.0000 mg | Freq: Once | INTRAMUSCULAR | Status: AC
  Administered 2011-02-22: 1 mg via INTRAVENOUS
  Filled 2011-02-22: qty 1

## 2011-02-22 MED ORDER — PROMETHAZINE HCL 25 MG/ML IJ SOLN
25.0000 mg | Freq: Once | INTRAMUSCULAR | Status: AC
  Administered 2011-02-22: 25 mg via INTRAVENOUS
  Filled 2011-02-22: qty 1

## 2011-02-22 MED ORDER — DIPHENHYDRAMINE HCL 50 MG/ML IJ SOLN
25.0000 mg | Freq: Once | INTRAMUSCULAR | Status: AC
Start: 2011-02-22 — End: 2011-02-22
  Administered 2011-02-22: 25 mg via INTRAVENOUS
  Filled 2011-02-22: qty 1

## 2011-02-22 MED ORDER — DROPERIDOL 2.5 MG/ML IJ SOLN
1.2500 mg | Freq: Once | INTRAMUSCULAR | Status: AC
  Administered 2011-02-22: 1.25 mg via INTRAVENOUS
  Filled 2011-02-22: qty 2

## 2011-02-22 MED ORDER — OXYCODONE-ACETAMINOPHEN 5-325 MG PO TABS
2.0000 | ORAL_TABLET | ORAL | Status: AC | PRN
Start: 2011-02-22 — End: 2011-03-04

## 2011-02-22 MED ORDER — SODIUM CHLORIDE 0.9 % IV BOLUS (SEPSIS)
1000.0000 mL | Freq: Once | INTRAVENOUS | Status: AC
  Administered 2011-02-22: 1000 mL via INTRAVENOUS

## 2011-02-22 MED ORDER — IBUPROFEN 800 MG PO TABS: 800.0000 mg | ORAL_TABLET | Freq: Three times a day (TID) | ORAL | Status: AC

## 2011-02-22 MED ORDER — HYDROMORPHONE HCL PF 2 MG/ML IJ SOLN
2.0000 mg | Freq: Once | INTRAMUSCULAR | Status: AC
  Administered 2011-02-22: 2 mg via INTRAVENOUS
  Filled 2011-02-22: qty 1

## 2011-02-22 NOTE — ED Notes (Signed)
Pt witnessed by staff walking in hallway, laughing and smiling w. Another visitor.  Once in room, called out and requested meds for pain, upon entering room pt moaning in bed, c/o severe pain.  When asked about pain stated it comes on all of a sudden. Pt repositioned self in bed, while meds were administered.  Also c/o nausea.  md notified.

## 2011-02-22 NOTE — ED Notes (Signed)
rn to room to d/c pt.  Pt became upset, when she was told she was not getting any prescriptions for narcotics, "I'm done with you"  "i want to seen my doctor", pt became irate and verbally abusive to staff member.  rn exited and told md of pt's request.  md to exam room and gave pt script for narcotic.  Pt drinking ginger ale and dressing self in exam room, could be overheard mumbling to self about filing complaints, and that she will be back because of her pain.  Was reminded by md that she is scheduled for ultrasound in the am.  She stated she will return

## 2011-02-22 NOTE — ED Provider Notes (Signed)
History  This chart was scribed for Glynn Octave, MD by Bennett Scrape. This patient was seen in room APA01/APA01 and the patient's care was started at 5:00PM.  CSN: [id number]  Arrival date & time 02/22/11  1545   First MD Initiated Contact with Patient 02/22/11 1649      Chief Complaint  Patient presents with  . Nephrolithiasis    The history is provided by the patient. No language interpreter was used.   [name] is a 26 y.o. female who presents to the Emergency Department complaining of 3 days of gradual onset, gradually worsening, constant right flank pain that radiates to the right inguinal area that she associates to nephrolithiasis. She also c/o 12 hours of nausea, hematuria and 6 episodes of emesis. She denies any modifying factors. She has been taking IB profen and valium with no improvement in pain at home. Pt states that she passed two kidney stones 5 days ago was fine until Friday when the right flank pressure gradually returned. She reports that she has been to see Dr. Mellody Drown in Urology several times for nephrolithiasis but states that she is never in the process of passing a kidney stone when she is seen. She has had a lithotripsy and a stent placed before but it was removed due to pregnancy. She has a h/o asthma and ovarian cyst but she states that this pain is different from the pain that she experienced with an ovarian cyst because she had vaginal discharge with the cysts.   Past Medical History  Diagnosis Date  . Ovarian cyst   . Kidney stones   . UTI (lower urinary tract infection)   . Calcium oxalate renal stones     Past Surgical History  Procedure Date  . Ovarian cyst removal   . Lithotripsy   . Laparoscopic tubal ligation   . Cholecystectomy   . Appendectomy     No family history on file.  History  Substance Use Topics  . Smoking status: Current Everyday Smoker -- 0.5 packs/day  . Smokeless tobacco: Not on file  . Alcohol Use: No    OB  History    Grav Para Term Preterm Abortions TAB SAB Ect Mult Living                  Review of Systems  A complete 10 system review of systems was obtained and is otherwise negative except as noted in the HPI.   Allergies  Iohexol; Zofran; Morphine and related; and Toradol  Home Medications   Current Outpatient Rx  Name Route Sig Dispense Refill  . DIMENHYDRINATE 50 MG PO TABS Oral Take 50 mg by mouth every 8 (eight) hours as needed. For nausea    . IBUPROFEN 800 MG PO TABS Oral Take 800 mg by mouth every 8 (eight) hours as needed. For pain    . TOPIRAMATE 50 MG PO TABS Oral Take 50 mg by mouth 2 (two) times daily.      . IBUPROFEN 800 MG PO TABS Oral Take 1 tablet (800 mg total) by mouth 3 (three) times daily. 21 tablet 0  . OXYCODONE-ACETAMINOPHEN 5-325 MG PO TABS Oral Take 1 tablet by mouth every 4 (four) hours as needed for pain. 20 tablet 0  . OXYCODONE-ACETAMINOPHEN 5-325 MG PO TABS Oral Take 2 tablets by mouth every 4 (four) hours as needed for pain. 10 tablet 0    Triage Vitals: BP 138/81  Pulse 116  Temp(Src) 98.3 F (36.8 C) (Oral)  Resp 20  Ht 5\' 4"  (1.626 m)  Wt 207 lb (93.895 kg)  BMI 35.53 kg/m2  SpO2 100%  LMP 01/28/2011  Physical Exam  Nursing note and vitals reviewed. Constitutional: She is oriented to person, place, and time. She appears well-developed and well-nourished.       Uncomfortable  HENT:  Head: Normocephalic.  Eyes: Conjunctivae and EOM are normal.  Neck: Normal range of motion. Neck supple.  Cardiovascular: Normal rate, regular rhythm and normal heart sounds.   Pulmonary/Chest: Effort normal and breath sounds normal. No respiratory distress.  Abdominal: Soft. There is tenderness.       Right CVA, right flank, RUQ and RLQ tenderness  Musculoskeletal: Normal range of motion. She exhibits no edema.  Neurological: She is alert and oriented to person, place, and time. No cranial nerve deficit.  Skin: Skin is warm and dry. No rash noted.    Psychiatric: She has a normal mood and affect. Her behavior is normal.    ED Course  Procedures (including critical care time)  DIAGNOSTIC STUDIES: Oxygen Saturation is 100% on room air, normal by my interpretation.    COORDINATION OF CARE: 5:04PM-Will give anti-nausea medication. Discussed treatment plan with pt at bedside and pt agreed to plan. 8:26PM-Pt rechecked and ready to be discharged.    Labs Reviewed  URINALYSIS, ROUTINE W REFLEX MICROSCOPIC - Abnormal; Notable for the following:    APPearance CLOUDY (*)    Specific Gravity, Urine >1.030 (*)    Hgb urine dipstick TRACE (*)    All other components within normal limits  URINE MICROSCOPIC-ADD ON - Abnormal; Notable for the following:    Squamous Epithelial / LPF MANY (*)    Bacteria, UA MANY (*)    All other components within normal limits  PREGNANCY, URINE  POCT I-STAT TROPONIN I  BASIC METABOLIC PANEL  I-STAT, CHEM 8  URINE CULTURE   Dg Abd 1 View  02/22/2011  *RADIOLOGY REPORT*  Clinical Data: Right flank pain, history of renal calculi  ABDOMEN - 1 VIEW  Comparison: 02/10/2011, 12/31/2010  Findings: Scattered small sub centimeter renal calculi noted in the mid to lower poles bilaterally.  Pelvic calcifications present bilaterally as well, similar configuration to 02/10/2011. Nonobstructive bowel gas pattern.  No acute osseous finding.  IMPRESSION: Bilateral nephrolithiasis.  Stable pelvic calcifications.  Original Report Authenticated By: Judie Petit. Ruel Favors, M.D.     1. Flank pain       MDM  History kidney stones with right lower back and flank pain radiating to the right groin is with nausea and vomiting. Feels like previous kidney stones. Patient appears uncomfortable. Review shows that she has had 18 CT scans in the past 6 years.  Urinalysis is a poor sample but is not show any gross hematuria or infection.  We'll schedule patient for renal ultrasound tomorrow., Check kidney function and treat symptoms.  We'll  try to avoid radiation.  Creatinine stable. Patient still complaining of pain in my assessment but noted to be joking with other patients and talking on the phone. Ultrasound is arranged for tomorrow.  Pain controlled, patient sleeping comfortably. Has followup scheduled with Dr. Rito Ehrlich on January 20.   I personally performed the services described in this documentation, which was scribed in my presence.  The recorded information has been reviewed and considered.   Glynn Octave, MD 02/22/11 2104

## 2011-02-22 NOTE — ED Notes (Signed)
Pt states that she passed two kidney stones on Wednesday was fine until Friday when the pressure starting coming back, pt c/o pain to right flank pain that radiates around to right groin area. Admits to n/v

## 2011-02-22 NOTE — ED Notes (Signed)
Pt exited w/o disturbance by ems doors.

## 2011-02-23 ENCOUNTER — Inpatient Hospital Stay (HOSPITAL_COMMUNITY): Admit: 2011-02-23 | Payer: Medicaid Other

## 2011-02-23 ENCOUNTER — Ambulatory Visit (HOSPITAL_COMMUNITY): Payer: Medicaid Other

## 2011-02-23 LAB — URINE CULTURE: Culture  Setup Time: 201301141505

## 2011-02-25 ENCOUNTER — Emergency Department (HOSPITAL_COMMUNITY): Payer: Medicaid Other

## 2011-02-25 ENCOUNTER — Encounter (HOSPITAL_COMMUNITY): Payer: Self-pay | Admitting: *Deleted

## 2011-02-25 ENCOUNTER — Emergency Department (HOSPITAL_COMMUNITY)
Admission: EM | Admit: 2011-02-25 | Discharge: 2011-02-25 | Disposition: A | Discharge status: Home / Self Care | Payer: Medicaid Other | Attending: Emergency Medicine | Admitting: Emergency Medicine

## 2011-02-25 DIAGNOSIS — F17200 Nicotine dependence, unspecified, uncomplicated: Secondary | ICD-10-CM | POA: Insufficient documentation

## 2011-02-25 DIAGNOSIS — R109 Unspecified abdominal pain: Secondary | ICD-10-CM | POA: Insufficient documentation

## 2011-02-25 DIAGNOSIS — N390 Urinary tract infection, site not specified: Secondary | ICD-10-CM | POA: Insufficient documentation

## 2011-02-25 DIAGNOSIS — Z87442 Personal history of urinary calculi: Secondary | ICD-10-CM | POA: Insufficient documentation

## 2011-02-25 DIAGNOSIS — Z9889 Other specified postprocedural states: Secondary | ICD-10-CM | POA: Insufficient documentation

## 2011-02-25 MED ORDER — PROMETHAZINE HCL 25 MG/ML IJ SOLN
INTRAMUSCULAR | Status: DC
Start: 2011-02-25 — End: 2011-02-25
  Filled 2011-02-25: qty 1

## 2011-02-25 MED ORDER — CIPROFLOXACIN HCL 250 MG PO TABS: 250.0000 mg | ORAL_TABLET | Freq: Two times a day (BID) | ORAL | Status: AC

## 2011-02-25 MED ORDER — HYDROMORPHONE HCL PF 1 MG/ML IJ SOLN
2.0000 mg | Freq: Once | INTRAMUSCULAR | Status: AC
  Administered 2011-02-25: 2 mg via INTRAVENOUS
  Filled 2011-02-25: qty 2

## 2011-02-25 MED ORDER — PROMETHAZINE HCL 25 MG/ML IJ SOLN
25.0000 mg | Freq: Once | INTRAMUSCULAR | Status: AC
  Administered 2011-02-25: 25 mg via INTRAVENOUS
  Filled 2011-02-25: qty 1

## 2011-02-25 MED ORDER — HYDROMORPHONE HCL PF 1 MG/ML IJ SOLN
1.0000 mg | Freq: Once | INTRAMUSCULAR | Status: AC
  Administered 2011-02-25: 1 mg via INTRAVENOUS
  Filled 2011-02-25: qty 1

## 2011-02-25 MED ORDER — PROMETHAZINE HCL 25 MG/ML IJ SOLN
12.5000 mg | Freq: Once | INTRAMUSCULAR | Status: AC
  Administered 2011-02-25: 12.5 mg via INTRAVENOUS

## 2011-02-25 MED ORDER — PROMETHAZINE HCL 25 MG PO TABS: 12.5000 mg | ORAL_TABLET | Freq: Four times a day (QID) | ORAL | Status: DC | PRN

## 2011-02-25 MED ORDER — OXYCODONE-ACETAMINOPHEN 5-325 MG PO TABS: 1.0000 | ORAL_TABLET | ORAL | Status: AC | PRN

## 2011-02-25 MED ORDER — PROMETHAZINE HCL 25 MG/ML IJ SOLN
INTRAMUSCULAR | Status: AC
  Administered 2011-02-25: 12.5 mg via INTRAVENOUS
  Filled 2011-02-25: qty 1

## 2011-02-25 NOTE — ED Notes (Signed)
Kidney stone , pain , nausea   Rt flank pain .  Vomiting.  Seen here recently for same

## 2011-02-25 NOTE — ED Provider Notes (Addendum)
This chart was scribed for [employer]. Colon Branch, MD by Williemae Natter. The patient was seen in room APA08/APA08 at 1328  CSN: [id number]  Arrival date & time 02/25/11  1204   First MD Initiated Contact with Patient 02/25/11 1323      Chief Complaint  Patient presents with  . Flank Pain    (Consider location/radiation/quality/duration/timing/severity/associated sxs/prior treatment) HPI  Patient with a history of kidney stones who is here with right flank pain that she has had intermittently for several weeks. Denies hematuria, fever, chills. She has experienced nausea and vomiting. She is seen on occasion by urology, Dr. Jerre Simon, but has not required an intervention for several years.   [facility]  Dr. Jerre Simon   Past Medical History  Diagnosis Date  . Ovarian cyst   . Kidney stones   . UTI (lower urinary tract infection)   . Calcium oxalate renal stones     Past Surgical History  Procedure Date  . Ovarian cyst removal   . Lithotripsy   . Laparoscopic tubal ligation   . Cholecystectomy   . Appendectomy     History reviewed. No pertinent family history.  History  Substance Use Topics  . Smoking status: Current Everyday Smoker -- 0.5 packs/day  . Smokeless tobacco: Not on file  . Alcohol Use: No    OB History    Grav Para Term Preterm Abortions TAB SAB Ect Mult Living                  Review of Systems 10 Systems reviewed and are negative for acute change except as noted in the HPI.  Allergies  Iohexol; Zofran; Morphine and related; and Toradol  Home Medications   Current Outpatient Rx  Name Route Sig Dispense Refill  . DIMENHYDRINATE 50 MG PO TABS Oral Take 50 mg by mouth every 8 (eight) hours as needed. For nausea    . IBUPROFEN 800 MG PO TABS Oral Take 1 tablet (800 mg total) by mouth 3 (three) times daily. 21 tablet 0  . OXYCODONE-ACETAMINOPHEN 5-325 MG PO TABS Oral Take 2 tablets by mouth every 4 (four) hours as needed for pain. 10 tablet  0  . TOPIRAMATE 50 MG PO TABS Oral Take 50 mg by mouth 2 (two) times daily.       Pulse oximetry on room air is 100%. Normal by my interpretation.  BP 150/86  Pulse 87  Temp(Src) 98.4 F (36.9 C) (Oral)  Resp 18  Ht 5\' 4"  (1.626 m)  Wt 210 lb (95.255 kg)  BMI 36.05 kg/m2  SpO2 100%  LMP 01/28/2011  Physical Exam  Nursing note and vitals reviewed. Constitutional: She is oriented to person, place, and time. She appears well-developed and well-nourished. No distress.  HENT:  Head: Normocephalic.  Right Ear: External ear normal.  Left Ear: External ear normal.  Nose: Nose normal.  Mouth/Throat: Oropharynx is clear and moist.  Eyes: EOM are normal. Pupils are equal, round, and reactive to light.  Neck: Normal range of motion. Neck supple.  Cardiovascular: Normal rate, normal heart sounds and intact distal pulses.   Pulmonary/Chest: Effort normal and breath sounds normal.  Abdominal: Soft. There is tenderness.       Right sided tenderness to palpation  Genitourinary:       Right cva tenderness to percussion  Musculoskeletal: Normal range of motion.  Neurological: She is alert and oriented to person, place, and time. She has normal reflexes.  Skin: Skin is warm and  dry.    ED Course  Procedures (including critical care time)  Results for orders placed during the hospital encounter of 02/22/11  URINALYSIS, ROUTINE W REFLEX MICROSCOPIC      Component Value Range   Color, Urine YELLOW  YELLOW    APPearance CLOUDY (*) CLEAR    Specific Gravity, Urine >1.030 (*) 1.005 - 1.030    pH 6.0  5.0 - 8.0    Glucose, UA NEGATIVE  NEGATIVE (mg/dL)   Hgb urine dipstick TRACE (*) NEGATIVE    Bilirubin Urine NEGATIVE  NEGATIVE    Ketones, ur NEGATIVE  NEGATIVE (mg/dL)   Protein, ur NEGATIVE  NEGATIVE (mg/dL)   Urobilinogen, UA 0.2  0.0 - 1.0 (mg/dL)   Nitrite NEGATIVE  NEGATIVE    Leukocytes, UA NEGATIVE  NEGATIVE   PREGNANCY, URINE      Component Value Range   Preg Test, Ur NEGATIVE     URINE MICROSCOPIC-ADD ON      Component Value Range   Squamous Epithelial / LPF MANY (*) RARE    WBC, UA 7-10  <3 (WBC/hpf)   RBC / HPF 7-10  <3 (RBC/hpf)   Bacteria, UA MANY (*) RARE   POCT I-STAT TROPONIN I      Component Value Range   Troponin i, poc 0.02  0.00 - 0.08 (ng/mL)   Comment 3           BASIC METABOLIC PANEL      Component Value Range   Sodium 138  135 - 145 (mEq/L)   Potassium 3.5  3.5 - 5.1 (mEq/L)   Chloride 107  96 - 112 (mEq/L)   CO2 23  19 - 32 (mEq/L)   Glucose, Bld 95  70 - 99 (mg/dL)   BUN 10  6 - 23 (mg/dL)   Creatinine, Ser 1.61  0.50 - 1.10 (mg/dL)   Calcium 09.6  8.4 - 10.5 (mg/dL)   GFR calc non Af Amer >90  >90 (mL/min)   GFR calc Af Amer >90  >90 (mL/min)  URINE CULTURE      Component Value Range   Specimen Description URINE, CLEAN CATCH     Special Requests NONE     Setup Time [id number]     Colony Count >=100,000 COLONIES/ML     Culture       Value: Multiple bacterial morphotypes present, none predominant. Suggest appropriate recollection if clinically indicated.   Report Status 02/24/2011 FINAL     US Renal  02/25/2011  *RADIOLOGY REPORT*  Clinical Data: Right flank pain, history kidney stones  RENAL/URINARY TRACT ULTRASOUND COMPLETE  Comparison:  10/11/2009  Findings:  Right Kidney:  11.4 cm length.  Normal cortical thickness echogenicity.  No mass, hydronephrosis or shadowing calcification.  Left Kidney:  11.8 cm length.  Normal cortical thickness and echogenicity.  No mass, hydronephrosis or shadowing calcification. No perinephric fluid.  Bladder:  Normal appearance.  Bilateral ureteral jets identified.  IMPRESSION: Normal exam.  Original Report Authenticated By: Lollie Marrow, M.D.    MDM  Patient with h/o kidney stones here with recurrent right flank pain. Urine with many bacteria and recent culture showing multiple species. Will treat  with antibiotics and provide analgesics. She will follow up with Dr. Jerre Simon. Pain management was a  challenge in the ER however was able improve pain.Pt feels improved after observation and/or treatment in ED.Pt stable in ED with no significant deterioration in condition.The patient appears reasonably screened and/or stabilized for discharge and I doubt any other  medical condition or other [facility] requiring further screening, evaluation, or treatment in the ED at this  time prior to discharge.  I personally performed the services described in this documentation, which was scribed in my presence. The recorded information has been reviewed and considered.  .MDM Reviewed: previous chart, nursing note and vitals Reviewed previous: labs and CT scan Interpretation: labs and ultrasound Total time providing critical care: 40.    Nicoletta Dress. Colon Branch, MD 02/25/11 1550  Nicoletta Dress. Colon Branch, MD 03/12/11 2127

## 2011-03-23 ENCOUNTER — Encounter (HOSPITAL_COMMUNITY): Payer: Self-pay

## 2011-03-23 ENCOUNTER — Emergency Department (HOSPITAL_COMMUNITY): Payer: Medicaid Other

## 2011-03-23 ENCOUNTER — Emergency Department (HOSPITAL_COMMUNITY)
Admission: EM | Admit: 2011-03-23 | Discharge: 2011-03-23 | Disposition: A | Discharge status: Home / Self Care | Payer: Medicaid Other | Attending: Emergency Medicine | Admitting: Emergency Medicine

## 2011-03-23 DIAGNOSIS — L0231 Cutaneous abscess of buttock: Secondary | ICD-10-CM | POA: Insufficient documentation

## 2011-03-23 DIAGNOSIS — R509 Fever, unspecified: Secondary | ICD-10-CM | POA: Insufficient documentation

## 2011-03-23 DIAGNOSIS — R109 Unspecified abdominal pain: Secondary | ICD-10-CM | POA: Insufficient documentation

## 2011-03-23 DIAGNOSIS — L03317 Cellulitis of buttock: Secondary | ICD-10-CM | POA: Insufficient documentation

## 2011-03-23 DIAGNOSIS — L0291 Cutaneous abscess, unspecified: Secondary | ICD-10-CM

## 2011-03-23 LAB — URINALYSIS, ROUTINE W REFLEX MICROSCOPIC
Bilirubin Urine: NEGATIVE
Glucose, UA: NEGATIVE mg/dL
Hgb urine dipstick: NEGATIVE
Ketones, ur: NEGATIVE mg/dL
Nitrite: NEGATIVE
Specific Gravity, Urine: 1.015 (ref 1.005–1.030)
pH: 6.5 (ref 5.0–8.0)

## 2011-03-23 LAB — CBC
HCT: 38.7 % (ref 36.0–46.0)
Hemoglobin: 13 g/dL (ref 12.0–15.0)
MCHC: 33.6 g/dL (ref 30.0–36.0)
RDW: 13.3 % (ref 11.5–15.5)
WBC: 7 10*3/uL (ref 4.0–10.5)

## 2011-03-23 LAB — DIFFERENTIAL
Basophils Absolute: 0 10*3/uL (ref 0.0–0.1)
Basophils Relative: 0 % (ref 0–1)
Lymphocytes Relative: 27 % (ref 12–46)
Monocytes Absolute: 0.4 10*3/uL (ref 0.1–1.0)
Monocytes Relative: 5 % (ref 3–12)
Neutro Abs: 4.6 10*3/uL (ref 1.7–7.7)
Neutrophils Relative %: 67 % (ref 43–77)

## 2011-03-23 LAB — BASIC METABOLIC PANEL
CO2: 27 mEq/L (ref 19–32)
Chloride: 104 mEq/L (ref 96–112)
Creatinine, Ser: 0.73 mg/dL (ref 0.50–1.10)
GFR calc Af Amer: >90 mL/min (ref >90)
Potassium: 4.1 mEq/L (ref 3.5–5.1)

## 2011-03-23 MED ORDER — METOCLOPRAMIDE HCL 10 MG PO TABS
10.0000 mg | ORAL_TABLET | Freq: Once | ORAL | Status: AC
  Administered 2011-03-23: 10 mg via ORAL
  Filled 2011-03-23: qty 1

## 2011-03-23 MED ORDER — HYDROCODONE-ACETAMINOPHEN 5-325 MG PO TABS: 1.0000 | ORAL_TABLET | ORAL | Status: AC | PRN

## 2011-03-23 MED ORDER — OXYCODONE-ACETAMINOPHEN 5-325 MG PO TABS
1.0000 | ORAL_TABLET | Freq: Once | ORAL | Status: AC
  Administered 2011-03-23: 1 via ORAL
  Filled 2011-03-23: qty 1

## 2011-03-23 MED ORDER — IBUPROFEN 800 MG PO TABS: 800.0000 mg | ORAL_TABLET | Freq: Three times a day (TID) | ORAL | Status: AC

## 2011-03-23 MED ORDER — LIDOCAINE HCL (PF) 1 % IJ SOLN
INTRAMUSCULAR | Status: AC
  Administered 2011-03-23: 5 mL
  Filled 2011-03-23: qty 5

## 2011-03-23 MED ORDER — HYDROCODONE-ACETAMINOPHEN 5-325 MG PO TABS
2.0000 | ORAL_TABLET | Freq: Once | ORAL | Status: AC
  Administered 2011-03-23: 2 via ORAL
  Filled 2011-03-23: qty 2

## 2011-03-23 NOTE — ED Notes (Signed)
C/o abscess to rt butt and abd pain as well. C/o n/v as well. And noted.ambulated to triage without difficulty.

## 2011-03-23 NOTE — ED Provider Notes (Signed)
History    This chart was scribed for [employer]. Colon Branch, MD, MD by Smitty Pluck. The patient was seen in room APA09 and the patient's care was started at 6:24PM.   CSN: [id number]  Arrival date & time 03/23/11  1627   First MD Initiated Contact with Patient 03/23/11 1757      Chief Complaint  Patient presents with  . Recurrent Skin Infections  . Abdominal Pain    (Consider location/radiation/quality/duration/timing/severity/associated sxs/prior treatment) Patient is a 26 y.o. female presenting with abdominal pain. The history is provided by the patient.  Abdominal Pain The primary symptoms of the illness include abdominal pain.   [name] is a 26 y.o. female who presents to the Emergency Department complaining of moderate lower abdominal pain onset 3 days ago. Pt has been seen in ED before for abdominal pain with negative labs. She reports that she had nl bowel movement today. Pt had fever 2 days ago of 102.6 and 1 day ago of 102.4. She took tylenol for fevers with relief. Pt reports having abscess on right buttocks that she noticed 2 days ago. She has PCP appointment for 04-22-11. PCP is Dr. Evonnie Dawes.  The pain has been constant since onset without radiation.   Past Medical History  Diagnosis Date  . Ovarian cyst   . Kidney stones   . UTI (lower urinary tract infection)   . Calcium oxalate renal stones     Past Surgical History  Procedure Date  . Ovarian cyst removal   . Lithotripsy   . Laparoscopic tubal ligation   . Cholecystectomy   . Appendectomy     No family history on file.  History  Substance Use Topics  . Smoking status: Current Everyday Smoker -- 0.5 packs/day  . Smokeless tobacco: Not on file  . Alcohol Use: No    OB History    Grav Para Term Preterm Abortions TAB SAB Ect Mult Living                  Review of Systems  Gastrointestinal: Positive for abdominal pain.  All other systems reviewed and are negative.   10 Systems reviewed and are  negative for acute change except as noted in the HPI.  Allergies  Iohexol; Zofran; Morphine and related; and Toradol  Home Medications   Current Outpatient Rx  Name Route Sig Dispense Refill  . DIMENHYDRINATE 50 MG PO TABS Oral Take 50 mg by mouth every 8 (eight) hours as needed. For nausea    . TOPIRAMATE 50 MG PO TABS Oral Take 50 mg by mouth 2 (two) times daily.        BP 115/97  Pulse 103  Temp(Src) 98.4 F (36.9 C) (Oral)  Resp 18  Ht 5\' 4"  (1.626 m)  Wt 210 lb (95.255 kg)  BMI 36.05 kg/m2  SpO2 100%  LMP 03/04/2011  Physical Exam  Nursing note and vitals reviewed. Constitutional: She is oriented to person, place, and time. She appears well-developed and well-nourished. No distress.  HENT:  Head: Normocephalic and atraumatic.  Eyes: EOM are normal. Pupils are equal, round, and reactive to light.  Neck: Normal range of motion. Neck supple.  Cardiovascular: Normal rate, regular rhythm and normal heart sounds.  Exam reveals no friction rub.   No murmur heard. Pulmonary/Chest: Effort normal and breath sounds normal. No respiratory distress.  Abdominal: Soft. Bowel sounds are normal. She exhibits no distension. There is no tenderness.  Musculoskeletal: Normal range of motion. She exhibits no  edema.  Neurological: She is alert and oriented to person, place, and time.  Skin: Skin is warm and dry.  Psychiatric: She has a normal mood and affect. Her behavior is normal.    ED Course  Procedures (including critical care time)  DIAGNOSTIC STUDIES: Oxygen Saturation is 100% on room air, normal by my interpretation.    COORDINATION OF CARE:  6:29 PM EDP ordered medication: Reglan 10 mg and Norco   Labs Reviewed  BASIC METABOLIC PANEL - Abnormal; Notable for the following:    Glucose, Bld 102 (*)    All other components within normal limits  URINALYSIS, ROUTINE W REFLEX MICROSCOPIC  CBC  DIFFERENTIAL   Dg Abd Acute W/chest  03/23/2011  *RADIOLOGY REPORT*  Clinical  Data: Lower abdominal pain.  Nausea, vomiting, and fever.  ACUTE ABDOMEN SERIES (ABDOMEN 2 VIEW & CHEST 1 VIEW)  Comparison: Abdominal radiograph dated 02/22/2011 and chest x-ray dated 05/11/2010  Findings: Heart size and vascularity are normal and the lungs are clear.  No free air or free fluid in the abdomen.  Bowel gas pattern is normal.  Evidence of previous cholecystectomy. Small bilateral renal calculi.  No osseous abnormality.  IMPRESSION: Bilateral renal calculi.  Otherwise benign-appearing abdomen and chest.  Original Report Authenticated By: Gwynn Burly, M.D.    INCISION AND DRAINAGE Performed by: Maxwell Caul. Consent: Verbal consent obtained. Risks and benefits: risks, benefits and alternatives were discussed Type: abscess  Body area: upper right gluteal fold Anesthesia: local infiltration  Local anesthetic: lidocaine 1% w/o epinephrine  Anesthetic total: 4 ml  Complexity: complex Blunt dissection to break up loculations  Drainage: purulent  Drainage amount: moderate Packing material: 1/4 in iodoform gauze  Patient tolerance: Patient tolerated the procedure well with no immediate complications.   I was asked to perform I&D of an abscess by the EDP.  This was my only involvement in this patient's care.     MDM  Patient with c/o abdominal pain and an abscess to the gluteal fold. Given analgesics with some improvement. Mid level practitioner did I&D on abscess. Pt stable in ED with no significant deterioration in condition.The patient appears reasonably screened and/or stabilized for discharge and I doubt any other medical condition or other [facility] requiring further screening, evaluation, or treatment in the ED at this time prior to discharge.   I personally performed the services described in this documentation, which was scribed in my presence. The recorded information has been reviewed and considered.   [name], [place] 03/23/11 2021  Medical screening  examination/treatment/procedure(s) were conducted as a shared visit with non-physician practitioner(s) and myself.  I personally evaluated the patient during the encounter  MDM Reviewed: nursing note, vitals and previous chart Interpretation: labs and x-ray     Nicoletta Dress. Colon Branch, MD 03/23/11 2141

## 2011-04-30 ENCOUNTER — Encounter (HOSPITAL_COMMUNITY): Payer: Self-pay | Admitting: *Deleted

## 2011-04-30 ENCOUNTER — Emergency Department (HOSPITAL_COMMUNITY)
Admission: EM | Admit: 2011-04-30 | Discharge: 2011-05-01 | Disposition: A | Discharge status: Home / Self Care | Payer: Medicaid Other | Attending: Emergency Medicine | Admitting: Emergency Medicine

## 2011-04-30 DIAGNOSIS — B9689 Other specified bacterial agents as the cause of diseases classified elsewhere: Secondary | ICD-10-CM | POA: Insufficient documentation

## 2011-04-30 DIAGNOSIS — F17200 Nicotine dependence, unspecified, uncomplicated: Secondary | ICD-10-CM | POA: Insufficient documentation

## 2011-04-30 DIAGNOSIS — R109 Unspecified abdominal pain: Secondary | ICD-10-CM | POA: Insufficient documentation

## 2011-04-30 DIAGNOSIS — Z87442 Personal history of urinary calculi: Secondary | ICD-10-CM | POA: Insufficient documentation

## 2011-04-30 DIAGNOSIS — A499 Bacterial infection, unspecified: Secondary | ICD-10-CM | POA: Insufficient documentation

## 2011-04-30 DIAGNOSIS — R112 Nausea with vomiting, unspecified: Secondary | ICD-10-CM | POA: Insufficient documentation

## 2011-04-30 DIAGNOSIS — R197 Diarrhea, unspecified: Secondary | ICD-10-CM | POA: Insufficient documentation

## 2011-04-30 DIAGNOSIS — Z9851 Tubal ligation status: Secondary | ICD-10-CM | POA: Insufficient documentation

## 2011-04-30 DIAGNOSIS — N760 Acute vaginitis: Secondary | ICD-10-CM | POA: Insufficient documentation

## 2011-04-30 NOTE — ED Notes (Signed)
Pt reports periumbilical abd pain starting this am with asso diarrhea

## 2011-05-01 ENCOUNTER — Emergency Department (HOSPITAL_COMMUNITY): Payer: Medicaid Other

## 2011-05-01 LAB — COMPREHENSIVE METABOLIC PANEL
AST: 19 U/L (ref 0–37)
BUN: 9 mg/dL (ref 6–23)
CO2: 23 mEq/L (ref 19–32)
Calcium: 9.8 mg/dL (ref 8.4–10.5)
Chloride: 102 mEq/L (ref 96–112)
Creatinine, Ser: 0.68 mg/dL (ref 0.50–1.10)
GFR calc Af Amer: >90 mL/min (ref >90)
GFR calc non Af Amer: >90 mL/min (ref >90)
Glucose, Bld: 93 mg/dL (ref 70–99)
Total Bilirubin: 0.4 mg/dL (ref 0.3–1.2)

## 2011-05-01 LAB — DIFFERENTIAL
Eosinophils Relative: 1 % (ref 0–5)
Lymphocytes Relative: 47 % — ABNORMAL HIGH (ref 12–46)
Lymphs Abs: 2.7 10*3/uL (ref 0.7–4.0)
Monocytes Absolute: 0.3 10*3/uL (ref 0.1–1.0)
Monocytes Relative: 6 % (ref 3–12)
Neutro Abs: 2.7 10*3/uL (ref 1.7–7.7)

## 2011-05-01 LAB — CBC
HCT: 38.7 % (ref 36.0–46.0)
Hemoglobin: 12.7 g/dL (ref 12.0–15.0)
MCV: 90 fL (ref 78.0–100.0)
WBC: 5.8 10*3/uL (ref 4.0–10.5)

## 2011-05-01 LAB — URINALYSIS, ROUTINE W REFLEX MICROSCOPIC
Nitrite: NEGATIVE
Protein, ur: NEGATIVE mg/dL
Urobilinogen, UA: 0.2 mg/dL (ref 0.0–1.0)

## 2011-05-01 LAB — WET PREP, GENITAL: Yeast Wet Prep HPF POC: NONE SEEN

## 2011-05-01 LAB — PREGNANCY, URINE: Preg Test, Ur: NEGATIVE

## 2011-05-01 LAB — URINE MICROSCOPIC-ADD ON

## 2011-05-01 MED ORDER — HYDROMORPHONE HCL PF 2 MG/ML IJ SOLN
2.0000 mg | Freq: Once | INTRAMUSCULAR | Status: AC
  Administered 2011-05-01: 2 mg via INTRAVENOUS
  Filled 2011-05-01: qty 1

## 2011-05-01 MED ORDER — OXYCODONE-ACETAMINOPHEN 5-325 MG PO TABS: 1.0000 | ORAL_TABLET | Freq: Four times a day (QID) | ORAL | Status: AC | PRN

## 2011-05-01 MED ORDER — METRONIDAZOLE 500 MG PO TABS: 500.0000 mg | ORAL_TABLET | Freq: Two times a day (BID) | ORAL | Status: AC

## 2011-05-01 MED ORDER — PROMETHAZINE HCL 25 MG/ML IJ SOLN
12.5000 mg | Freq: Once | INTRAMUSCULAR | Status: AC
  Administered 2011-05-01: 12.5 mg via INTRAVENOUS
  Filled 2011-05-01: qty 1

## 2011-05-01 MED ORDER — HYDROMORPHONE HCL PF 1 MG/ML IJ SOLN
1.0000 mg | Freq: Once | INTRAMUSCULAR | Status: AC
  Administered 2011-05-01: 1 mg via INTRAVENOUS
  Filled 2011-05-01: qty 1

## 2011-05-01 NOTE — ED Notes (Signed)
Patient ambulatory to bathroom to collect urine specimen. 

## 2011-05-01 NOTE — ED Notes (Signed)
Dr. Judd Lien in to speak with patient regarding not doing a CT scan due to patient having too many in the past.

## 2011-05-01 NOTE — Discharge Instructions (Signed)

## 2011-05-01 NOTE — ED Provider Notes (Signed)
History     CSN: [id number]  Arrival date & time 04/30/11  2239   First MD Initiated Contact with Patient 05/01/11 0008      Chief Complaint  Patient presents with  . Abdominal Pain    (Consider location/radiation/quality/duration/timing/severity/associated sxs/prior treatment) HPI Comments: Patient with history of chronic abd pain, kidney stones.    Patient is a 26 y.o. female presenting with abdominal pain. The history is provided by the patient.  Abdominal Pain The primary symptoms of the illness include abdominal pain, nausea, vomiting and diarrhea. The primary symptoms of the illness do not include fever. Episode onset: 3 days ago. The onset of the illness was gradual. The problem has been rapidly worsening.  The patient states that she believes she is currently not pregnant. Symptoms associated with the illness do not include chills.    Past Medical History  Diagnosis Date  . Ovarian cyst   . Kidney stones   . UTI (lower urinary tract infection)   . Calcium oxalate renal stones     Past Surgical History  Procedure Date  . Ovarian cyst removal   . Lithotripsy   . Laparoscopic tubal ligation   . Cholecystectomy   . Appendectomy     No family history on file.  History  Substance Use Topics  . Smoking status: Current Everyday Smoker -- 0.5 packs/day  . Smokeless tobacco: Not on file  . Alcohol Use: No    OB History    Grav Para Term Preterm Abortions TAB SAB Ect Mult Living                  Review of Systems  Constitutional: Negative for fever and chills.  Gastrointestinal: Positive for nausea, vomiting, abdominal pain and diarrhea.  All other systems reviewed and are negative.    Allergies  Iohexol; Zofran; Morphine and related; and Toradol  Home Medications   Current Outpatient Rx  Name Route Sig Dispense Refill  . DIMENHYDRINATE 50 MG PO TABS Oral Take 50 mg by mouth every 8 (eight) hours as needed. For nausea    . TOPIRAMATE 50 MG PO TABS  Oral Take 50 mg by mouth 2 (two) times daily.        BP 151/92  Temp(Src) 98 F (36.7 C) (Oral)  Resp 20  Ht 5\' 4"  (1.626 m)  Wt 215 lb (97.523 kg)  BMI 36.90 kg/m2  SpO2 100%  LMP 03/13/2011  Physical Exam  Nursing note and vitals reviewed. Constitutional: She is oriented to person, place, and time. She appears well-developed and well-nourished. No distress.  HENT:  Head: Normocephalic and atraumatic.  Neck: Normal range of motion. Neck supple.  Cardiovascular: Normal rate and regular rhythm.  Exam reveals no gallop and no friction rub.   No murmur heard. Pulmonary/Chest: Effort normal and breath sounds normal. No respiratory distress. She has no wheezes.  Abdominal: Soft. Bowel sounds are normal. She exhibits no distension. There is tenderness.       There is ttp in the periumbilical region.  No rebound or guarding is noted.  Genitourinary: Vagina normal and uterus normal. No vaginal discharge found.       Bilateral adnexal tenderness but no masses palpable.  There is cmt.    Musculoskeletal: Normal range of motion.  Neurological: She is alert and oriented to person, place, and time.  Skin: Skin is warm and dry. She is not diaphoretic.    ED Course  Procedures (including critical care time)   Labs Reviewed  CBC  DIFFERENTIAL  COMPREHENSIVE METABOLIC PANEL  URINALYSIS, ROUTINE W REFLEX MICROSCOPIC  PREGNANCY, URINE   No results found.   No diagnosis found.    MDM  The patient received no relief from toradol, as expected.  She is hyperventilating and carrying on quite dramatically.  As there is blood in her urine and she is having the degree of discomfort she is reporting, I will obtain a CT scan of the abdomen and pelvis to rule out an obstructing stone or other pathology.  Shortly after ordering this, I received a call from the radiologist who expressed a concern over performing this test due to repeat ct's in the past.    She has a history of chronic abd pain  and has been here multiple times for the past several years.  She has had numerous CT scans.  For some reason, there is no primary physician involved in her care.  I counseled her on the importance of having a primary care doctor to both manage her pain and make the appropriate referrals.    The pelvic exam appears normal, the wet prep show clue cells.  Will treat with 10 percocet, flagyl.  She needs pcp follow up.        Geoffery Lyons, MD 05/01/11 [phone]

## 2011-05-02 LAB — GC/CHLAMYDIA PROBE AMP, GENITAL: GC Probe Amp, Genital: NEGATIVE

## 2011-05-29 ENCOUNTER — Emergency Department (HOSPITAL_COMMUNITY)
Admission: EM | Admit: 2011-05-29 | Discharge: 2011-05-29 | Disposition: A | Discharge status: Home / Self Care | Payer: Medicaid Other | Attending: Emergency Medicine | Admitting: Emergency Medicine

## 2011-05-29 ENCOUNTER — Encounter (HOSPITAL_COMMUNITY): Payer: Self-pay

## 2011-05-29 DIAGNOSIS — N898 Other specified noninflammatory disorders of vagina: Secondary | ICD-10-CM | POA: Insufficient documentation

## 2011-05-29 DIAGNOSIS — Z87442 Personal history of urinary calculi: Secondary | ICD-10-CM | POA: Insufficient documentation

## 2011-05-29 DIAGNOSIS — N939 Abnormal uterine and vaginal bleeding, unspecified: Secondary | ICD-10-CM

## 2011-05-29 DIAGNOSIS — F17200 Nicotine dependence, unspecified, uncomplicated: Secondary | ICD-10-CM | POA: Insufficient documentation

## 2011-05-29 DIAGNOSIS — I10 Essential (primary) hypertension: Secondary | ICD-10-CM

## 2011-05-29 DIAGNOSIS — N949 Unspecified condition associated with female genital organs and menstrual cycle: Secondary | ICD-10-CM | POA: Insufficient documentation

## 2011-05-29 LAB — POCT PREGNANCY, URINE: Preg Test, Ur: NEGATIVE

## 2011-05-29 MED ORDER — HYDROMORPHONE HCL PF 1 MG/ML IJ SOLN
0.5000 mg | Freq: Once | INTRAMUSCULAR | Status: AC
  Administered 2011-05-29: 0.5 mg via INTRAVENOUS
  Filled 2011-05-29: qty 1

## 2011-05-29 MED ORDER — ONDANSETRON HCL 4 MG/2ML IJ SOLN: 4.0000 mg | Freq: Once | INTRAMUSCULAR | Status: DC

## 2011-05-29 NOTE — ED Notes (Signed)
Pt states lower abdominal pain which began at 0400. Also reports vomiting x 6 since then. Pt described pain as a "ripping sensation". Pt able to ambulate to restroom with assistance without difficulty.

## 2011-05-29 NOTE — Discharge Instructions (Signed)

## 2011-05-29 NOTE — ED Notes (Signed)
Pt was informed by EMD that she has had 20+ CT's which may make her a hight risk for cancer. Informed pt to use caution with abdominal pain and future CT's. Records from [place] and [place] did not show any abnormality. Pt continued to be tearful on arrival. EMD educated pt on importance of finding a PMD to follow her medical problems. Pt referred to Dr. Felecia Shelling.

## 2011-05-29 NOTE — ED Notes (Signed)
Complain of pelvic pain. States she has was told by her doctor two weeks ago she has a large cyst on her right overy

## 2011-05-29 NOTE — ED Notes (Signed)
Pt stated she vomited. Small amount of saliva in basin. NAD.

## 2011-05-29 NOTE — ED Provider Notes (Signed)
History  This chart was scribed for [name] Gaskins, MD by Cherlynn Perches. The patient was seen in room APA14/APA14. Patient's care was started at 0825.  CSN: [id number]  Arrival date & time 05/29/11  0825   First MD Initiated Contact with Patient 05/29/11 [phone]      Chief Complaint  Patient presents with  . Pelvic Pain     HPI  [name] is a 26 y.o. female with a h/o ovarian cysts who presents to the Emergency Department complaining of 4 hours of sudden onset, unchanging, moderate to severe, sharp pelvic pain with associated vaginal bleeding. Pt reports that she visited a diagnostic center in [place], [place] two weeks ago and was told that she has a large cyst on her right ovary. Pt states that she had similar pain several years ago when a previous cyst ruptured. Pt reports no modifying factors. Pt denies back pain, fever, nausea, vomiting, chills, HA, and dizziness. Pt also has a h/o kidney stones and UTI. Pt is a current everyday smoker and denies alcohol use. Pt's PCP is Dr. Loney Hering in [place], [place].    Past Medical History  Diagnosis Date  . Ovarian cyst   . Kidney stones   . UTI (lower urinary tract infection)   . Calcium oxalate renal stones     Past Surgical History  Procedure Date  . Ovarian cyst removal   . Lithotripsy   . Laparoscopic tubal ligation   . Cholecystectomy   . Appendectomy     No family history on file.  History  Substance Use Topics  . Smoking status: Current Everyday Smoker -- 0.5 packs/day  . Smokeless tobacco: Not on file  . Alcohol Use: No    OB History    Grav Para Term Preterm Abortions TAB SAB Ect Mult Living                  Review of Systems A complete 10 system review of systems was obtained and all systems are negative except as noted in the HPI and PMH.   Allergies  Iohexol; Zofran; Morphine and related; and Toradol  Home Medications   Current Outpatient Rx  Name Route Sig Dispense Refill  . DIMENHYDRINATE 50 MG PO TABS  Oral Take 50 mg by mouth every 8 (eight) hours as needed. For nausea    . TOPIRAMATE 50 MG PO TABS Oral Take 50 mg by mouth 2 (two) times daily.        BP 156/111  Pulse 88  Temp 98.1 F (36.7 C)  Resp 22  SpO2 98%  LMP 03/13/2011  Physical Exam CONSTITUTIONAL: Well developed/well nourished, anxious appearing HEAD AND FACE: Normocephalic/atraumatic EYES: EOMI/PERRL ENMT: Mucous membranes moist NECK: supple no meningeal signs SPINE:entire spine nontender CV: S1/S2 noted, no murmurs/rubs/gallops noted LUNGS: Lungs are clear to auscultation bilaterally, no apparent distress ABDOMEN: soft, nontender, no rebound or guarding GU - no CVA tenderness. Blood noted in vaginal vault.  Os closed.  No cmt.  No uterine tenderness.  Right adnexal tenderness noted.  Left adnexa nontender, chaperone present NEURO: Pt is awake/alert, moves all extremitiesx4 EXTREMITIES: pulses normal, full ROM SKIN: warm, color normal PSYCH: Anxious  ED Course  Procedures   DIAGNOSTIC STUDIES: Oxygen Saturation is 98% on room air, normal by my interpretation.    COORDINATION OF CARE: 8:45AM-Patient informed of current plan for treatment and evaluation and agrees with plan at this time. Will perform pelvic exam. Pt with long h/o abd pain with multiple  imaging modalities.  Initial exam reveals soft abdomen 9:09 AM Pt reports [place] earlier this month in [place] and then another gyn [place] in [place] this month.  Will attempt to call for those records.   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  Both ovaries normal with perfusion  Pt stable at this time My suspicion for acute abd/gyn process is low I doubt ovarian torsion/TOA given recent normal [place] at [facility] I advised pt that she has had close to 20 CT scans per records from [place], [place] and [place].  I advised this places her at higher risk of cancer due to radiation exposure.  I advised f/u with gyn and her PCP  for pain management Also needs better control of her BP.    MDM  Nursing notes reviewed and considered in documentation Previous records reviewed and considered from [facility] from 05/23/11 All labs/vitals reviewed and considered Narcotic database reviewed     I personally performed the services described in this documentation, which was scribed in my presence. The recorded information has been reviewed and considered.      [name] Gaskins, MD 05/29/11 1027

## 2011-07-01 ENCOUNTER — Encounter (HOSPITAL_COMMUNITY): Payer: Self-pay | Admitting: Emergency Medicine

## 2011-07-01 ENCOUNTER — Emergency Department (HOSPITAL_COMMUNITY): Payer: Medicaid Other

## 2011-07-01 ENCOUNTER — Emergency Department (HOSPITAL_COMMUNITY)
Admission: EM | Admit: 2011-07-01 | Discharge: 2011-07-01 | Disposition: A | Discharge status: Home / Self Care | Payer: Medicaid Other | Attending: Emergency Medicine | Admitting: Emergency Medicine

## 2011-07-01 DIAGNOSIS — N739 Female pelvic inflammatory disease, unspecified: Secondary | ICD-10-CM | POA: Insufficient documentation

## 2011-07-01 DIAGNOSIS — R112 Nausea with vomiting, unspecified: Secondary | ICD-10-CM | POA: Insufficient documentation

## 2011-07-01 DIAGNOSIS — J45909 Unspecified asthma, uncomplicated: Secondary | ICD-10-CM | POA: Insufficient documentation

## 2011-07-01 LAB — BASIC METABOLIC PANEL
CO2: 23 mEq/L (ref 19–32)
Calcium: 9.4 mg/dL (ref 8.4–10.5)
GFR calc non Af Amer: >90 mL/min (ref >90)
Potassium: 3.5 mEq/L (ref 3.5–5.1)
Sodium: 136 mEq/L (ref 135–145)

## 2011-07-01 LAB — URINE MICROSCOPIC-ADD ON

## 2011-07-01 LAB — URINALYSIS, ROUTINE W REFLEX MICROSCOPIC
Glucose, UA: NEGATIVE mg/dL
Protein, ur: NEGATIVE mg/dL
Specific Gravity, Urine: 1.005 (ref 1.005–1.030)
pH: 7 (ref 5.0–8.0)

## 2011-07-01 LAB — CBC
MCV: 91 fL (ref 78.0–100.0)
Platelets: 166 10*3/uL (ref 150–400)
RBC: 4.42 MIL/uL (ref 3.87–5.11)
RDW: 13.1 % (ref 11.5–15.5)
WBC: 4.4 10*3/uL (ref 4.0–10.5)

## 2011-07-01 LAB — WET PREP, GENITAL
Trich, Wet Prep: NONE SEEN
Yeast Wet Prep HPF POC: NONE SEEN

## 2011-07-01 LAB — DIFFERENTIAL
Basophils Absolute: 0 10*3/uL (ref 0.0–0.1)
Eosinophils Relative: 1 % (ref 0–5)
Lymphocytes Relative: 51 % — ABNORMAL HIGH (ref 12–46)
Lymphs Abs: 2.2 10*3/uL (ref 0.7–4.0)
Neutrophils Relative %: 42 % — ABNORMAL LOW (ref 43–77)

## 2011-07-01 LAB — PREGNANCY, URINE: Preg Test, Ur: NEGATIVE

## 2011-07-01 MED ORDER — DOXYCYCLINE HYCLATE 100 MG PO CAPS: 100.0000 mg | ORAL_CAPSULE | Freq: Two times a day (BID) | ORAL | Status: AC

## 2011-07-01 MED ORDER — HYDROMORPHONE HCL PF 1 MG/ML IJ SOLN
1.0000 mg | Freq: Once | INTRAMUSCULAR | Status: AC
  Administered 2011-07-01: 1 mg via INTRAVENOUS
  Filled 2011-07-01: qty 1

## 2011-07-01 MED ORDER — OXYCODONE-ACETAMINOPHEN 5-325 MG PO TABS: 1.0000 | ORAL_TABLET | Freq: Four times a day (QID) | ORAL | Status: AC | PRN

## 2011-07-01 MED ORDER — SODIUM CHLORIDE 0.9 % IV BOLUS (SEPSIS)
1000.0000 mL | Freq: Once | INTRAVENOUS | Status: AC
  Administered 2011-07-01: 1000 mL via INTRAVENOUS

## 2011-07-01 MED ORDER — PROMETHAZINE HCL 25 MG/ML IJ SOLN
12.5000 mg | Freq: Once | INTRAMUSCULAR | Status: AC
  Administered 2011-07-01: 25 mg via INTRAVENOUS
  Filled 2011-07-01: qty 1

## 2011-07-01 MED ORDER — METRONIDAZOLE 500 MG PO TABS: 500.0000 mg | ORAL_TABLET | Freq: Two times a day (BID) | ORAL | Status: AC

## 2011-07-01 MED ORDER — ONDANSETRON HCL 8 MG PO TABS: 8.0000 mg | ORAL_TABLET | ORAL | Status: AC | PRN

## 2011-07-01 MED ORDER — CEFTRIAXONE SODIUM 500 MG IJ SOLR
250.0000 mg | Freq: Once | INTRAMUSCULAR | Status: AC
  Administered 2011-07-01: 250 mg via INTRAVENOUS
  Filled 2011-07-01: qty 250

## 2011-07-01 MED ORDER — TRAMADOL HCL 50 MG PO TABS
100.0000 mg | ORAL_TABLET | Freq: Once | ORAL | Status: AC
  Administered 2011-07-01: 100 mg via ORAL
  Filled 2011-07-01: qty 2

## 2011-07-01 MED ORDER — PROMETHAZINE HCL 25 MG/ML IJ SOLN
12.5000 mg | Freq: Once | INTRAMUSCULAR | Status: AC
  Administered 2011-07-01: 12.5 mg via INTRAVENOUS
  Filled 2011-07-01: qty 1

## 2011-07-01 MED ORDER — PANTOPRAZOLE SODIUM 40 MG IV SOLR
40.0000 mg | Freq: Once | INTRAVENOUS | Status: AC
  Administered 2011-07-01: 40 mg via INTRAVENOUS
  Filled 2011-07-01: qty 40

## 2011-07-01 NOTE — ED Provider Notes (Addendum)
History   This chart was scribed for Donnetta Hutching, MD by Clarita Crane. The patient was seen in room APA09/APA09. Patient's care was started at 0940.    CSN: [id number]  Arrival date & time 07/01/11  0940   First MD Initiated Contact with Patient 07/01/11 [phone]      Chief Complaint  Patient presents with  . Hematuria  . Abdominal Pain    (Consider location/radiation/quality/duration/timing/severity/associated sxs/prior treatment) HPI [name] is a 26 y.o. female who presents to the Emergency Department complaining of sudden onset of constant moderate to severe suprapubic abdominal pain with associated hematuria, nausea and vomiting that began 7.5 hours ago and has been persistent since. Patient states current pain is not similar to that previously experienced with kidney stones and UTI. Denies SOB, chest pain, fever, chills. Patient with h/o kidney stones, ovarian cyst, UTI, asthma, cholecystectomy and appendectomy.   Past Medical History  Diagnosis Date  . Ovarian cyst   . Kidney stones   . UTI (lower urinary tract infection)   . Calcium oxalate renal stones   . Asthma     Past Surgical History  Procedure Date  . Ovarian cyst removal   . Lithotripsy   . Laparoscopic tubal ligation   . Cholecystectomy   . Appendectomy     No family history on file.  History  Substance Use Topics  . Smoking status: Current Everyday Smoker -- 0.5 packs/day  . Smokeless tobacco: Not on file  . Alcohol Use: No    OB History    Grav Para Term Preterm Abortions TAB SAB Ect Mult Living                  Review of Systems A complete 10 system review of systems was obtained and all systems are negative except as noted in the HPI and PMH.   Allergies  Iohexol; Zofran; Ketorolac tromethamine; and Morphine and related  Home Medications   Current Outpatient Rx  Name Route Sig Dispense Refill  . DIMENHYDRINATE 50 MG PO TABS Oral Take 50 mg by mouth every 8 (eight) hours as needed.  For nausea    . IBUPROFEN 200 MG PO TABS Oral Take 800 mg by mouth every 6 (six) hours as needed. Pain    . TOPIRAMATE 50 MG PO TABS Oral Take 50 mg by mouth 2 (two) times daily.        BP 167/111  Pulse 73  Temp 99 F (37.2 C)  Resp 22  Ht 5\' 4"  (1.626 m)  Wt 210 lb (95.255 kg)  BMI 36.05 kg/m2  SpO2 100%  LMP 06/09/2011  Physical Exam  Nursing note and vitals reviewed. Constitutional: She is oriented to person, place, and time. She appears well-developed and well-nourished. No distress.  HENT:  Head: Normocephalic and atraumatic.  Eyes: EOM are normal. Pupils are equal, round, and reactive to light.  Neck: Neck supple. No tracheal deviation present.  Cardiovascular: Normal rate and regular rhythm.   No murmur heard. Pulmonary/Chest: Effort normal. No respiratory distress. She has no wheezes. She has no rales.  Abdominal: Soft. She exhibits no distension. There is tenderness in the right lower quadrant and suprapubic area.  Genitourinary:       External genitalia normal. No obvious cervical discharge. Positive cervical motion tenderness. Adnexa within normal limits  Musculoskeletal: Normal range of motion. She exhibits no edema.  Neurological: She is alert and oriented to person, place, and time. No sensory deficit.  Skin: Skin is warm  and dry.  Psychiatric: She has a normal mood and affect. Her behavior is normal.    ED Course  Procedures (including critical care time)  DIAGNOSTIC STUDIES: Oxygen Saturation is 97% on room air, normal by my interpretation.    COORDINATION OF CARE: 10:03AM- Patient informed of current plan for treatment and evaluation and agrees with plan at this time. Will administer pain medication and antiemetics.     Labs Reviewed  URINALYSIS, ROUTINE W REFLEX MICROSCOPIC - Abnormal; Notable for the following:    Color, Urine ORANGE (*) BIOCHEMICALS MAY BE AFFECTED BY COLOR   APPearance HAZY (*)    Hgb urine dipstick LARGE (*)    Leukocytes, UA  TRACE (*)    All other components within normal limits  DIFFERENTIAL - Abnormal; Notable for the following:    Neutrophils Relative 42 (*)    Lymphocytes Relative 51 (*)    All other components within normal limits  BASIC METABOLIC PANEL - Abnormal; Notable for the following:    Glucose, Bld 109 (*)    All other components within normal limits  WET PREP, GENITAL - Abnormal; Notable for the following:    Clue Cells Wet Prep HPF POC FEW (*)    WBC, Wet Prep HPF POC MODERATE (*)    All other components within normal limits  PREGNANCY, URINE  URINE MICROSCOPIC-ADD ON  CBC  GC/CHLAMYDIA PROBE AMP, GENITAL   Ct Abdomen Pelvis Wo Contrast  07/01/2011  *RADIOLOGY REPORT*  Clinical Data: Hematuria with nausea, vomiting and suprapubic abdominal pain.  CT ABDOMEN AND PELVIS WITHOUT CONTRAST  Technique:  Multidetector CT imaging of the abdomen and pelvis was performed following the standard protocol without intravenous contrast.  Comparison: Prior examination 06/02/2011.  Findings: Numerous bilateral renal calculi have not substantially changed.  There is no evidence of hydronephrosis, perinephric soft tissue stranding or ureteral calculus.  Bilateral pelvic calcifications are unchanged, attributed to phleboliths.  There is no evidence of bladder calculus.  Both kidneys otherwise appear normal.  There is mild hepatic steatosis.  There is no biliary dilatation status post cholecystectomy.  The spleen, pancreas and adrenal glands appear unremarkable.  The lung bases are clear.  There is no pleural effusion.  Broad-based umbilical hernia appears unchanged.  A small radial density medial to the cecum on coronal image 39 is unchanged, probably related to prior appendectomy.  There is no surrounding inflammatory change.  The uterus and ovaries appear normal.  No acute osseous findings or typical pars defects are demonstrated. There is an atypical defect involving the inferior right L2 articulating facet which  appears unchanged.  IMPRESSION:  1.  Unchanged bilateral renal calculi.  No evidence of ureteral calculus or hydronephrosis. 2.  No acute abdominal findings demonstrated.  Original Report Authenticated By: Gerrianne Scale, M.D.     No diagnosis found. CRITICAL CARE Performed by: Donnetta Hutching   Total critical care time: 30  Critical care time was exclusive of separately billable procedures and treating other patients.  Critical care was necessary to treat or prevent imminent or life-threatening deterioration.  Critical care was time spent personally by me on the following activities: development of treatment plan with patient and/or surrogate as well as nursing, discussions with consultants, evaluation of patient's response to treatment, examination of patient, obtaining history from patient or surrogate, ordering and performing treatments and interventions, ordering and review of laboratory studies, ordering and review of radiographic studies, pulse oximetry and re-evaluation of patient's condition.   MDM  Pelvic exam shows  cervical motion tenderness. Will treat for PID. Vital signs are stable. She is afebrile. No pus in cervix      I personally performed the services described in this documentation, which was scribed in my presence. The recorded information has been reviewed and considered.    Donnetta Hutching, MD 07/01/11 1616  Donnetta Hutching, MD 07/01/11 [phone]

## 2011-07-01 NOTE — Discharge Instructions (Signed)
Pelvic Inflammatory Disease Pelvic Inflammatory Disease (PID) is an infection in some or all of your female organs. This includes the womb (uterus), ovaries, fallopian tubes and tissues in the pelvis. PID is a common cause of sudden onset (acute) lower abdominal (pelvic) pain. PID can be treated, but it is a serious infection. It may take weeks before you are completely well. In some cases, hospitalization is needed for surgery or to administer medications to kill germs (antibiotics) through your veins (intravenously). CAUSES   It may be caused by germs that are spread during sexual contact.   PID can also occur following:   The birth of a baby.   A miscarriage.   An abortion.   Major surgery of the pelvis.   Use of an IUD.   Sexual assault.  SYMPTOMS   Abdominal or pelvic pain.   Fever.   Chills.   Abnormal vaginal discharge.  DIAGNOSIS  Your caregiver will choose some of these methods to make a diagnosis:  A physical exam and history.   Blood tests.   Cultures of the vagina and cervix.   X-rays or ultrasound.   A procedure to look inside the pelvis (laparoscopy).  TREATMENT   Use of antibiotics by mouth or intravenously.   Treatment of sexual partners when the infection is an sexually transmitted disease (STD).   Hospitalization and surgery may be needed.  RISKS AND COMPLICATIONS   PID can cause women to become unable to have children (sterile) if left untreated or if partially treated. That is why it is important to finish all medications given to you.   Sterility or future tubal (ectopic) pregnancies can occur in fully treated individuals. This is why it is so important to follow your prescribed treatment.   It can cause longstanding (chronic) pelvic pain after frequent infections.   Painful intercourse.   Pelvic abscesses.   In rare cases, surgery or a hysterectomy may be needed.   If this is a sexually transmitted infection (STI), you are also at  risk for any other STD including AIDSor human papillomavirus (HPV).  HOME CARE INSTRUCTIONS   Finish all medication as prescribed. Incomplete treatment will put you at risk for sterility and tubal pregnancy.   Only take over-the-counter or prescription medicines for pain, discomfort, or fever as directed by your caregiver.   Do not have sex until treatment is completed or as directed by your caregiver. If PID is confirmed, your recent sexual contacts will need treatment.   Keep your follow-up appointments.  SEEK MEDICAL CARE IF:   You have increased or abnormal vaginal discharge.   You need prescription medication for your pain.   Your partner has an STD.   You are vomiting.   You cannot take your medications.  SEEK IMMEDIATE MEDICAL CARE IF:   You have a fever.   You develop increased abdominal or pelvic pain.   You develop chills.   You have pain when you urinate.   You are not better after 72 hours following treatment.  Document Released: 01/26/2005 Document Revised: 01/15/2011 Document Reviewed: 10/09/2006 [facility] Patient Information 2012 [place], [place].  CT scan showed no acute findings. We'll treat you for pelvic inflammatory disease. This includes 2 antibiotics for 2 weeks, medication for pain and nausea

## 2011-07-01 NOTE — ED Notes (Signed)
Pt c/o lower abd pain/n/v/hematuria since 0230 this am.

## 2011-07-01 NOTE — ED Notes (Signed)
Patient is was walking to restroom and states she is having bad pain. RN [employer] notified.

## 2011-07-01 NOTE — ED Notes (Signed)
MD at bedside. 

## 2011-07-02 LAB — GC/CHLAMYDIA PROBE AMP, GENITAL: GC Probe Amp, Genital: NEGATIVE

## 2011-07-20 ENCOUNTER — Encounter (HOSPITAL_COMMUNITY): Payer: Self-pay | Admitting: Emergency Medicine

## 2011-07-20 ENCOUNTER — Inpatient Hospital Stay (HOSPITAL_COMMUNITY)
Admission: EM | Admit: 2011-07-20 | Discharge: 2011-07-25 | DRG: 690 | Disposition: A | Discharge status: Home / Self Care | Payer: Medicaid Other | Attending: Internal Medicine | Admitting: Internal Medicine

## 2011-07-20 ENCOUNTER — Emergency Department (HOSPITAL_COMMUNITY): Payer: Medicaid Other

## 2011-07-20 DIAGNOSIS — N10 Acute tubulo-interstitial nephritis: Secondary | ICD-10-CM

## 2011-07-20 DIAGNOSIS — R799 Abnormal finding of blood chemistry, unspecified: Secondary | ICD-10-CM | POA: Diagnosis present

## 2011-07-20 DIAGNOSIS — J45909 Unspecified asthma, uncomplicated: Secondary | ICD-10-CM | POA: Diagnosis present

## 2011-07-20 DIAGNOSIS — Z87442 Personal history of urinary calculi: Secondary | ICD-10-CM

## 2011-07-20 DIAGNOSIS — N200 Calculus of kidney: Secondary | ICD-10-CM

## 2011-07-20 DIAGNOSIS — R7989 Other specified abnormal findings of blood chemistry: Secondary | ICD-10-CM | POA: Diagnosis present

## 2011-07-20 DIAGNOSIS — R944 Abnormal results of kidney function studies: Secondary | ICD-10-CM

## 2011-07-20 DIAGNOSIS — F17200 Nicotine dependence, unspecified, uncomplicated: Secondary | ICD-10-CM | POA: Diagnosis present

## 2011-07-20 DIAGNOSIS — R110 Nausea: Secondary | ICD-10-CM | POA: Diagnosis present

## 2011-07-20 DIAGNOSIS — R109 Unspecified abdominal pain: Secondary | ICD-10-CM | POA: Diagnosis present

## 2011-07-20 DIAGNOSIS — E876 Hypokalemia: Secondary | ICD-10-CM | POA: Diagnosis present

## 2011-07-20 LAB — DIFFERENTIAL
Basophils Relative: 0 % (ref 0–1)
Eosinophils Absolute: 0 10*3/uL (ref 0.0–0.7)
Lymphs Abs: 0.8 10*3/uL (ref 0.7–4.0)
Monocytes Absolute: 1 10*3/uL (ref 0.1–1.0)
Neutro Abs: 18.4 10*3/uL — ABNORMAL HIGH (ref 1.7–7.7)

## 2011-07-20 LAB — URINALYSIS, ROUTINE W REFLEX MICROSCOPIC
Protein, ur: 100 mg/dL — AB
Specific Gravity, Urine: >1.03 — ABNORMAL HIGH (ref 1.005–1.030)
Urobilinogen, UA: 1 mg/dL (ref 0.0–1.0)

## 2011-07-20 LAB — URINE MICROSCOPIC-ADD ON

## 2011-07-20 LAB — CBC
HCT: 42.4 % (ref 36.0–46.0)
Hemoglobin: 13.8 g/dL (ref 12.0–15.0)
MCH: 30 pg (ref 26.0–34.0)
MCHC: 32.5 g/dL (ref 30.0–36.0)
RDW: 13.5 % (ref 11.5–15.5)

## 2011-07-20 LAB — BASIC METABOLIC PANEL
BUN: 18 mg/dL (ref 6–23)
Calcium: 9.9 mg/dL (ref 8.4–10.5)
Creatinine, Ser: 1.37 mg/dL — ABNORMAL HIGH (ref 0.50–1.10)
GFR calc non Af Amer: 52 mL/min — ABNORMAL LOW (ref >90)
Glucose, Bld: 145 mg/dL — ABNORMAL HIGH (ref 70–99)

## 2011-07-20 LAB — PREGNANCY, URINE: Preg Test, Ur: NEGATIVE

## 2011-07-20 MED ORDER — SODIUM CHLORIDE 0.9 % IV BOLUS (SEPSIS)
500.0000 mL | Freq: Once | INTRAVENOUS | Status: AC
  Administered 2011-07-20: 500 mL via INTRAVENOUS

## 2011-07-20 MED ORDER — HYDROMORPHONE HCL PF 1 MG/ML IJ SOLN
1.0000 mg | Freq: Once | INTRAMUSCULAR | Status: AC
  Administered 2011-07-20: 1 mg via INTRAVENOUS
  Filled 2011-07-20: qty 1

## 2011-07-20 MED ORDER — ALBUTEROL SULFATE (5 MG/ML) 0.5% IN NEBU: 2.5000 mg | INHALATION_SOLUTION | RESPIRATORY_TRACT | Status: DC | PRN

## 2011-07-20 MED ORDER — HEPARIN SODIUM (PORCINE) 5000 UNIT/ML IJ SOLN
5000.0000 [IU] | Freq: Three times a day (TID) | INTRAMUSCULAR | Status: DC
  Administered 2011-07-21 – 2011-07-25 (×12): 5000 [IU] via SUBCUTANEOUS
  Filled 2011-07-20 (×17): qty 1

## 2011-07-20 MED ORDER — ACETAMINOPHEN 650 MG RE SUPP: 650.0000 mg | Freq: Four times a day (QID) | RECTAL | Status: DC | PRN

## 2011-07-20 MED ORDER — DEXTROSE 5 % IV SOLN
1.0000 g | INTRAVENOUS | Status: DC
  Administered 2011-07-21 – 2011-07-22 (×2): 1 g via INTRAVENOUS
  Filled 2011-07-20 (×3): qty 10

## 2011-07-20 MED ORDER — DEXTROSE 5 % IV SOLN
1.0000 g | Freq: Once | INTRAVENOUS | Status: AC
  Administered 2011-07-20: 1 g via INTRAVENOUS
  Filled 2011-07-20: qty 10

## 2011-07-20 MED ORDER — ACETAMINOPHEN 500 MG PO TABS
1000.0000 mg | ORAL_TABLET | Freq: Once | ORAL | Status: AC
  Administered 2011-07-20: 1000 mg via ORAL
  Filled 2011-07-20: qty 2

## 2011-07-20 MED ORDER — ONDANSETRON HCL 4 MG/2ML IJ SOLN
4.0000 mg | Freq: Four times a day (QID) | INTRAMUSCULAR | Status: DC | PRN
  Administered 2011-07-21: 4 mg via INTRAVENOUS
  Filled 2011-07-20: qty 2

## 2011-07-20 MED ORDER — ACETAMINOPHEN 325 MG PO TABS
650.0000 mg | ORAL_TABLET | Freq: Four times a day (QID) | ORAL | Status: DC | PRN
Start: 2011-07-20 — End: 2011-07-25
  Administered 2011-07-21 – 2011-07-24 (×5): 650 mg via ORAL
  Filled 2011-07-20 (×5): qty 2

## 2011-07-20 MED ORDER — PROMETHAZINE HCL 25 MG/ML IJ SOLN
25.0000 mg | Freq: Once | INTRAMUSCULAR | Status: AC
  Administered 2011-07-20: 25 mg via INTRAVENOUS
  Filled 2011-07-20: qty 1

## 2011-07-20 MED ORDER — HYDROMORPHONE HCL PF 1 MG/ML IJ SOLN
1.0000 mg | INTRAMUSCULAR | Status: DC | PRN
  Administered 2011-07-21 (×2): 1 mg via INTRAVENOUS
  Filled 2011-07-20 (×2): qty 1

## 2011-07-20 MED ORDER — SODIUM CHLORIDE 0.9 % IV SOLN
INTRAVENOUS | Status: DC
  Administered 2011-07-20: 23:00:00 via INTRAVENOUS

## 2011-07-20 MED ORDER — ONDANSETRON HCL 4 MG PO TABS: 4.0000 mg | ORAL_TABLET | Freq: Four times a day (QID) | ORAL | Status: DC | PRN

## 2011-07-20 MED ORDER — HYDROCODONE-ACETAMINOPHEN 5-325 MG PO TABS
1.0000 | ORAL_TABLET | ORAL | Status: DC | PRN
  Administered 2011-07-21: 2 via ORAL
  Filled 2011-07-20: qty 2

## 2011-07-20 NOTE — H&P (Signed)
[name] is an 26 y.o. female.    PCP: Dr. Rennis Chris in [facility] Her urologist is Dr. Jerre Simon  Chief Complaint: Abdominal pain, nausea, vomiting  HPI: This is a 26 year old, African American female, with a past medical history of nephrolithiasis. She was apparently in the hospital in [place], [place] and had a stent placed into her right ureter about 2 weeks ago. She was hospitalized at [facility] in [facility] about 3-4 weeks ago. She saw Dr. Jerre Simon a few weeks ago. She tells me that she was in her usual state of health about 2 days ago, when she started having abdominal pain in the right side. Described as a sharp pain. It is located in the flank and radiated to the right groin. It  was 10 out of 10 in intensity. It was associated with multiple episodes of nausea and vomiting. She also had fever, but denied any chills. Does give history of dysuria, and blood in the urine. Denies any diarrhea. She's not on any antibiotics recently, but was on one about 3 weeks ago. Denies being on chronic pain medications. There is no precipitating, aggravating or relieving factors for the pain. History was limited because of patient was in significant pain and was unable to answer all my questions   Home Medications: Prior to Admission medications   Medication Sig Start Date End Date Taking? Authorizing Provider  dimenhyDRINATE (DRAMAMINE) 50 MG tablet Take 50 mg by mouth every 8 (eight) hours as needed. For nausea    Historical Provider, MD  ibuprofen (ADVIL,MOTRIN) 200 MG tablet Take 800 mg by mouth every 6 (six) hours as needed. Pain    Historical Provider, MD  topiramate (TOPAMAX) 50 MG tablet Take 50 mg by mouth 2 (two) times daily.      Historical Provider, MD    Allergies:  Allergies  Allergen Reactions  . Iohexol Hives    ALLERGIC TO IVP DYE, HIVE, PT NEEDS PRE MEDS   . Zofran Nausea And Vomiting  . Ketorolac Tromethamine Rash  . Morphine And Related Rash    Past  Medical History: Past Medical History  Diagnosis Date  . Ovarian cyst   . Kidney stones   . UTI (lower urinary tract infection)   . Calcium oxalate renal stones   . Asthma     Past Surgical History  Procedure Date  . Ovarian cyst removal   . Lithotripsy   . Laparoscopic tubal ligation   . Cholecystectomy   . Appendectomy     Social History:  reports that she has been smoking.  She does not have any smokeless tobacco history on file. She reports that she does not drink alcohol or use illicit drugs.  Family History: There is history of hypertension in the family.  Review of Systems -  unobtainable from patient due to pain  Physical Examination Blood pressure 107/65, pulse 116, temperature 101.2 F (38.4 C), temperature source Oral, resp. rate 22, height 5\' 4"  (1.626 m), weight 97.523 kg (215 lb), last menstrual period 06/09/2011, SpO2 98.00%.  General appearance: She is somewhat somnolent due to pain medications, but easily arousable. Appears to be in some discomfort, but not in any distress. Head: Normocephalic, without obvious abnormality, atraumatic Eyes: conjunctivae/corneas clear. PERRL, EOM's intact.  Throat: She has dry. Mucous membranes. Neck: no adenopathy, no carotid bruit, no JVD, supple, symmetrical, trachea midline and thyroid not enlarged, symmetric, no tenderness/mass/nodules Resp: clear to auscultation bilaterally Cardio: regular rate and rhythm, S1, S2 normal, no  murmur, click, rub or gallop GI: She is tender on the right side and the flank area , as well as in the right lower quadrant. There is no rebound, rigidity, or guarding. No masses, organomegaly is appreciated. Extremities: extremities normal, atraumatic, no cyanosis or edema Pulses: 2+ and symmetric Skin: Skin color, texture, turgor normal. No rashes or lesions Lymph nodes: Cervical, supraclavicular, and axillary nodes normal. Neurologic: Somewhat somnolent. But does not have any focal  deficits.  Laboratory Data: Results for orders placed during the hospital encounter of 07/20/11 (from the past 48 hour(s))  CBC     Status: Abnormal   Collection Time   07/20/11  8:51 PM      Component Value Range Comment   WBC 20.2 (*) 4.0 - 10.5 (K/uL)    RBC 4.60  3.87 - 5.11 (MIL/uL)    Hemoglobin 13.8  12.0 - 15.0 (g/dL)    HCT 16.1  09.6 - 04.5 (%)    MCV 92.2  78.0 - 100.0 (fL)    MCH 30.0  26.0 - 34.0 (pg)    MCHC 32.5  30.0 - 36.0 (g/dL)    RDW 40.9  81.1 - 91.4 (%)    Platelets 205  150 - 400 (K/uL)   DIFFERENTIAL     Status: Abnormal   Collection Time   07/20/11  8:51 PM      Component Value Range Comment   Neutrophils Relative 91 (*) 43 - 77 (%)    Lymphocytes Relative 4 (*) 12 - 46 (%)    Monocytes Relative 5  3 - 12 (%)    Eosinophils Relative 0  0 - 5 (%)    Basophils Relative 0  0 - 1 (%)    Neutro Abs 18.4 (*) 1.7 - 7.7 (K/uL)    Lymphs Abs 0.8  0.7 - 4.0 (K/uL)    Monocytes Absolute 1.0  0.1 - 1.0 (K/uL)    Eosinophils Absolute 0.0  0.0 - 0.7 (K/uL)    Basophils Absolute 0.0  0.0 - 0.1 (K/uL)    WBC Morphology TOXIC GRANULATION     BASIC METABOLIC PANEL     Status: Abnormal   Collection Time   07/20/11  8:51 PM      Component Value Range Comment   Sodium 136  135 - 145 (mEq/L)    Potassium 3.6  3.5 - 5.1 (mEq/L)    Chloride 98  96 - 112 (mEq/L)    CO2 23  19 - 32 (mEq/L)    Glucose, Bld 145 (*) 70 - 99 (mg/dL)    BUN 18  6 - 23 (mg/dL)    Creatinine, Ser 7.82 (*) 0.50 - 1.10 (mg/dL)    Calcium 9.9  8.4 - 10.5 (mg/dL)    GFR calc non Af Amer 52 (*) >90 (mL/min)    GFR calc Af Amer 61 (*) >90 (mL/min)   URINALYSIS, ROUTINE W REFLEX MICROSCOPIC     Status: Abnormal   Collection Time   07/20/11  9:31 PM      Component Value Range Comment   Color, Urine AMBER (*) YELLOW  BIOCHEMICALS MAY BE AFFECTED BY COLOR   APPearance CLOUDY (*) CLEAR     Specific Gravity, Urine >1.030 (*) 1.005 - 1.030     pH 6.0  5.0 - 8.0     Glucose, UA NEGATIVE  NEGATIVE (mg/dL)     Hgb urine dipstick LARGE (*) NEGATIVE     Bilirubin Urine SMALL (*) NEGATIVE  Ketones, ur TRACE (*) NEGATIVE (mg/dL)    Protein, ur 606 (*) NEGATIVE (mg/dL)    Urobilinogen, UA 1.0  0.0 - 1.0 (mg/dL)    Nitrite POSITIVE (*) NEGATIVE     Leukocytes, UA TRACE (*) NEGATIVE    PREGNANCY, URINE     Status: Normal   Collection Time   07/20/11  9:31 PM      Component Value Range Comment   Preg Test, Ur NEGATIVE  NEGATIVE    URINE MICROSCOPIC-ADD ON     Status: Abnormal   Collection Time   07/20/11  9:31 PM      Component Value Range Comment   Squamous Epithelial / LPF FEW (*) RARE     WBC, UA 11-20  <3 (WBC/hpf)    RBC / HPF 21-50  <3 (RBC/hpf)    Bacteria, UA MANY (*) RARE      Radiology Reports: Dg Abd 1 View  07/20/2011  *RADIOLOGY REPORT*  Clinical Data: Right flank pain  ABDOMEN - 1 VIEW  Comparison: 07/01/2011 CT  Findings: Right-sided Nephro ureteral stent in place.  There are a couple calcific densities projecting over the right renal shadow, measuring up to 5 mm.  The known tiny left renal stones are poorly visualized.  Numerous calcifications projecting over the pelvis are presumably phlebolith.  No calcification along the course of the stent.  IMPRESSION: Calcific densities projecting over the right renal shadow are most in keeping with renal stones.  Right-sided Nephro ureteral stent in place.  Original Report Authenticated By: Waneta Martins, M.D.    Assessment/Plan  Principal Problem:  *Acute pyelonephritis Active Problems:  Nephrolithiasis  Abdominal pain  Elevated serum creatinine   #1 acute pyelonephritis. Her presentation, as well as clinical and lab findings are suggestive of acute pyelonephritis. She'll be treated with the intravenous ceftriaxone. Urine cultures will be obtained. Urine cultures recently have not been positive at least in this hospital. Urine cultures from late last year did grow Escherichia coli, which was sensitive to ceftriaxone. She'll be  given pain medications. Because of the recent history of stent we will involve urology. We'll get a renal ultrasound in the morning. She's had multiple CT scans in the last couple of weeks and there is no clear indication for a repeat CAT scan at this time.  #2 elevated serum creatinine: This could be due to dehydration. Aggressive IV fluids will be given. Urine output will be monitored closely. Renal ultrasound will be obtained in the morning.  #3 Limited venous access: A midline will be placed in the morning.   She is a full code. DVT, prophylaxis will be initiated.  Further management decisions will depend on results of further testing and patient's response to treatment.  [facility]  [facility] Pager [phone]  07/20/2011, 10:36 PM

## 2011-07-20 NOTE — ED Notes (Signed)
Pt presents with right sided back and flank pain. States the pain started about 2 days ago. Notes a history of kidney stones. Had a renal stent placed 2 weeks ago. Pt also states she had slight bleeding from her incision site yesterday but it has since stopped. Pt is AAx4 but in visible pain.

## 2011-07-20 NOTE — ED Provider Notes (Signed)
History     CSN: [id number]  Arrival date & time 07/20/11  4098   First MD Initiated Contact with Patient 07/20/11 2037      Chief Complaint  Patient presents with  . Flank Pain  . Back Pain   Patient is a level V caveat due to pain and uncooperativeness. (Consider location/radiation/quality/duration/timing/severity/associated sxs/prior treatment) The history is provided by the patient.   patient presents with right-sided back and flank pain. She will only minimally talking me. She's crying. History was also provided by someone else in the room to not appear to know her history. He states that she had a renal stent placed a couple weeks ago at a different hospital. He does not know which one. States it may have been [facility]. States patient has been having fevers. She states that she's been having blood from the site of her incision. She has a history of chronic abdominal pain in his head greater than 20 CT scans in [facility]. She does have a history of kidney stones.  Past Medical History  Diagnosis Date  . Ovarian cyst   . Kidney stones   . UTI (lower urinary tract infection)   . Calcium oxalate renal stones   . Asthma     Past Surgical History  Procedure Date  . Ovarian cyst removal   . Lithotripsy   . Laparoscopic tubal ligation   . Cholecystectomy   . Appendectomy     History reviewed. No pertinent family history.  History  Substance Use Topics  . Smoking status: Current Everyday Smoker -- 0.5 packs/day  . Smokeless tobacco: Not on file  . Alcohol Use: No    OB History    Grav Para Term Preterm Abortions TAB SAB Ect Mult Living                  Review of Systems  Unable to perform ROS   Allergies  Iohexol; Zofran; Ketorolac tromethamine; and Morphine and related  Home Medications   Current Outpatient Rx  Name Route Sig Dispense Refill  . DIMENHYDRINATE 50 MG PO TABS Oral Take 50 mg by mouth every 8 (eight) hours as needed. For nausea    .  IBUPROFEN 200 MG PO TABS Oral Take 800 mg by mouth every 6 (six) hours as needed. Pain    . TOPIRAMATE 50 MG PO TABS Oral Take 50 mg by mouth 2 (two) times daily.        BP 107/65  Pulse 116  Temp(Src) 101.2 F (38.4 C) (Oral)  Resp 22  Ht 5\' 4"  (1.626 m)  Wt 215 lb (97.523 kg)  BMI 36.90 kg/m2  SpO2 98%  LMP 06/09/2011  Physical Exam  Constitutional: She appears well-developed.  HENT:  Head: Normocephalic.  Neck: Normal range of motion.  Cardiovascular:       Tachycardia   Pulmonary/Chest: Effort normal and breath sounds normal.  Abdominal: There is tenderness.       Tenderness to right abdomen.  Genitourinary:       CVA tenderness on right.  Musculoskeletal: She exhibits no edema.  Neurological: She is alert.       Patient is poorly cooperative with history or examination.  Skin: No erythema.    ED Course  Procedures (including critical care time)  Labs Reviewed  CBC - Abnormal; Notable for the following:    WBC 20.2 (*)    All other components within normal limits  DIFFERENTIAL - Abnormal; Notable for the following:  Neutrophils Relative 91 (*)    Lymphocytes Relative 4 (*)    Neutro Abs 18.4 (*)    All other components within normal limits  BASIC METABOLIC PANEL - Abnormal; Notable for the following:    Glucose, Bld 145 (*)    Creatinine, Ser 1.37 (*)    GFR calc non Af Amer 52 (*)    GFR calc Af Amer 61 (*)    All other components within normal limits  URINALYSIS, ROUTINE W REFLEX MICROSCOPIC - Abnormal; Notable for the following:    Color, Urine AMBER (*) BIOCHEMICALS MAY BE AFFECTED BY COLOR   APPearance CLOUDY (*)    Specific Gravity, Urine >1.030 (*)    Hgb urine dipstick LARGE (*)    Bilirubin Urine SMALL (*)    Ketones, ur TRACE (*)    Protein, ur 100 (*)    Nitrite POSITIVE (*)    Leukocytes, UA TRACE (*)    All other components within normal limits  URINE MICROSCOPIC-ADD ON - Abnormal; Notable for the following:    Squamous Epithelial /  LPF FEW (*)    Bacteria, UA MANY (*)    All other components within normal limits  PREGNANCY, URINE  URINE CULTURE   No results found.   1. Acute pyelonephritis       MDM  Patient with right-sided back and flank pain. Started couple days ago. Patient states she has a recent stent on the right side for stones. Was placed at [facility] and she does not know who placed it. She is now had fevers. She has an apparent UTI. She has a white count of 20,000. Urinalysis shows infection and patient was started on Rocephin. Patient will be admitted to medicine. I discussed the case with Dr. Rito Ehrlich.        Juliet Rude. Rubin Payor, MD 07/20/11 2212

## 2011-07-20 NOTE — ED Notes (Addendum)
Pt states to not have an actual incision, states the bleeding is coming from her urethra bust has since stopped.

## 2011-07-21 ENCOUNTER — Encounter (HOSPITAL_COMMUNITY): Payer: Medicaid Other

## 2011-07-21 ENCOUNTER — Inpatient Hospital Stay (HOSPITAL_COMMUNITY): Payer: Medicaid Other

## 2011-07-21 DIAGNOSIS — N200 Calculus of kidney: Secondary | ICD-10-CM

## 2011-07-21 DIAGNOSIS — R110 Nausea: Secondary | ICD-10-CM | POA: Diagnosis present

## 2011-07-21 DIAGNOSIS — N10 Acute tubulo-interstitial nephritis: Secondary | ICD-10-CM

## 2011-07-21 LAB — CBC
HCT: 36.5 % (ref 36.0–46.0)
Hemoglobin: 12.1 g/dL (ref 12.0–15.0)
MCH: 30.3 pg (ref 26.0–34.0)
MCHC: 33.2 g/dL (ref 30.0–36.0)
RBC: 4 MIL/uL (ref 3.87–5.11)

## 2011-07-21 LAB — COMPREHENSIVE METABOLIC PANEL
ALT: 47 U/L — ABNORMAL HIGH (ref 0–35)
AST: 37 U/L (ref 0–37)
CO2: 20 mEq/L (ref 19–32)
Calcium: 9.2 mg/dL (ref 8.4–10.5)
Creatinine, Ser: 1.11 mg/dL — ABNORMAL HIGH (ref 0.50–1.10)
GFR calc Af Amer: 78 mL/min — ABNORMAL LOW (ref >90)
GFR calc non Af Amer: 67 mL/min — ABNORMAL LOW (ref >90)
Sodium: 136 mEq/L (ref 135–145)
Total Protein: 7 g/dL (ref 6.0–8.3)

## 2011-07-21 MED ORDER — SODIUM CHLORIDE 0.9 % IJ SOLN
INTRAMUSCULAR | Status: AC
  Administered 2011-07-21: 15:00:00
  Filled 2011-07-21: qty 3

## 2011-07-21 MED ORDER — SODIUM CHLORIDE 0.9 % IJ SOLN
10.0000 mL | INTRAMUSCULAR | Status: DC | PRN
  Administered 2011-07-21 – 2011-07-23 (×6): 10 mL
  Filled 2011-07-21 (×5): qty 3

## 2011-07-21 MED ORDER — POTASSIUM CHLORIDE IN NACL 40-0.9 MEQ/L-% IV SOLN
INTRAVENOUS | Status: DC
  Administered 2011-07-21 – 2011-07-23 (×6): via INTRAVENOUS
  Administered 2011-07-24: 20 mL/h via INTRAVENOUS
  Filled 2011-07-21 (×11): qty 1000

## 2011-07-21 MED ORDER — SODIUM CHLORIDE 0.9 % IJ SOLN
10.0000 mL | Freq: Two times a day (BID) | INTRAMUSCULAR | Status: DC
  Administered 2011-07-21 – 2011-07-25 (×6): 10 mL
  Filled 2011-07-21 (×6): qty 3

## 2011-07-21 MED ORDER — POTASSIUM CHLORIDE 10 MEQ/100ML IV SOLN
10.0000 meq | INTRAVENOUS | Status: AC
  Administered 2011-07-21 (×4): 10 meq via INTRAVENOUS
  Filled 2011-07-21 (×4): qty 100

## 2011-07-21 MED ORDER — PROMETHAZINE HCL 25 MG/ML IJ SOLN
12.5000 mg | INTRAMUSCULAR | Status: DC | PRN
  Administered 2011-07-21 – 2011-07-23 (×11): 12.5 mg via INTRAVENOUS
  Filled 2011-07-21 (×11): qty 1

## 2011-07-21 MED ORDER — HYDROMORPHONE HCL PF 1 MG/ML IJ SOLN
1.0000 mg | INTRAMUSCULAR | Status: DC | PRN
  Administered 2011-07-21 – 2011-07-22 (×4): 2 mg via INTRAVENOUS
  Administered 2011-07-22: 1 mg via INTRAVENOUS
  Administered 2011-07-22 – 2011-07-23 (×7): 2 mg via INTRAVENOUS
  Filled 2011-07-21 (×12): qty 2

## 2011-07-21 NOTE — Progress Notes (Signed)
Utilization Review Completed.  

## 2011-07-21 NOTE — Progress Notes (Signed)
Subjective: Pain unrelieved by current Dilaudid dosing. Still nauseated despite Zofran. Objective: Vital signs in last 24 hours: Filed Vitals:   07/20/11 1935 07/20/11 2301 07/20/11 2346 07/21/11 0454  BP: 107/65 114/61 119/68 113/73  Pulse: 116 104 100 126  Temp: 101.2 F (38.4 C) 100.1 F (37.8 C) 98.2 F (36.8 C) 99.3 F (37.4 C)  TempSrc: Oral Oral Oral Oral  Resp: 22 12 20 20   Height: 5\' 4"  (1.626 m)     Weight: 97.523 kg (215 lb)  90.13 kg (198 lb 11.2 oz)   SpO2: 98% 94% 95% 96%   Weight change:   Intake/Output Summary (Last 24 hours) at 07/21/11 1317 Last data filed at 07/21/11 1140  Gross per 24 hour  Intake     10 ml  Output      0 ml  Net     10 ml   General: Whimpering, uncomfortable in a darkened room Lungs clear to auscultation bilaterally without wheeze rhonchi or rales Cardiovascular regular rate rhythm without murmurs gallops rubs Abdomen soft right-sided tenderness Extremities no clubbing cyanosis or edema  Lab Results: Basic Metabolic Panel:  Lab 07/21/11 9147 07/20/11 2051  NA 136 136  K 3.1* 3.6  CL 102 98  CO2 20 23  GLUCOSE 140* 145*  BUN 18 18  CREATININE 1.11* 1.37*  CALCIUM 9.2 9.9  MG -- --  PHOS -- --   Liver Function Tests:  Lab 07/21/11 0613  AST 37  ALT 47*  ALKPHOS 78  BILITOT 1.1  PROT 7.0  ALBUMIN 3.3*   No results found for this basename: LIPASE:2,AMYLASE:2 in the last 168 hours No results found for this basename: AMMONIA:2 in the last 168 hours CBC:  Lab 07/21/11 0613 07/20/11 2051  WBC 17.0* 20.2*  NEUTROABS -- 18.4*  HGB 12.1 13.8  HCT 36.5 42.4  MCV 91.3 92.2  PLT 179 205   Cardiac Enzymes: No results found for this basename: CKTOTAL:3,CKMB:3,CKMBINDEX:3,TROPONINI:3 in the last 168 hours BNP: No results found for this basename: PROBNP:3 in the last 168 hours D-Dimer: No results found for this basename: DDIMER:2 in the last 168 hours CBG: No results found for this basename: GLUCAP:6 in the last 168  hours Hemoglobin A1C: No results found for this basename: HGBA1C in the last 168 hours Fasting Lipid Panel: No results found for this basename: CHOL,HDL,LDLCALC,TRIG,CHOLHDL,LDLDIRECT in the last 829 hours Thyroid Function Tests: No results found for this basename: TSH,T4TOTAL,FREET4,T3FREE,THYROIDAB in the last 168 hours Coagulation: No results found for this basename: LABPROT:4,INR:4 in the last 168 hours Anemia Panel: No results found for this basename: VITAMINB12,FOLATE,FERRITIN,TIBC,IRON,RETICCTPCT in the last 168 hours Urine Drug Screen: Drugs of Abuse     Component Value Date/Time   LABOPIA NONE DETECTED 07/16/2006 2027   COCAINSCRNUR NONE DETECTED 07/16/2006 2027   LABBENZ NONE DETECTED 07/16/2006 2027   AMPHETMU NONE DETECTED 07/16/2006 2027   THCU NONE DETECTED 07/16/2006 2027   LABBARB  Value: NONE DETECTED        DRUG SCREEN FOR MEDICAL PURPOSES ONLY.  IF CONFIRMATION IS NEEDED FOR ANY PURPOSE, NOTIFY LAB WITHIN 5 DAYS. 07/16/2006 2027    Alcohol Level: No results found for this basename: ETH:2 in the last 168 hours Urinalysis:  Lab 07/20/11 2131  COLORURINE AMBER*  LABSPEC >1.030*  PHURINE 6.0  GLUCOSEU NEGATIVE  HGBUR LARGE*  BILIRUBINUR SMALL*  KETONESUR TRACE*  PROTEINUR 100*  UROBILINOGEN 1.0  NITRITE POSITIVE*  LEUKOCYTESUR TRACE*   Micro Results: No results found for this or any  previous visit (from the past 240 hour(s)). Studies/Results: Dg Abd 1 View  07/20/2011  *RADIOLOGY REPORT*  Clinical Data: Right flank pain  ABDOMEN - 1 VIEW  Comparison: 07/01/2011 CT  Findings: Right-sided Nephro ureteral stent in place.  There are a couple calcific densities projecting over the right renal shadow, measuring up to 5 mm.  The known tiny left renal stones are poorly visualized.  Numerous calcifications projecting over the pelvis are presumably phlebolith.  No calcification along the course of the stent.  IMPRESSION: Calcific densities projecting over the right renal shadow  are most in keeping with renal stones.  Right-sided Nephro ureteral stent in place.  Original Report Authenticated By: Waneta Martins, M.D.   US Renal  07/21/2011  *RADIOLOGY REPORT*  Clinical Data: Elevated creatinine, renal calculi, right flank pain  RENAL/URINARY TRACT ULTRASOUND COMPLETE  Comparison:  02/25/2011  Findings:  Right Kidney:  13.2 cm length.  Slightly heterogeneous appearing parenchyma.  Echogenic focus mid right kidney 8 mm diameter likely a nonobstructing calculus.  Question slight thickening of right renal cortex versus left.  Small echogenic focus identified at right renal pelvis on axial images, question portion of the known right ureteral stent.  Left Kidney:  13.1 cm length.  Normal cortical thickness echogenicity.  No mass, hydronephrosis shadowing calcification.  Bladder:  Bladder well distended without gross mass.  Distal end of a ureteral stent is visualized.  IMPRESSION: Right ureteral stent. Question 8 mm nonobstructing calculus mid right kidney. Slight heterogeneity and question minimal thickening of the right renal cortex, nonspecific findings, cannot exclude mild edema or pyelonephritis. No evidence of mass or hydronephrosis.  Original Report Authenticated By: Lollie Marrow, M.D.   Dg Chest Port 1 View  07/21/2011  *RADIOLOGY REPORT*  Clinical Data:  PICC line placement  PORTABLE CHEST - 1 VIEW  Comparison: Portable exam 1005 hours compared to 05/11/2010  Findings: Right arm PICC line, tip projecting over SVC above cavoatrial junction. Minimal enlargement of cardiac silhouette. Mediastinal contours normal. Minimal pulmonary vascular cephalization. Mild right base atelectasis. Remaining lungs clear. No pleural effusion or pneumothorax. Bones unremarkable.  IMPRESSION: Tip of right arm PICC line projects over SVC. Minimal enlargement of cardiac silhouette. Mild right basilar atelectasis.  Original Report Authenticated By: Lollie Marrow, M.D.   Scheduled Meds:   .  acetaminophen  1,000 mg Oral Once  . cefTRIAXone (ROCEPHIN)  IV  1 g Intravenous Once  . cefTRIAXone (ROCEPHIN)  IV  1 g Intravenous Q24H  . heparin  5,000 Units Subcutaneous Q8H  .  HYDROmorphone (DILAUDID) injection  1 mg Intravenous Once  .  HYDROmorphone (DILAUDID) injection  1 mg Intravenous Once  . potassium chloride  10 mEq Intravenous Q1 Hr x 4  . promethazine  25 mg Intravenous Once  . sodium chloride  500 mL Intravenous Once  . sodium chloride  10-40 mL Intracatheter Q12H   Continuous Infusions:   . 0.9 % NaCl with KCl 40 mEq / L    . DISCONTD: sodium chloride 125 mL/hr at 07/20/11 2240   PRN Meds:.acetaminophen, acetaminophen, albuterol, HYDROcodone-acetaminophen, HYDROmorphone (DILAUDID) injection, promethazine, sodium chloride, DISCONTD:  HYDROmorphone (DILAUDID) injection, DISCONTD: ondansetron (ZOFRAN) IV, DISCONTD: ondansetron Assessment/Plan: Principal Problem:  *Acute pyelonephritis Active Problems:  Nephrolithiasis  Nausea  hypokalemia  Replete potassium IV. Continue Rocephin. Changed dilaudid to 1-2 mg every 3 hours as needed. Try Phenergan instead of Zofran.   LOS: 1 day   Fawne Hughley L 07/21/2011, 1:17 PM

## 2011-07-21 NOTE — Care Management Note (Unsigned)
    Page 1 of 1   07/24/2011     2:33:39 PM   CARE MANAGEMENT NOTE 07/24/2011  Patient:  [name], [name]   Account Number:  [id number]  Date Initiated:  07/21/2011  Documentation initiated by:  Rosemary Holms  Subjective/Objective Assessment:   Pt admitted with Abdominal Pain, N, V. Lives alone.     Action/Plan:   Unable to talk w/ CM due to pain in back. RN notified.   Anticipated DC Date:  07/26/2011   Anticipated DC Plan:  HOME/SELF CARE      DC Planning Services  CM consult      Choice offered to / List presented to:             Status of service:  In process, will continue to follow Medicare Important Message given?   (If response is "NO", the following Medicare IM given date fields will be blank) Date Medicare IM given:   Date Additional Medicare IM given:    Discharge Disposition:    Per UR Regulation:    If discussed at Long Length of Stay Meetings, dates discussed:    Comments:  07/24/11 1412 Arlyss Queen, RN BSN CM Cm spoke with patient about possible discharge over the weekend. Pt stated that she would return home with her mother and children and does not need any HH or DME services. Pt assured that CM would be available if any needs arose. 07/21/11 1315 Amy Leanord Hawking RN BSN CM

## 2011-07-22 DIAGNOSIS — N10 Acute tubulo-interstitial nephritis: Secondary | ICD-10-CM

## 2011-07-22 DIAGNOSIS — N200 Calculus of kidney: Secondary | ICD-10-CM

## 2011-07-22 DIAGNOSIS — R112 Nausea with vomiting, unspecified: Secondary | ICD-10-CM

## 2011-07-22 LAB — URINALYSIS, ROUTINE W REFLEX MICROSCOPIC
Glucose, UA: NEGATIVE mg/dL
Protein, ur: 30 mg/dL — AB
Specific Gravity, Urine: 1.02 (ref 1.005–1.030)

## 2011-07-22 LAB — URINE MICROSCOPIC-ADD ON

## 2011-07-22 LAB — URINE CULTURE

## 2011-07-22 NOTE — Consult Note (Signed)
[phone]

## 2011-07-22 NOTE — Progress Notes (Signed)
Subjective: Pain unchanged. Still nauseated. Has not eaten.  Objective: Vital signs in last 24 hours: Filed Vitals:   07/21/11 1401 07/21/11 2104 07/21/11 2355 07/22/11 0613  BP: 135/81 122/78  140/93  Pulse: 109 101  107  Temp: 99.5 F (37.5 C) 101.4 F (38.6 C) 99.4 F (37.4 C) 100.5 F (38.1 C)  TempSrc:  Oral Oral Oral  Resp: 19 24  24   Height:      Weight:      SpO2: 97% 100%  96%   Weight change:   Intake/Output Summary (Last 24 hours) at 07/22/11 1329 Last data filed at 07/22/11 0856  Gross per 24 hour  Intake   3370 ml  Output    300 ml  Net   3070 ml   General: asleep in darkened room. when awoken, whimpering resumes. Lungs clear to auscultation bilaterally without wheeze rhonchi or rales Cardiovascular regular rate rhythm without murmurs gallops rubs Abdomen soft right-sided tenderness Extremities no clubbing cyanosis or edema  Lab Results: Basic Metabolic Panel:  Lab 07/21/11 4782 07/20/11 2051  NA 136 136  K 3.1* 3.6  CL 102 98  CO2 20 23  GLUCOSE 140* 145*  BUN 18 18  CREATININE 1.11* 1.37*  CALCIUM 9.2 9.9  MG 1.7 --  PHOS -- --   Liver Function Tests:  Lab 07/21/11 0613  AST 37  ALT 47*  ALKPHOS 78  BILITOT 1.1  PROT 7.0  ALBUMIN 3.3*   No results found for this basename: LIPASE:2,AMYLASE:2 in the last 168 hours No results found for this basename: AMMONIA:2 in the last 168 hours CBC:  Lab 07/21/11 0613 07/20/11 2051  WBC 17.0* 20.2*  NEUTROABS -- 18.4*  HGB 12.1 13.8  HCT 36.5 42.4  MCV 91.3 92.2  PLT 179 205   Cardiac Enzymes: No results found for this basename: CKTOTAL:3,CKMB:3,CKMBINDEX:3,TROPONINI:3 in the last 168 hours BNP: No results found for this basename: PROBNP:3 in the last 168 hours D-Dimer: No results found for this basename: DDIMER:2 in the last 168 hours CBG: No results found for this basename: GLUCAP:6 in the last 168 hours Hemoglobin A1C: No results found for this basename: HGBA1C in the last 168  hours Fasting Lipid Panel: No results found for this basename: CHOL,HDL,LDLCALC,TRIG,CHOLHDL,LDLDIRECT in the last 956 hours Thyroid Function Tests: No results found for this basename: TSH,T4TOTAL,FREET4,T3FREE,THYROIDAB in the last 168 hours Coagulation: No results found for this basename: LABPROT:4,INR:4 in the last 168 hours Anemia Panel: No results found for this basename: VITAMINB12,FOLATE,FERRITIN,TIBC,IRON,RETICCTPCT in the last 168 hours Urine Drug Screen: Drugs of Abuse     Component Value Date/Time   LABOPIA NONE DETECTED 07/16/2006 2027   COCAINSCRNUR NONE DETECTED 07/16/2006 2027   LABBENZ NONE DETECTED 07/16/2006 2027   AMPHETMU NONE DETECTED 07/16/2006 2027   THCU NONE DETECTED 07/16/2006 2027   LABBARB  Value: NONE DETECTED        DRUG SCREEN FOR MEDICAL PURPOSES ONLY.  IF CONFIRMATION IS NEEDED FOR ANY PURPOSE, NOTIFY LAB WITHIN 5 DAYS. 07/16/2006 2027    Alcohol Level: No results found for this basename: ETH:2 in the last 168 hours Urinalysis:  Lab 07/20/11 2131  COLORURINE AMBER*  LABSPEC >1.030*  PHURINE 6.0  GLUCOSEU NEGATIVE  HGBUR LARGE*  BILIRUBINUR SMALL*  KETONESUR TRACE*  PROTEINUR 100*  UROBILINOGEN 1.0  NITRITE POSITIVE*  LEUKOCYTESUR TRACE*   Micro Results: Recent Results (from the past 240 hour(s))  URINE CULTURE     Status: Normal   Collection Time   07/20/11  9:31 PM      Component Value Range Status Comment   Specimen Description URINE, CLEAN CATCH   Final    Special Requests NONE   Final    Culture  Setup Time [id number]   Final    Colony Count 90,000 COLONIES/ML   Final    Culture     Final    Value: Multiple bacterial morphotypes present, none predominant. Suggest appropriate recollection if clinically indicated.   Report Status 07/22/2011 FINAL   Final    Studies/Results: Dg Abd 1 View  07/20/2011  *RADIOLOGY REPORT*  Clinical Data: Right flank pain  ABDOMEN - 1 VIEW  Comparison: 07/01/2011 CT  Findings: Right-sided Nephro ureteral stent  in place.  There are a couple calcific densities projecting over the right renal shadow, measuring up to 5 mm.  The known tiny left renal stones are poorly visualized.  Numerous calcifications projecting over the pelvis are presumably phlebolith.  No calcification along the course of the stent.  IMPRESSION: Calcific densities projecting over the right renal shadow are most in keeping with renal stones.  Right-sided Nephro ureteral stent in place.  Original Report Authenticated By: Waneta Martins, M.D.   US Renal  07/21/2011  *RADIOLOGY REPORT*  Clinical Data: Elevated creatinine, renal calculi, right flank pain  RENAL/URINARY TRACT ULTRASOUND COMPLETE  Comparison:  02/25/2011  Findings:  Right Kidney:  13.2 cm length.  Slightly heterogeneous appearing parenchyma.  Echogenic focus mid right kidney 8 mm diameter likely a nonobstructing calculus.  Question slight thickening of right renal cortex versus left.  Small echogenic focus identified at right renal pelvis on axial images, question portion of the known right ureteral stent.  Left Kidney:  13.1 cm length.  Normal cortical thickness echogenicity.  No mass, hydronephrosis shadowing calcification.  Bladder:  Bladder well distended without gross mass.  Distal end of a ureteral stent is visualized.  IMPRESSION: Right ureteral stent. Question 8 mm nonobstructing calculus mid right kidney. Slight heterogeneity and question minimal thickening of the right renal cortex, nonspecific findings, cannot exclude mild edema or pyelonephritis. No evidence of mass or hydronephrosis.  Original Report Authenticated By: Lollie Marrow, M.D.   Dg Chest Port 1 View  07/21/2011  *RADIOLOGY REPORT*  Clinical Data:  PICC line placement  PORTABLE CHEST - 1 VIEW  Comparison: Portable exam 1005 hours compared to 05/11/2010  Findings: Right arm PICC line, tip projecting over SVC above cavoatrial junction. Minimal enlargement of cardiac silhouette. Mediastinal contours normal. Minimal  pulmonary vascular cephalization. Mild right base atelectasis. Remaining lungs clear. No pleural effusion or pneumothorax. Bones unremarkable.  IMPRESSION: Tip of right arm PICC line projects over SVC. Minimal enlargement of cardiac silhouette. Mild right basilar atelectasis.  Original Report Authenticated By: Lollie Marrow, M.D.   Scheduled Meds:    . cefTRIAXone (ROCEPHIN)  IV  1 g Intravenous Q24H  . heparin  5,000 Units Subcutaneous Q8H  . potassium chloride  10 mEq Intravenous Q1 Hr x 4  . sodium chloride  10-40 mL Intracatheter Q12H  . sodium chloride       Continuous Infusions:    . 0.9 % NaCl with KCl 40 mEq / L 125 mL/hr at 07/22/11 0807   PRN Meds:.acetaminophen, acetaminophen, albuterol, HYDROcodone-acetaminophen, HYDROmorphone (DILAUDID) injection, promethazine, sodium chloride Assessment/Plan: Principal Problem:  *Acute pyelonephritis Active Problems:  Nephrolithiasis  Nausea  hypokalemia  Culture only shows 90,000 colonies of multiple bacterial morphotypes. Will repeat clean catch UA and CBC with differential tomorrow. Repeat bmet tomorrow.  Continue Rocephin for now.   LOS: 2 days   Charolett Yarrow L 07/22/2011, 1:29 PM

## 2011-07-22 NOTE — Consult Note (Signed)
[name], [name]              ACCOUNT NO.:  [id number]  MEDICAL RECORD NO.:  [id number]  LOCATION:  A335                          FACILITY:  [facility]  PHYSICIAN:  Ky Barban, M.D.DATE OF BIRTH:  1985-11-19  DATE OF CONSULTATION: DATE OF DISCHARGE:                                CONSULTATION   CHIEF COMPLAINT:  Right flank pain.  This 26 year old female is well known to me.  I had seen her in [place] a few weeks ago with the same problem and she underwent complete workup including cystoscopy, retrograde pyelogram, CT abdomen and pelvis.  I was unable to find any pathology and explain this pain and then she went to [place], where they have inserted a double-J stent on that side, but she is still having pain.  She was supposed to come back for follow up in the office.  She did not show up in the office.  She says she was having pain so she went to [place].  Since she came here she has 1 urine culture, but looks like it is a contaminated specimen which is showing positive culture, then the 2nd specimen is sent again with a catheterized specimen.  She is not running any fever, but her white count today I see it is markedly elevated.  It is possible she has pyelonephritis on that right side.  I will check her previous cultures from [facility].  Her renal ultrasound which was done in this hospital in January this year is completely normal.  Now, she has a double-J stent on that side.  Her WBC count yesterday was 17,000 and day before it was 20,000 and looks like she may have an infection on that side and she is taking antibiotics.  I will continue using the same antibiotics.  We will follow her clinically.  I need to get the rest of the reports from [facility] tomorrow.  Her BUN and creatinine is normal.     Ky Barban, M.D.     MIJ/MEDQ  D:  07/22/2011  T:  07/22/2011  Job:  [id number]

## 2011-07-23 DIAGNOSIS — R112 Nausea with vomiting, unspecified: Secondary | ICD-10-CM

## 2011-07-23 DIAGNOSIS — N200 Calculus of kidney: Secondary | ICD-10-CM

## 2011-07-23 DIAGNOSIS — N10 Acute tubulo-interstitial nephritis: Secondary | ICD-10-CM

## 2011-07-23 LAB — CBC
Platelets: 206 10*3/uL (ref 150–400)
RDW: 14.3 % (ref 11.5–15.5)
WBC: 8.2 10*3/uL (ref 4.0–10.5)

## 2011-07-23 LAB — BASIC METABOLIC PANEL
CO2: 18 mEq/L — ABNORMAL LOW (ref 19–32)
Chloride: 107 mEq/L (ref 96–112)
Sodium: 137 mEq/L (ref 135–145)

## 2011-07-23 LAB — DIFFERENTIAL
Basophils Absolute: 0 10*3/uL (ref 0.0–0.1)
Lymphocytes Relative: 16 % (ref 12–46)
Neutro Abs: 5.7 10*3/uL (ref 1.7–7.7)

## 2011-07-23 MED ORDER — ENSURE PUDDING PO PUDG
1.0000 | Freq: Three times a day (TID) | ORAL | Status: DC
  Administered 2011-07-24 – 2011-07-25 (×4): 1 via ORAL

## 2011-07-23 MED ORDER — PROMETHAZINE HCL 12.5 MG PO TABS
25.0000 mg | ORAL_TABLET | Freq: Four times a day (QID) | ORAL | Status: DC | PRN
Start: 2011-07-23 — End: 2011-07-25
  Administered 2011-07-23 – 2011-07-25 (×6): 25 mg via ORAL
  Filled 2011-07-23: qty 2
  Filled 2011-07-23 (×2): qty 1
  Filled 2011-07-23 (×2): qty 2
  Filled 2011-07-23: qty 1
  Filled 2011-07-23: qty 2
  Filled 2011-07-23: qty 1

## 2011-07-23 MED ORDER — PIPERACILLIN-TAZOBACTAM 3.375 G IVPB
3.3750 g | Freq: Three times a day (TID) | INTRAVENOUS | Status: DC
  Administered 2011-07-23 – 2011-07-24 (×3): 3.375 g via INTRAVENOUS
  Filled 2011-07-23 (×13): qty 50

## 2011-07-23 MED ORDER — OXYCODONE HCL 5 MG PO TABS
5.0000 mg | ORAL_TABLET | ORAL | Status: DC | PRN
  Administered 2011-07-23: 10 mg via ORAL
  Administered 2011-07-23: 5 mg via ORAL
  Administered 2011-07-24 – 2011-07-25 (×9): 10 mg via ORAL
  Filled 2011-07-23 (×2): qty 2
  Filled 2011-07-23: qty 1
  Filled 2011-07-23 (×8): qty 2

## 2011-07-23 MED ORDER — HYDROMORPHONE HCL PF 1 MG/ML IJ SOLN
1.0000 mg | INTRAMUSCULAR | Status: DC | PRN
  Administered 2011-07-23 – 2011-07-24 (×2): 1 mg via INTRAVENOUS
  Filled 2011-07-23 (×2): qty 1

## 2011-07-23 NOTE — Progress Notes (Addendum)
Pt is coughing and gagging, coughing up small amt of yellowish thick phlegm.  No pills seen in tray pt had used to spit up in.  Warmed up pt's lunch tray per pt request, she stated she may try and eat later, so far has not eaten anything on tray.

## 2011-07-23 NOTE — Progress Notes (Signed)
Pt asleep in bed at this time.  Did not want to eat dinner when brought to her, did not eat her lunch at all.  Refused to get OOB prior to dinner.

## 2011-07-23 NOTE — Progress Notes (Signed)
Dr. Lawerance Cruel note reviewed. Nurses report little solid intake. Tolerating liquids. Requesting IV pain medication and IV Phenergan routinely. Got up to chair a bit but has not really ambulated much.  Subjective: Pain unchanged. Still nauseated.   Objective: Vital signs in last 24 hours: Filed Vitals:   07/22/11 0613 07/22/11 1341 07/22/11 2207 07/23/11 0524  BP: 140/93 134/93 140/87 144/93  Pulse: 107 90 109 92  Temp: 100.5 F (38.1 C) 101.2 F (38.4 C) 101 F (38.3 C) 101.2 F (38.4 C)  TempSrc: Oral Oral Oral Oral  Resp: 24 22 24 22   Height:      Weight:      SpO2: 96% 97% 96% 97%   Weight change:   Intake/Output Summary (Last 24 hours) at 07/23/11 1417 Last data filed at 07/23/11 1039  Gross per 24 hour  Intake   2125 ml  Output      0 ml  Net   2125 ml   General: More alert. Initially comfortable, but when I entered the room started panting Lungs clear to auscultation bilaterally without wheeze rhonchi or rales Cardiovascular regular rate rhythm without murmurs gallops rubs Abdomen soft right-sided tenderness Extremities no clubbing cyanosis or edema  Lab Results: Basic Metabolic Panel:  Lab 07/23/11 4540 07/21/11 0613  NA 137 136  K 4.5 3.1*  CL 107 102  CO2 18* 20  GLUCOSE 89 140*  BUN 9 18  CREATININE 0.85 1.11*  CALCIUM 9.3 9.2  MG -- 1.7  PHOS -- --   Liver Function Tests:  Lab 07/21/11 0613  AST 37  ALT 47*  ALKPHOS 78  BILITOT 1.1  PROT 7.0  ALBUMIN 3.3*   No results found for this basename: LIPASE:2,AMYLASE:2 in the last 168 hours No results found for this basename: AMMONIA:2 in the last 168 hours CBC:  Lab 07/23/11 0617 07/21/11 0613 07/20/11 2051  WBC 8.2 17.0* --  NEUTROABS 5.7 -- 18.4*  HGB 11.3* 12.1 --  HCT 34.9* 36.5 --  MCV 90.2 91.3 --  PLT 206 179 --   Cardiac Enzymes: No results found for this basename: CKTOTAL:3,CKMB:3,CKMBINDEX:3,TROPONINI:3 in the last 168 hours BNP: No results found for this basename: PROBNP:3 in  the last 168 hours D-Dimer: No results found for this basename: DDIMER:2 in the last 168 hours CBG: No results found for this basename: GLUCAP:6 in the last 168 hours Hemoglobin A1C: No results found for this basename: HGBA1C in the last 168 hours Fasting Lipid Panel: No results found for this basename: CHOL,HDL,LDLCALC,TRIG,CHOLHDL,LDLDIRECT in the last 981 hours Thyroid Function Tests: No results found for this basename: TSH,T4TOTAL,FREET4,T3FREE,THYROIDAB in the last 168 hours Coagulation: No results found for this basename: LABPROT:4,INR:4 in the last 168 hours Anemia Panel: No results found for this basename: VITAMINB12,FOLATE,FERRITIN,TIBC,IRON,RETICCTPCT in the last 168 hours Urine Drug Screen: Drugs of Abuse     Component Value Date/Time   LABOPIA NONE DETECTED 07/16/2006 2027   COCAINSCRNUR NONE DETECTED 07/16/2006 2027   LABBENZ NONE DETECTED 07/16/2006 2027   AMPHETMU NONE DETECTED 07/16/2006 2027   THCU NONE DETECTED 07/16/2006 2027   LABBARB  Value: NONE DETECTED        DRUG SCREEN FOR MEDICAL PURPOSES ONLY.  IF CONFIRMATION IS NEEDED FOR ANY PURPOSE, NOTIFY LAB WITHIN 5 DAYS. 07/16/2006 2027    Alcohol Level: No results found for this basename: ETH:2 in the last 168 hours Urinalysis:  Lab 07/22/11 1440 07/20/11 2131  COLORURINE YELLOW AMBER*  LABSPEC 1.020 >1.030*  PHURINE 6.0 6.0  GLUCOSEU NEGATIVE  NEGATIVE  HGBUR SMALL* LARGE*  BILIRUBINUR NEGATIVE SMALL*  KETONESUR NEGATIVE TRACE*  PROTEINUR 30* 100*  UROBILINOGEN 0.2 1.0  NITRITE NEGATIVE POSITIVE*  LEUKOCYTESUR NEGATIVE TRACE*   Micro Results: Recent Results (from the past 240 hour(s))  URINE CULTURE     Status: Normal   Collection Time   07/20/11  9:31 PM      Component Value Range Status Comment   Specimen Description URINE, CLEAN CATCH   Final    Special Requests NONE   Final    Culture  Setup Time [id number]   Final    Colony Count 90,000 COLONIES/ML   Final    Culture     Final    Value: Multiple  bacterial morphotypes present, none predominant. Suggest appropriate recollection if clinically indicated.   Report Status 07/22/2011 FINAL   Final    Studies/Results: No results found. Scheduled Meds:    . cefTRIAXone (ROCEPHIN)  IV  1 g Intravenous Q24H  . feeding supplement  1 Container Oral TID BM  . heparin  5,000 Units Subcutaneous Q8H  . sodium chloride  10-40 mL Intracatheter Q12H   Continuous Infusions:    . 0.9 % NaCl with KCl 40 mEq / L 125 mL/hr at 07/23/11 0827   PRN Meds:.acetaminophen, acetaminophen, albuterol, HYDROmorphone (DILAUDID) injection, oxyCODONE, promethazine, sodium chloride, DISCONTD: HYDROcodone-acetaminophen, DISCONTD:  HYDROmorphone (DILAUDID) injection, DISCONTD: promethazine Assessment/Plan: Principal Problem:  *Acute pyelonephritis Active Problems:  Nephrolithiasis  Nausea  hypokalemia resolved  Repeat urinalysis improved, leukocytosis resolved, but still having fevers. Will change to Zosyn. Stop Rocephin. Encouraged patient to eat and ambulate. Will add in sure. Will change Phenergan to oral route. Encourage pain medication by mouth with Dilaudid IV only for refractory pain. Patient is not progressing as quickly as expected. Appreciate urology's assistance.   LOS: 3 days   [name] L 07/23/2011, 2:17 PM

## 2011-07-24 DIAGNOSIS — N10 Acute tubulo-interstitial nephritis: Secondary | ICD-10-CM

## 2011-07-24 DIAGNOSIS — R112 Nausea with vomiting, unspecified: Secondary | ICD-10-CM

## 2011-07-24 DIAGNOSIS — N200 Calculus of kidney: Secondary | ICD-10-CM

## 2011-07-24 MED ORDER — SULFAMETHOXAZOLE-TMP DS 800-160 MG PO TABS
1.0000 | ORAL_TABLET | Freq: Two times a day (BID) | ORAL | Status: DC
  Administered 2011-07-24 – 2011-07-25 (×3): 1 via ORAL
  Filled 2011-07-24 (×3): qty 1

## 2011-07-24 NOTE — Progress Notes (Addendum)
Nurses report patient is not eating. Not ambulating. They have witnessed no emesis.  Subjective: Pain unchanged. Still nauseated. "The nurses had to clean it up."  Objective: Vital signs in last 24 hours: Filed Vitals:   07/23/11 1516 07/23/11 2028 07/23/11 2057 07/24/11 0558  BP: 152/98  118/81 131/90  Pulse: 92  87 81  Temp: 100 F (37.8 C)  99.6 F (37.6 C) 98.9 F (37.2 C)  TempSrc: Oral  Oral Oral  Resp: 20  20 20   Height:      Weight:      SpO2: 96% 97% 95% 95%   Weight change:   Intake/Output Summary (Last 24 hours) at 07/24/11 1444 Last data filed at 07/24/11 0900  Gross per 24 hour  Intake 1396.25 ml  Output   1400 ml  Net  -3.75 ml   General: More alert. Initially comfortable, but when I entered the room started panting Lungs clear to auscultation bilaterally without wheeze rhonchi or rales Cardiovascular regular rate rhythm without murmurs gallops rubs Abdomen soft right-sided tenderness Extremities no clubbing cyanosis or edema  Lab Results: Basic Metabolic Panel:  Lab 07/23/11 1914 07/21/11 0613  NA 137 136  K 4.5 3.1*  CL 107 102  CO2 18* 20  GLUCOSE 89 140*  BUN 9 18  CREATININE 0.85 1.11*  CALCIUM 9.3 9.2  MG -- 1.7  PHOS -- --   Liver Function Tests:  Lab 07/21/11 0613  AST 37  ALT 47*  ALKPHOS 78  BILITOT 1.1  PROT 7.0  ALBUMIN 3.3*   No results found for this basename: LIPASE:2,AMYLASE:2 in the last 168 hours No results found for this basename: AMMONIA:2 in the last 168 hours CBC:  Lab 07/23/11 0617 07/21/11 0613 07/20/11 2051  WBC 8.2 17.0* --  NEUTROABS 5.7 -- 18.4*  HGB 11.3* 12.1 --  HCT 34.9* 36.5 --  MCV 90.2 91.3 --  PLT 206 179 --   Cardiac Enzymes: No results found for this basename: CKTOTAL:3,CKMB:3,CKMBINDEX:3,TROPONINI:3 in the last 168 hours BNP: No results found for this basename: PROBNP:3 in the last 168 hours D-Dimer: No results found for this basename: DDIMER:2 in the last 168 hours CBG: No results  found for this basename: GLUCAP:6 in the last 168 hours Hemoglobin A1C: No results found for this basename: HGBA1C in the last 168 hours Fasting Lipid Panel: No results found for this basename: CHOL,HDL,LDLCALC,TRIG,CHOLHDL,LDLDIRECT in the last 782 hours Thyroid Function Tests: No results found for this basename: TSH,T4TOTAL,FREET4,T3FREE,THYROIDAB in the last 168 hours Coagulation: No results found for this basename: LABPROT:4,INR:4 in the last 168 hours Anemia Panel: No results found for this basename: VITAMINB12,FOLATE,FERRITIN,TIBC,IRON,RETICCTPCT in the last 168 hours Urine Drug Screen: Drugs of Abuse     Component Value Date/Time   LABOPIA NONE DETECTED 07/16/2006 2027   COCAINSCRNUR NONE DETECTED 07/16/2006 2027   LABBENZ NONE DETECTED 07/16/2006 2027   AMPHETMU NONE DETECTED 07/16/2006 2027   THCU NONE DETECTED 07/16/2006 2027   LABBARB  Value: NONE DETECTED        DRUG SCREEN FOR MEDICAL PURPOSES ONLY.  IF CONFIRMATION IS NEEDED FOR ANY PURPOSE, NOTIFY LAB WITHIN 5 DAYS. 07/16/2006 2027    Alcohol Level: No results found for this basename: ETH:2 in the last 168 hours Urinalysis:  Lab 07/22/11 1440 07/20/11 2131  COLORURINE YELLOW AMBER*  LABSPEC 1.020 >1.030*  PHURINE 6.0 6.0  GLUCOSEU NEGATIVE NEGATIVE  HGBUR SMALL* LARGE*  BILIRUBINUR NEGATIVE SMALL*  KETONESUR NEGATIVE TRACE*  PROTEINUR 30* 100*  UROBILINOGEN 0.2 1.0  NITRITE NEGATIVE POSITIVE*  LEUKOCYTESUR NEGATIVE TRACE*   Micro Results: Recent Results (from the past 240 hour(s))  URINE CULTURE     Status: Normal   Collection Time   07/20/11  9:31 PM      Component Value Range Status Comment   Specimen Description URINE, CLEAN CATCH   Final    Special Requests NONE   Final    Culture  Setup Time [id number]   Final    Colony Count 90,000 COLONIES/ML   Final    Culture     Final    Value: Multiple bacterial morphotypes present, none predominant. Suggest appropriate recollection if clinically indicated.   Report  Status 07/22/2011 FINAL   Final    Studies/Results: No results found. Scheduled Meds:    . feeding supplement  1 Container Oral TID BM  . heparin  5,000 Units Subcutaneous Q8H  . sodium chloride  10-40 mL Intracatheter Q12H  . sulfamethoxazole-trimethoprim  1 tablet Oral Q12H  . DISCONTD: piperacillin-tazobactam (ZOSYN)  IV  3.375 g Intravenous Q8H   Continuous Infusions:    . 0.9 % NaCl with KCl 40 mEq / L 20 mL/hr (07/24/11 0500)   PRN Meds:.acetaminophen, acetaminophen, albuterol, oxyCODONE, promethazine, sodium chloride, DISCONTD:  HYDROmorphone (DILAUDID) injection Assessment/Plan: Principal Problem:  *Acute pyelonephritis Active Problems:  Nephrolithiasis  Nausea  hypokalemia resolved  Changed to by mouth antibiotics. Bactrim. Discontinue IV Dilaudid. Patient has a history of chronic right-sided pain going back many years. Clinically, seems to be improving. Patient needs maximal encouragement to ambulate, eat. No longer febrile. Check labs in the morning.   LOS: 4 days   Georgette Helmer L 07/24/2011, 2:44 PM   Addendum:  Discussed with Dr. Jerre Simon.  Pain complaints similar at [facility].  He agrees with plans and recommends outpatient referral to pain management specialist.

## 2011-07-25 DIAGNOSIS — R112 Nausea with vomiting, unspecified: Secondary | ICD-10-CM

## 2011-07-25 DIAGNOSIS — N200 Calculus of kidney: Secondary | ICD-10-CM

## 2011-07-25 DIAGNOSIS — N10 Acute tubulo-interstitial nephritis: Secondary | ICD-10-CM

## 2011-07-25 LAB — DIFFERENTIAL
Eosinophils Relative: 1 % (ref 0–5)
Lymphs Abs: 2.6 10*3/uL (ref 0.7–4.0)
Monocytes Relative: 13 % — ABNORMAL HIGH (ref 3–12)
Neutro Abs: 8 10*3/uL — ABNORMAL HIGH (ref 1.7–7.7)

## 2011-07-25 LAB — CBC
HCT: 34.7 % — ABNORMAL LOW (ref 36.0–46.0)
MCV: 88.5 fL (ref 78.0–100.0)
Platelets: 275 10*3/uL (ref 150–400)
RBC: 3.92 MIL/uL (ref 3.87–5.11)
WBC: 12.6 10*3/uL — ABNORMAL HIGH (ref 4.0–10.5)

## 2011-07-25 LAB — BASIC METABOLIC PANEL
GFR calc non Af Amer: >90 mL/min (ref >90)
Glucose, Bld: 89 mg/dL (ref 70–99)
Potassium: 3.8 mEq/L (ref 3.5–5.1)
Sodium: 134 mEq/L — ABNORMAL LOW (ref 135–145)

## 2011-07-25 MED ORDER — OXYCODONE HCL 5 MG PO TABS: 5.0000 mg | ORAL_TABLET | Freq: Three times a day (TID) | ORAL | Status: DC | PRN

## 2011-07-25 MED ORDER — PROMETHAZINE HCL 25 MG PO TABS: 25.0000 mg | ORAL_TABLET | Freq: Four times a day (QID) | ORAL | Status: DC | PRN

## 2011-07-25 MED ORDER — ACETAMINOPHEN 325 MG PO TABS: 650.0000 mg | ORAL_TABLET | Freq: Four times a day (QID) | ORAL | Status: DC | PRN

## 2011-07-25 MED ORDER — SULFAMETHOXAZOLE-TMP DS 800-160 MG PO TABS: 1.0000 | ORAL_TABLET | Freq: Two times a day (BID) | ORAL | Status: AC

## 2011-07-25 NOTE — Plan of Care (Signed)
Problem: Phase II Progression Outcomes Goal: IV changed to normal saline lock Outcome: Completed/Met Date Met:  07/25/11 PICC Discontinued on 07/25/11  Problem: Discharge Progression Outcomes Goal: Complications resolved/controlled Outcome: Progressing Pt with complaints of rt lower quad rant pain a 8 to 10 on scale Goal: Tolerating diet Outcome: Adequate for Discharge Poor intake Goal: Other Discharge Outcomes/Goals Outcome: Completed/Met Date Met:  07/25/11 I read the discharge instructions to the pt  She verbalized understanding of all instructions Pt contunues with rt quadrant pain complaints   Discharge to home.

## 2011-07-28 NOTE — Discharge Summary (Signed)
Physician Discharge Summary  Patient ID: [name] MRN: [id number] DOB/AGE: 1985/11/10 26 y.o.  Admit date: 07/20/2011 Discharge date: 07/25/2011  Discharge Diagnoses:  Principal Problem:  *Acute pyelonephritis Active Problems:  Nephrolithiasis  Nausea Chronic RLQ pain  Medication List  As of 07/25/2011 12:34 PM   STOP taking these medications         dimenhyDRINATE 50 MG tablet         TAKE these medications         acetaminophen 325 MG tablet   Commonly known as: TYLENOL   Take 2 tablets (650 mg total) by mouth every 6 (six) hours as needed (or Fever >/= 101).      ibuprofen 200 MG tablet   Commonly known as: ADVIL,MOTRIN   Take 800 mg by mouth every 6 (six) hours as needed. Pain      oxyCODONE 5 MG immediate release tablet   Commonly known as: Oxy IR/ROXICODONE   Take 1-2 tablets (5-10 mg total) by mouth every 8 (eight) hours as needed for pain.      promethazine 25 MG tablet   Commonly known as: PHENERGAN   Take 1 tablet (25 mg total) by mouth every 6 (six) hours as needed.      sulfamethoxazole-trimethoprim 800-160 MG per tablet   Commonly known as: BACTRIM DS   Take 1 tablet by mouth every 12 (twelve) hours.      topiramate 50 MG tablet   Commonly known as: TOPAMAX   Take 50 mg by mouth 2 (two) times daily.            Discharge Orders    Future Orders Please Complete By Expires   Activity as tolerated - No restrictions         Follow-up Information    Follow up with urologist who placed stent. (next week)       Follow up with your primary care provider. (pain management referral)          Disposition: 01-Home or Self Care  Discharged Condition: stable  Consults: Treatment Team:  Ky Barban, MD  Labs:     CHEM PROFILE    Sodium 136  137  134     Potassium 3.1  4.5  3.8     Chloride 102  107  100     CO2 20  18  21      BUN 18  9  8      Creatinine, Ser 1.11  0.85  0.83     Calcium 9.2  9.3  9.6     GFR calc non Af  Amer 67  90 mL/min">90  90 mL/min">90     GFR calc Af Amer 78   90 mL/min  The eGFR has been calculated using the CKD EPI equation. This calculation has not been validated in all clinical situations. eGFR's persistently <90 mL/min signify possible Chronic Kidney Disease.">9090 mL/min  The eGFR has been calculated using the CKD EPI equation. This calculation has not been validated in all clinical situations. eGFR's persistently <90 mL/min signify possible Chronic Kidney Disease." border=0 src="file:///C:/PROGRAM%20FILES%20(X86)/EPICSYS/V7.8/EN-US/Images/IP_COMMENT_EXIST.gif" width=5 height=10  90 mL/min  The eGFR has been calculated using the CKD EPI equation. This calculation has not been validated in all clinical situations. eGFR's persistently <90 mL/min signify possible Chronic Kidney Disease.">9090 mL/min  The eGFR has been calculated using the CKD EPI equation. This calculation has not been validated in all clinical situations. eGFR's persistently <90 mL/min signify possible Chronic Kidney Disease." border=0 src="file:///C:/PROGRAM%20FILES%20(X86)/EPICSYS/V7.8/EN-US/Images/IP_COMMENT_EXIST.gif"  width=5 height=10     Glucose, Bld 140  89  89     Magnesium 1.7         Alkaline Phosphatase 78         Albumin 3.3         AST 37         ALT 47         Total Protein 7.0         Total Bilirubin 1.1          CBC    WBC 17.0  8.2  12.6     RBC 4.00  3.87  3.92     Hemoglobin 12.1  11.3  11.7      HCT 36.5  34.9  34.7     MCV 91.3  90.2  88.5     MCH 30.3  29.2  29.8     MCHC 33.2  32.4  33.7     RDW 13.2  14.3  14.2     Platelets 179  206  275      DIFFERENTIAL    Neutrophils Relative 91  69  63     Lymphocytes Relative 4  16  21      Monocytes Relative 5  14  13      Eosinophils Relative 0  0  1     Basophils Relative 0  0  2     Neutro Abs 18.4  5.7  8.0     Lymphs Abs 0.8  1.4  2.6     Monocytes Absolute 1.0  1.2  1.6     Eosinophils Absolute 0.0  0.0  0.1     Basophils Absolute  0.0  0.0  0.3     RBC Morphology     STOMATOCYTES     WBC Morphology TOXIC GRANULATION     VACUOLATED NEUTROPHILS      Smear Review     LARGE PLATELETS PRESENT       DIABETES    Glucose, Bld 140  89  89      GONADAL    Preg Test, Ur 24 mIU/mL"NEGATIVE 24 mIU/mL" border=0 src="file:///C:/PROGRAM%20FILES%20(X86)/EPICSYS/V7.8/EN-US/Images/IP_COMMENT_EXIST.gif" width=5 height=10          URINALYSIS    Color, Urine AMBER   YELLOW       APPearance CLOUDY  CLEAR       Specific Gravity, Urine 1.030">1.030  1.020       pH 6.0  6.0       Glucose, UA NEGATIVE  NEGATIVE       Bilirubin Urine SMALL  NEGATIVE       Ketones, ur TRACE  NEGATIVE       Protein, ur 100  30       Urobilinogen, UA 1.0  0.2       Nitrite POSITIVE  NEGATIVE       Leukocytes, UA TRACE  NEGATIVE       Hgb urine dipstick LARGE  SMALL       WBC, UA 11-20  3-6       RBC / HPF 21-50  3-6       Squamous Epithelial / LPF FEW  MANY       Bacteria, UA MANY  MANY        URINE CHEMISTRY    Preg Test, Ur 24 mIU/mL"NEGATIVE 24 mIU/mL" border=0 src="file:///C:/PROGRAM%20FILES%20(X86)/EPICSYS/V7.8/EN-US/Images/IP_COMMENT_EXIST.gif" width=5 height=10   Urine culture: multiple bacterial morphotypes  Diagnostics:  Ct Abdomen Pelvis Wo Contrast  07/01/2011  *  RADIOLOGY REPORT*  Clinical Data: Hematuria with nausea, vomiting and suprapubic abdominal pain.  CT ABDOMEN AND PELVIS WITHOUT CONTRAST  Technique:  Multidetector CT imaging of the abdomen and pelvis was performed following the standard protocol without intravenous contrast.  Comparison: Prior examination 06/02/2011.  Findings: Numerous bilateral renal calculi have not substantially changed.  There is no evidence of hydronephrosis, perinephric soft tissue stranding or ureteral calculus.  Bilateral pelvic calcifications are unchanged, attributed to phleboliths.  There is no evidence of bladder calculus.  Both kidneys otherwise appear normal.  There is mild hepatic steatosis.   There is no biliary dilatation status post cholecystectomy.  The spleen, pancreas and adrenal glands appear unremarkable.  The lung bases are clear.  There is no pleural effusion.  Broad-based umbilical hernia appears unchanged.  A small radial density medial to the cecum on coronal image 39 is unchanged, probably related to prior appendectomy.  There is no surrounding inflammatory change.  The uterus and ovaries appear normal.  No acute osseous findings or typical pars defects are demonstrated. There is an atypical defect involving the inferior right L2 articulating facet which appears unchanged.  IMPRESSION:  1.  Unchanged bilateral renal calculi.  No evidence of ureteral calculus or hydronephrosis. 2.  No acute abdominal findings demonstrated.  Original Report Authenticated By: Gerrianne Scale, M.D.   Dg Abd 1 View  07/20/2011  *RADIOLOGY REPORT*  Clinical Data: Right flank pain  ABDOMEN - 1 VIEW  Comparison: 07/01/2011 CT  Findings: Right-sided Nephro ureteral stent in place.  There are a couple calcific densities projecting over the right renal shadow, measuring up to 5 mm.  The known tiny left renal stones are poorly visualized.  Numerous calcifications projecting over the pelvis are presumably phlebolith.  No calcification along the course of the stent.  IMPRESSION: Calcific densities projecting over the right renal shadow are most in keeping with renal stones.  Right-sided Nephro ureteral stent in place.  Original Report Authenticated By: Waneta Martins, M.D.   US Renal  07/21/2011  *RADIOLOGY REPORT*  Clinical Data: Elevated creatinine, renal calculi, right flank pain  RENAL/URINARY TRACT ULTRASOUND COMPLETE  Comparison:  02/25/2011  Findings:  Right Kidney:  13.2 cm length.  Slightly heterogeneous appearing parenchyma.  Echogenic focus mid right kidney 8 mm diameter likely a nonobstructing calculus.  Question slight thickening of right renal cortex versus left.  Small echogenic focus identified  at right renal pelvis on axial images, question portion of the known right ureteral stent.  Left Kidney:  13.1 cm length.  Normal cortical thickness echogenicity.  No mass, hydronephrosis shadowing calcification.  Bladder:  Bladder well distended without gross mass.  Distal end of a ureteral stent is visualized.  IMPRESSION: Right ureteral stent. Question 8 mm nonobstructing calculus mid right kidney. Slight heterogeneity and question minimal thickening of the right renal cortex, nonspecific findings, cannot exclude mild edema or pyelonephritis. No evidence of mass or hydronephrosis.  Original Report Authenticated By: Lollie Marrow, M.D.   Dg Chest Port 1 View  07/21/2011  *RADIOLOGY REPORT*  Clinical Data:  PICC line placement  PORTABLE CHEST - 1 VIEW  Comparison: Portable exam 1005 hours compared to 05/11/2010  Findings: Right arm PICC line, tip projecting over SVC above cavoatrial junction. Minimal enlargement of cardiac silhouette. Mediastinal contours normal. Minimal pulmonary vascular cephalization. Mild right base atelectasis. Remaining lungs clear. No pleural effusion or pneumothorax. Bones unremarkable.  IMPRESSION: Tip of right arm PICC line projects over SVC. Minimal enlargement of cardiac silhouette.  Mild right basilar atelectasis.  Original Report Authenticated By: Lollie Marrow, M.D.    Procedures: none  Full Code   Hospital Course: See H&P for complete admission details.  [name] is a 26 year old black female with chronic RLQ pain, and nephrolithiasis.  She presented with abdominal pain, nausea, vomiting.  She had been hospitalized recently at [facility], [facility].  In the ED, she had a temp of 101. WBC was 10K. Urinalysis showed nitrites, leukocyte esterase, many bacteria. US showed possible pyelonephrosis, no hydro.    She was admitted to the hospitalist service and treated initially with rocephin, subsequently switched to zosyn. Urine culture grew multiple  bacterial morphotypes.  Urology was consulted and agreed with management.  She has been seen previously by Dr. Jerre Simon.  Pain management proved difficult.  Upon further review of the chart, she has been evaluated in the ED multiple times for abdominal pain, admitted multiple times. She has had over 20 CT scans of the abdomen over the past few years, ultrasounds, KUBs.  Her fever, repeat UA, leukocytosis were all improved by the time of discharge.  She is tolerating oral Bactrim.  Dr. Jerre Simon recommends referral to pain management, and I agree.  Total time on the day of discharge over 30 minutes.  Discharge Exam:  Blood pressure 105/72, pulse 94, temperature 98.9 F (37.2 C), temperature source Oral, resp. rate 17, height 5\' 4"  (1.626 m), weight 90.13 kg (198 lb 11.2 oz), last menstrual period 06/09/2011, SpO2 97.00%. Unchanged from 07/24/11   Signed: Crista Curb L 07/25/2011, 12:34 PM

## 2011-08-01 ENCOUNTER — Emergency Department (HOSPITAL_COMMUNITY)
Admission: EM | Admit: 2011-08-01 | Discharge: 2011-08-01 | Disposition: A | Discharge status: Home / Self Care | Payer: Medicaid Other | Attending: Emergency Medicine | Admitting: Emergency Medicine

## 2011-08-01 ENCOUNTER — Encounter (HOSPITAL_COMMUNITY): Payer: Self-pay

## 2011-08-01 DIAGNOSIS — R109 Unspecified abdominal pain: Secondary | ICD-10-CM | POA: Insufficient documentation

## 2011-08-01 DIAGNOSIS — R1110 Vomiting, unspecified: Secondary | ICD-10-CM | POA: Insufficient documentation

## 2011-08-01 DIAGNOSIS — N390 Urinary tract infection, site not specified: Secondary | ICD-10-CM | POA: Insufficient documentation

## 2011-08-01 DIAGNOSIS — R300 Dysuria: Secondary | ICD-10-CM | POA: Insufficient documentation

## 2011-08-01 LAB — URINALYSIS, ROUTINE W REFLEX MICROSCOPIC
Bilirubin Urine: NEGATIVE
Protein, ur: NEGATIVE mg/dL
Urobilinogen, UA: 1 mg/dL (ref 0.0–1.0)

## 2011-08-01 LAB — URINE MICROSCOPIC-ADD ON

## 2011-08-01 MED ORDER — DIPHENHYDRAMINE HCL 50 MG/ML IJ SOLN
50.0000 mg | Freq: Once | INTRAMUSCULAR | Status: AC
  Administered 2011-08-01: 50 mg via INTRAMUSCULAR
  Filled 2011-08-01: qty 1

## 2011-08-01 MED ORDER — METOCLOPRAMIDE HCL 5 MG/ML IJ SOLN
10.0000 mg | Freq: Once | INTRAMUSCULAR | Status: AC
  Administered 2011-08-01: 10 mg via INTRAMUSCULAR
  Filled 2011-08-01: qty 2

## 2011-08-01 MED ORDER — CEPHALEXIN 500 MG PO CAPS: 500.0000 mg | ORAL_CAPSULE | Freq: Two times a day (BID) | ORAL | Status: AC

## 2011-08-01 MED ORDER — SODIUM CHLORIDE 0.9 % IV BOLUS (SEPSIS): 1000.0000 mL | Freq: Once | INTRAVENOUS | Status: DC

## 2011-08-01 MED ORDER — PROMETHAZINE HCL 12.5 MG PO TABS
25.0000 mg | ORAL_TABLET | Freq: Once | ORAL | Status: AC
  Administered 2011-08-01: 25 mg via ORAL
  Filled 2011-08-01: qty 2

## 2011-08-01 MED ORDER — HYDROMORPHONE HCL PF 1 MG/ML IJ SOLN: 1.0000 mg | Freq: Once | INTRAMUSCULAR | Status: DC

## 2011-08-01 MED ORDER — HYDROMORPHONE HCL PF 1 MG/ML IJ SOLN
1.0000 mg | Freq: Once | INTRAMUSCULAR | Status: AC
  Administered 2011-08-01: 1 mg via INTRAMUSCULAR
  Filled 2011-08-01: qty 1

## 2011-08-01 MED ORDER — LIDOCAINE HCL (PF) 1 % IJ SOLN
INTRAMUSCULAR | Status: AC
  Administered 2011-08-01: 2.1 mL
  Filled 2011-08-01: qty 5

## 2011-08-01 MED ORDER — METOCLOPRAMIDE HCL 5 MG/ML IJ SOLN: 10.0000 mg | Freq: Once | INTRAMUSCULAR | Status: DC

## 2011-08-01 MED ORDER — DEXTROSE 5 % IV SOLN: 1.0000 g | Freq: Once | INTRAVENOUS | Status: DC

## 2011-08-01 MED ORDER — PROMETHAZINE HCL 25 MG RE SUPP: 25.0000 mg | Freq: Four times a day (QID) | RECTAL | Status: DC | PRN

## 2011-08-01 MED ORDER — CEFTRIAXONE SODIUM 1 G IJ SOLR
1.0000 g | Freq: Once | INTRAMUSCULAR | Status: AC
  Administered 2011-08-01: 1 g via INTRAMUSCULAR
  Filled 2011-08-01: qty 10

## 2011-08-01 NOTE — Discharge Instructions (Signed)

## 2011-08-01 NOTE — ED Notes (Signed)
Tolerated gingerale with no vomiting

## 2011-08-01 NOTE — ED Notes (Signed)
Complain of vomiting and unable to eat

## 2011-08-01 NOTE — ED Notes (Signed)
Attempted to sit pt on side of bed and pt states, " Im throwing up" pt spitting in basin. No vomit or bile seen in basin.

## 2011-08-01 NOTE — ED Notes (Signed)
Ginger ale given to pt.  

## 2011-08-01 NOTE — ED Notes (Signed)
Attempted for IV site by two RN with no success. Dr. Bebe Shaggy aware.

## 2011-08-01 NOTE — ED Provider Notes (Signed)
History   This chart was scribed for Joya Gaskins, MD by Melba Coon. The patient was seen in room APA19/APA19 and the patient's care was started at 5:16PM.    CSN: [id number]  Arrival date & time 08/01/11  1545   First MD Initiated Contact with Patient 08/01/11 1705      Chief Complaint  Patient presents with  . Emesis     HPI [name] is a 26 y.o. female who presents to the Emergency Department complaining of persistent, moderate to severe emesis with an onset today. Pt is unable to give Hx due to severity of pain. Hx provided by family (father). Pt has had Hx of abd problems/pain that gotten progressively worse in the past day Nothing improves her symptoms  No HA, fever, neck pain, sore throat, rash, back pain, CP, SOB, dysuria, or extremity pain, edema, weakness, numbness, or tingling.   She reports recent admission for pain but reports no improvement and symptoms similar to when she was discharged  Past Medical History  Diagnosis Date  . Ovarian cyst   . Kidney stones   . UTI (lower urinary tract infection)   . Calcium oxalate renal stones   . Asthma     Past Surgical History  Procedure Date  . Ovarian cyst removal   . Lithotripsy   . Laparoscopic tubal ligation   . Cholecystectomy   . Appendectomy     No family history on file.  History  Substance Use Topics  . Smoking status: Current Everyday Smoker -- 0.5 packs/day  . Smokeless tobacco: Not on file  . Alcohol Use: No    OB History    Grav Para Term Preterm Abortions TAB SAB Ect Mult Living                  Review of Systems  Constitutional: Negative for fever.  Gastrointestinal: Positive for vomiting and abdominal pain.  Genitourinary: Positive for dysuria.  All other systems reviewed and are negative.     Allergies  Iohexol; Zofran; Ketorolac tromethamine; and Morphine and related  Home Medications   Current Outpatient Rx  Name Route Sig Dispense Refill  . ACETAMINOPHEN  325 MG PO TABS Oral Take 2 tablets (650 mg total) by mouth every 6 (six) hours as needed (or Fever >/= 101).    . IBUPROFEN 200 MG PO TABS Oral Take 800 mg by mouth every 6 (six) hours as needed. Pain    . PROMETHAZINE HCL 25 MG PO TABS Oral Take 1 tablet (25 mg total) by mouth every 6 (six) hours as needed. 15 tablet 0  . SULFAMETHOXAZOLE-TMP DS 800-160 MG PO TABS Oral Take 1 tablet by mouth every 12 (twelve) hours. 16 tablet 0  . TOPIRAMATE 50 MG PO TABS Oral Take 50 mg by mouth 2 (two) times daily.      Marland Kitchen PROMETHAZINE HCL 25 MG PO TABS Oral Take 1 tablet (25 mg total) by mouth every 6 (six) hours as needed for nausea. 12 tablet 0  . PROMETHAZINE HCL 25 MG PO TABS Oral Take 1 tablet (25 mg total) by mouth 3 (three) times daily as needed for nausea. 30 tablet 0  . PROMETHAZINE HCL 25 MG PO TABS Oral Take 1 tablet (25 mg total) by mouth every 6 (six) hours as needed for nausea. 12 tablet 0  . PROMETHAZINE HCL 25 MG PO TABS Oral Take 1 tablet (25 mg total) by mouth every 6 (six) hours as needed for nausea. 12  tablet 0  . PROMETHAZINE HCL 25 MG PO TABS Oral Take 0.5 tablets (12.5 mg total) by mouth every 6 (six) hours as needed for nausea. 20 tablet 0    BP 136/100  Pulse 97  Temp 98.3 F (36.8 C) (Oral)  Resp 26  Ht 5\' 4"  (1.626 m)  Wt 206 lb (93.441 kg)  BMI 35.36 kg/m2  SpO2 98%  Physical Exam CONSTITUTIONAL: Well developed/well nourished; anxious HEAD AND FACE: Normocephalic/atraumatic EYES: EOMI/PERRL ENMT: Mucous membranes moist NECK: supple no meningeal signs SPINE:entire spine nontender CV: S1/S2 noted, no murmurs/rubs/gallops noted LUNGS: Lungs are clear to auscultation bilaterally, no apparent distress ABDOMEN: soft, tenderness in right mid abdomen, no rebound or guarding, tenderness is moderate, +BS UJ:WJXBJ cva tenderness NEURO: Pt is awake/alert, moves all extremitiesx4 EXTREMITIES: pulses normal, full ROM SKIN: warm, color normal PSYCH: anxious  ED Course    Procedures (including critical care time)  DIAGNOSTIC STUDIES: Oxygen Saturation is 100% on room air, normal by my interpretation.    COORDINATION OF CARE:  5:20PM - EDMD will order UA for the pt and review medical records. Pt with long h/o abdominal pain with frequent ED evaluations including up to 20 CT scans.  She presents today for recurrent abdominal pain and vomiting.  She reports she has been vomiting but has been able to take some of her bactrim.  She reports abd pain similar to prior.  No fever reported.  uti noted here.  She had recent admission for acute pyelo (pt was febrile) but no fever here.  See note below from urologist on last admission which was copied from chart:  "This 26 year old female is well known to me. I had seen her in [place]  a few weeks ago with the same problem and she underwent complete workup  including cystoscopy, retrograde pyelogram, CT abdomen and pelvis. I  was unable to find any pathology and explain this pain and then she went  to [place], where they have inserted a double-J stent on that side, but  she is still having pain. She was supposed to come back for follow up  in the office. She did not show up in the office. She says she was  having pain so she went to [place]. "  At this point, her abdominal pain is a chronic process and I doubt acute abdominal emergency.  She does have recurrent uti.  She did have some vomiting intially but given reglan and able to tolerate PO.  She spits frequently but is not actively vomiting liquid.  She is nontoxic, vitals appropriate.  She apparently still has urologic stent,but given afebrile/nontoxic can f/u as outpatient.  I do not feel admission is warranted at this time.  I have prescribed rectal phenergen, keflex for recurrent uti and sent urine culture.     Labs Reviewed  URINALYSIS, ROUTINE W REFLEX MICROSCOPIC - Abnormal; Notable for the following:    APPearance CLOUDY (*)     Hgb urine dipstick MODERATE  (*)     Nitrite POSITIVE (*)     Leukocytes, UA MODERATE (*)     All other components within normal limits  URINE MICROSCOPIC-ADD ON - Abnormal; Notable for the following:    Squamous Epithelial / LPF MANY (*)     Bacteria, UA MANY (*)     All other components within normal limits  POCT PREGNANCY, URINE  BASIC METABOLIC PANEL  CBC  DIFFERENTIAL      MDM  Nursing notes including past medical history and  social history reviewed and considered in documentation All labs/vitals reviewed and considered Previous records reviewed and considered - recent admission for pyelonephritis     I personally performed the services described in this documentation, which was scribed in my presence. The recorded information has been reviewed and considered.          Joya Gaskins, MD 08/01/11 2106

## 2011-08-04 ENCOUNTER — Ambulatory Visit: Payer: Medicaid Other | Admitting: Urology

## 2011-08-04 LAB — URINE CULTURE
Colony Count: >=100000
Culture  Setup Time: 201306232041

## 2011-09-29 ENCOUNTER — Emergency Department (HOSPITAL_COMMUNITY)
Admission: EM | Admit: 2011-09-29 | Discharge: 2011-09-29 | Disposition: A | Discharge status: Home / Self Care | Payer: Medicaid Other | Attending: Emergency Medicine | Admitting: Emergency Medicine

## 2011-09-29 ENCOUNTER — Emergency Department (HOSPITAL_COMMUNITY): Payer: Medicaid Other

## 2011-09-29 ENCOUNTER — Encounter (HOSPITAL_COMMUNITY): Payer: Self-pay | Admitting: Emergency Medicine

## 2011-09-29 DIAGNOSIS — Z9089 Acquired absence of other organs: Secondary | ICD-10-CM | POA: Insufficient documentation

## 2011-09-29 DIAGNOSIS — F17200 Nicotine dependence, unspecified, uncomplicated: Secondary | ICD-10-CM | POA: Insufficient documentation

## 2011-09-29 DIAGNOSIS — N10 Acute tubulo-interstitial nephritis: Secondary | ICD-10-CM

## 2011-09-29 DIAGNOSIS — J45909 Unspecified asthma, uncomplicated: Secondary | ICD-10-CM | POA: Insufficient documentation

## 2011-09-29 DIAGNOSIS — N200 Calculus of kidney: Secondary | ICD-10-CM

## 2011-09-29 LAB — CBC WITH DIFFERENTIAL/PLATELET
Eosinophils Relative: 2 % (ref 0–5)
HCT: 39.8 % (ref 36.0–46.0)
Hemoglobin: 13.1 g/dL (ref 12.0–15.0)
Lymphocytes Relative: 45 % (ref 12–46)
Lymphs Abs: 2.3 10*3/uL (ref 0.7–4.0)
MCV: 90 fL (ref 78.0–100.0)
Monocytes Absolute: 0.3 10*3/uL (ref 0.1–1.0)
Monocytes Relative: 6 % (ref 3–12)
RBC: 4.42 MIL/uL (ref 3.87–5.11)
WBC: 5 10*3/uL (ref 4.0–10.5)

## 2011-09-29 LAB — BASIC METABOLIC PANEL
CO2: 20 mEq/L (ref 19–32)
Chloride: 106 mEq/L (ref 96–112)
Creatinine, Ser: 0.67 mg/dL (ref 0.50–1.10)
Potassium: 4.1 mEq/L (ref 3.5–5.1)
Sodium: 135 mEq/L (ref 135–145)

## 2011-09-29 LAB — URINALYSIS, ROUTINE W REFLEX MICROSCOPIC
Glucose, UA: NEGATIVE mg/dL
Protein, ur: NEGATIVE mg/dL

## 2011-09-29 LAB — PREGNANCY, URINE: Preg Test, Ur: NEGATIVE

## 2011-09-29 LAB — URINE MICROSCOPIC-ADD ON

## 2011-09-29 MED ORDER — PROMETHAZINE HCL 25 MG/ML IJ SOLN
12.5000 mg | Freq: Once | INTRAMUSCULAR | Status: AC
  Administered 2011-09-29: 12.5 mg via INTRAVENOUS
  Filled 2011-09-29: qty 1

## 2011-09-29 MED ORDER — OXYCODONE-ACETAMINOPHEN 5-325 MG PO TABS
1.0000 | ORAL_TABLET | Freq: Once | ORAL | Status: AC
  Administered 2011-09-29: 1 via ORAL
  Filled 2011-09-29: qty 1

## 2011-09-29 MED ORDER — DEXTROSE 5 % IV SOLN
1.0000 g | Freq: Once | INTRAVENOUS | Status: AC
  Administered 2011-09-29: 1 g via INTRAVENOUS
  Filled 2011-09-29: qty 10

## 2011-09-29 MED ORDER — CIPROFLOXACIN HCL 500 MG PO TABS: 500.0000 mg | ORAL_TABLET | Freq: Two times a day (BID) | ORAL | Status: AC

## 2011-09-29 MED ORDER — OXYCODONE-ACETAMINOPHEN 5-325 MG PO TABS: 1.0000 | ORAL_TABLET | ORAL | Status: AC | PRN

## 2011-09-29 MED ORDER — HYDROMORPHONE HCL PF 1 MG/ML IJ SOLN
1.0000 mg | Freq: Once | INTRAMUSCULAR | Status: AC
  Administered 2011-09-29: 1 mg via INTRAVENOUS
  Filled 2011-09-29: qty 1

## 2011-09-29 MED ORDER — PROMETHAZINE HCL 25 MG PO TABS: 25.0000 mg | ORAL_TABLET | Freq: Four times a day (QID) | ORAL | Status: AC | PRN

## 2011-09-29 MED ORDER — SODIUM CHLORIDE 0.9 % IV BOLUS (SEPSIS)
1000.0000 mL | Freq: Once | INTRAVENOUS | Status: AC
  Administered 2011-09-29: 1000 mL via INTRAVENOUS

## 2011-09-29 NOTE — ED Notes (Signed)
Pt c/o abd pain with vomiting since Sunday.

## 2011-09-29 NOTE — ED Notes (Signed)
PA at bedside.

## 2011-09-29 NOTE — ED Provider Notes (Signed)
History     CSN: [id number]  Arrival date & time 09/29/11  4098   First MD Initiated Contact with Patient 09/29/11 [phone]      Chief Complaint  Patient presents with  . Abdominal Pain  . Emesis    (Consider location/radiation/quality/duration/timing/severity/associated sxs/prior treatment) HPI Comments: [name] presents with a 2 day history of right sided  flank pain associated with painful and frequent urination,  Nausea and uncontrolled vomiting along with subjective fever and chills. Her pain is constant,  Sharp and radiates into her lower back and right lower quadrant.  She denies hematuria but has increased frequency of small amounts of urine.   She does have a history of nephrolithiasis,  Most recently was admitted in June 2013 for similar symptoms and was thought to have a pyelonephritis along with renal stones, and a probable infected renal stent which has since been removed by a urologist in [place],  Where it was originally placed.  She denies headache, throat pain, rash, chest pain,  Shortness of breath and diarrhea or constipation.  Past surgical history is significant for appendectomy and cholecystectomy.  Patient is a 26 y.o. female presenting with vomiting. The history is provided by the patient.  Emesis  Pertinent negatives include no abdominal pain, no arthralgias, no diarrhea, no fever and no headaches.    Past Medical History  Diagnosis Date  . Ovarian cyst   . Kidney stones   . UTI (lower urinary tract infection)   . Calcium oxalate renal stones   . Asthma     Past Surgical History  Procedure Date  . Ovarian cyst removal   . Lithotripsy   . Laparoscopic tubal ligation   . Cholecystectomy   . Appendectomy     History reviewed. No pertinent family history.  History  Substance Use Topics  . Smoking status: Current Everyday Smoker -- 0.5 packs/day  . Smokeless tobacco: Not on file  . Alcohol Use: No    OB History    Grav Para Term Preterm  Abortions TAB SAB Ect Mult Living                  Review of Systems  Constitutional: Negative for fever.  HENT: Negative for congestion, sore throat and neck pain.   Eyes: Negative.   Respiratory: Negative for chest tightness and shortness of breath.   Cardiovascular: Negative for chest pain.  Gastrointestinal: Positive for nausea and vomiting. Negative for abdominal pain, diarrhea, constipation and abdominal distention.  Genitourinary: Positive for dysuria, urgency and flank pain.  Musculoskeletal: Negative for joint swelling and arthralgias.  Skin: Negative.  Negative for rash and wound.  Neurological: Negative for dizziness, weakness, light-headedness, numbness and headaches.  Hematological: Negative.   Psychiatric/Behavioral: Negative.     Allergies  Iohexol; Zofran; Ketorolac tromethamine; and Morphine and related  Home Medications   Current Outpatient Rx  Name Route Sig Dispense Refill  . ACETAMINOPHEN 500 MG PO TABS Oral Take 1,500 mg by mouth every 6 (six) hours as needed. Pain    . CIPROFLOXACIN HCL 500 MG PO TABS Oral Take 1 tablet (500 mg total) by mouth 2 (two) times daily. 20 tablet 0  . OXYCODONE-ACETAMINOPHEN 5-325 MG PO TABS Oral Take 1 tablet by mouth every 4 (four) hours as needed for pain. 20 tablet 0  . PROMETHAZINE HCL 25 MG PO TABS Oral Take 1 tablet (25 mg total) by mouth every 6 (six) hours as needed for nausea. 15 tablet 0  BP 119/78  Pulse 68  Temp 99 F (37.2 C) (Oral)  Resp 18  Ht 5\' 4"  (1.626 m)  Wt 207 lb (93.895 kg)  BMI 35.53 kg/m2  SpO2 100%  LMP 09/07/2011  Physical Exam  Nursing note and vitals reviewed. Constitutional: She appears well-developed and well-nourished.  HENT:  Head: Normocephalic and atraumatic.  Eyes: Conjunctivae are normal.  Neck: Normal range of motion.  Cardiovascular: Normal rate, regular rhythm, normal heart sounds and intact distal pulses.   Pulmonary/Chest: Effort normal and breath sounds normal. She has  no wheezes.  Abdominal: Soft. Bowel sounds are normal. She exhibits no distension. There is no tenderness. There is CVA tenderness. There is no rebound and no guarding.       Right cva ttp.  Musculoskeletal: Normal range of motion.  Neurological: She is alert.  Skin: Skin is warm and dry.  Psychiatric: She has a normal mood and affect.    ED Course  Procedures (including critical care time)  Labs Reviewed  URINALYSIS, ROUTINE W REFLEX MICROSCOPIC - Abnormal; Notable for the following:    Hgb urine dipstick SMALL (*)     Nitrite POSITIVE (*)     Leukocytes, UA MODERATE (*)     All other components within normal limits  BASIC METABOLIC PANEL - Abnormal; Notable for the following:    Glucose, Bld 103 (*)     All other components within normal limits  URINE MICROSCOPIC-ADD ON - Abnormal; Notable for the following:    Bacteria, UA MANY (*)     All other components within normal limits  PREGNANCY, URINE  CBC WITH DIFFERENTIAL  URINE CULTURE   US Renal  09/29/2011  *RADIOLOGY REPORT*  Clinical Data: Right-sided pain with a history of kidney stones. Nausea.  Emesis.  RENAL/URINARY TRACT ULTRASOUND COMPLETE  Comparison:  07/21/2011.  CT of 07/01/2011.  Findings:  Right Kidney:  11.3 cm. No hydronephrosis.  Tiny echogenic foci likely correspond to  collecting systems stones on prior CT.  Left Kidney:  11.0 cm. No hydronephrosis.  Tiny echogenic foci likely correspond to stones on CT.  Bladder:  Within normal limits.  Bilateral ureteric jets identified.  IMPRESSION:  1.  Bilateral renal calculi, better visualized on recent CT. 2. No hydronephrosis.   Original Report Authenticated By: Consuello Bossier, M.D.      1. Pyelonephritis, acute   2. Nephrolithiasis       MDM  Pt tolerated PO fluids in ed.  She received IV rocephin 1 gram prior to dc home.  Urine culture pending.  Prescribed cipro,  Phenergan, percocet.  Encouraged to rest, increase fluid intake.  Call Dr Rito Ehrlich for recheck within  the next 4-5 days,  Sooner for worsened pain, uncontrolled emesis, or if develops fever.  Return here if sx worsen and unable to rechecked as outpt.  Labs and [place]  Reviewed prior to dc home.        Burgess Amor, [place] 09/29/11 [phone]

## 2011-09-30 LAB — URINE CULTURE: Colony Count: >=100000

## 2011-09-30 NOTE — ED Provider Notes (Signed)
Medical screening examination/treatment/procedure(s) were performed by non-physician practitioner and as supervising physician I was immediately available for consultation/collaboration.   Glynn Octave, MD 09/30/11 1121

## 2011-10-18 ENCOUNTER — Encounter (HOSPITAL_COMMUNITY): Payer: Self-pay | Admitting: *Deleted

## 2011-10-18 ENCOUNTER — Emergency Department (HOSPITAL_COMMUNITY)
Admission: EM | Admit: 2011-10-18 | Discharge: 2011-10-18 | Disposition: A | Discharge status: Home / Self Care | Payer: Medicaid Other | Attending: Emergency Medicine | Admitting: Emergency Medicine

## 2011-10-18 DIAGNOSIS — F17200 Nicotine dependence, unspecified, uncomplicated: Secondary | ICD-10-CM | POA: Insufficient documentation

## 2011-10-18 DIAGNOSIS — J45909 Unspecified asthma, uncomplicated: Secondary | ICD-10-CM | POA: Insufficient documentation

## 2011-10-18 DIAGNOSIS — N390 Urinary tract infection, site not specified: Secondary | ICD-10-CM | POA: Insufficient documentation

## 2011-10-18 DIAGNOSIS — Z885 Allergy status to narcotic agent status: Secondary | ICD-10-CM | POA: Insufficient documentation

## 2011-10-18 DIAGNOSIS — R109 Unspecified abdominal pain: Secondary | ICD-10-CM | POA: Insufficient documentation

## 2011-10-18 LAB — URINALYSIS, ROUTINE W REFLEX MICROSCOPIC
Glucose, UA: NEGATIVE mg/dL
Protein, ur: NEGATIVE mg/dL
Specific Gravity, Urine: 1.015 (ref 1.005–1.030)
pH: 7 (ref 5.0–8.0)

## 2011-10-18 LAB — URINE MICROSCOPIC-ADD ON

## 2011-10-18 LAB — PREGNANCY, URINE: Preg Test, Ur: NEGATIVE

## 2011-10-18 MED ORDER — ONDANSETRON HCL 4 MG PO TABS
4.0000 mg | ORAL_TABLET | Freq: Once | ORAL | Status: DC
  Filled 2011-10-18: qty 1

## 2011-10-18 MED ORDER — PHENAZOPYRIDINE HCL 200 MG PO TABS: 200.0000 mg | ORAL_TABLET | Freq: Three times a day (TID) | ORAL | Status: AC

## 2011-10-18 MED ORDER — CIPROFLOXACIN HCL 500 MG PO TABS: 500.0000 mg | ORAL_TABLET | Freq: Two times a day (BID) | ORAL | Status: AC

## 2011-10-18 MED ORDER — PHENAZOPYRIDINE HCL 100 MG PO TABS
200.0000 mg | ORAL_TABLET | Freq: Once | ORAL | Status: AC
  Administered 2011-10-18: 200 mg via ORAL
  Filled 2011-10-18: qty 2

## 2011-10-18 MED ORDER — OXYCODONE-ACETAMINOPHEN 5-325 MG PO TABS
1.0000 | ORAL_TABLET | Freq: Once | ORAL | Status: AC
  Administered 2011-10-18: 1 via ORAL
  Filled 2011-10-18: qty 1

## 2011-10-18 MED ORDER — CIPROFLOXACIN HCL 250 MG PO TABS
500.0000 mg | ORAL_TABLET | Freq: Once | ORAL | Status: AC
  Administered 2011-10-18: 500 mg via ORAL
  Filled 2011-10-18: qty 2

## 2011-10-18 NOTE — ED Notes (Signed)
Pt discharged. Pt stable at time of discharge. Medications reviewed pt has no questions regarding discharge at this time. Pt voiced understanding of discharge instructions.  

## 2011-10-18 NOTE — ED Notes (Signed)
Pt reports the lower part of her abdomen hurts. Denies any burning w/ urination or vaginal discharge.

## 2011-10-19 NOTE — ED Provider Notes (Signed)
History     CSN: [id number]  Arrival date & time 10/18/11  0330   First MD Initiated Contact with Patient 10/18/11 [phone]      Chief Complaint  Patient presents with  . Abdominal Pain    (Consider location/radiation/quality/duration/timing/severity/associated sxs/prior treatment) HPI  [name] is a 26 y.o. 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 She was recently treated for a pyelonephritis. Denies dysuria, fever, diarrhea.   Past Medical History  Diagnosis Date  . Ovarian cyst   . Kidney stones   . UTI (lower urinary tract infection)   . Calcium oxalate renal stones   . Asthma     Past Surgical History  Procedure Date  . Ovarian cyst removal   . Lithotripsy   . Laparoscopic tubal ligation   . Cholecystectomy   . Appendectomy     No family history on file.  History  Substance Use Topics  . Smoking status: Current Everyday Smoker -- 0.5 packs/day  . Smokeless tobacco: Not on file  . Alcohol Use: No    OB History    Grav Para Term Preterm Abortions TAB SAB Ect Mult Living                  Review of Systems  Constitutional: Negative for fever.       10 Systems reviewed and are negative for acute change except as noted in the HPI.  HENT: Negative for congestion.   Eyes: Negative for discharge and redness.  Respiratory: Negative for cough and shortness of breath.   Cardiovascular: Negative for chest pain.  Gastrointestinal: Positive for abdominal pain. Negative for vomiting.  Genitourinary: Positive for decreased urine volume.  Musculoskeletal: Negative for back pain.  Skin: Negative for rash.  Neurological: Negative for syncope, numbness and headaches.  Psychiatric/Behavioral:       No behavior change.    Allergies  Iohexol; Zofran;  Ketorolac tromethamine; and Morphine and related  Home Medications   Current Outpatient Rx  Name Route Sig Dispense Refill  . IBUPROFEN 800 MG PO TABS Oral Take 800 mg by mouth as needed.    . ACETAMINOPHEN 500 MG PO TABS Oral Take 1,500 mg by mouth every 6 (six) hours as needed. Pain    . CIPROFLOXACIN HCL 500 MG PO TABS Oral Take 1 tablet (500 mg total) by mouth 2 (two) times daily. 20 tablet 0  . PHENAZOPYRIDINE HCL 200 MG PO TABS Oral Take 1 tablet (200 mg total) by mouth 3 (three) times daily. 6 tablet 0    BP 151/103  Pulse 82  Temp 98.5 F (36.9 C) (Oral)  Resp 22  Ht 5\' 4"  (1.626 m)  Wt 233 lb (105.688 kg)  BMI 39.99 kg/m2  SpO2 100%  LMP 08/08/2011  Physical Exam  Nursing note and vitals reviewed. Constitutional: She is oriented to person, place, and time. She appears well-developed and well-nourished.       Awake, alert, nontoxic appearance, tearful.  HENT:  Head: Atraumatic.  Eyes: Right eye exhibits no discharge. Left eye exhibits no discharge.  Neck: Neck supple.  Cardiovascular: Normal heart sounds.   Pulmonary/Chest: Effort normal and breath sounds normal. She exhibits no tenderness.  Abdominal: Soft. There is tenderness. There is no rebound and no guarding.  Suprapubic tenderness to palpation.  Genitourinary:       No cva tenderness  Musculoskeletal: Normal range of motion. She exhibits no tenderness.       Baseline ROM, no obvious new focal weakness.  Neurological: She is alert and oriented to person, place, and time.       Mental status and motor strength appears baseline for patient and situation.  Skin: No rash noted.  Psychiatric: She has a normal mood and affect.    ED Course  Procedures (including critical care time)  Results for orders placed during the hospital encounter of 10/18/11  URINALYSIS, ROUTINE W REFLEX MICROSCOPIC      Component Value Range   Color, Urine YELLOW  YELLOW   APPearance CLEAR  CLEAR   Specific Gravity, Urine  1.015  1.005 - 1.030   pH 7.0  5.0 - 8.0   Glucose, UA NEGATIVE  NEGATIVE mg/dL   Hgb urine dipstick TRACE (*) NEGATIVE   Bilirubin Urine NEGATIVE  NEGATIVE   Ketones, ur NEGATIVE  NEGATIVE mg/dL   Protein, ur NEGATIVE  NEGATIVE mg/dL   Urobilinogen, UA 0.2  0.0 - 1.0 mg/dL   Nitrite NEGATIVE  NEGATIVE   Leukocytes, UA SMALL (*) NEGATIVE  PREGNANCY, URINE      Component Value Range   Preg Test, Ur NEGATIVE  NEGATIVE  URINE MICROSCOPIC-ADD ON      Component Value Range   Squamous Epithelial / LPF FEW (*) RARE   WBC, UA 11-20  <3 WBC/hpf   RBC / HPF 0-2  <3 RBC/hpf   Bacteria, UA FEW (*) RARE   Urine-Other CLUE CELLS PRESENT     No results found.   1. Abdominal pain   2. Urinary tract infection       MDM  Patient with chronic abdominal pain and recurrent UTI here with lower abdominal pain and evidence of continue urine infection. Given antibiotic, analgesic, pyridium. Patient to follow up with urology. Pt stable in ED with no significant deterioration in condition.The patient appears reasonably screened and/or stabilized for discharge and I doubt any other medical condition or other [facility] [place] requiring further screening, evaluation, or treatment in the ED at this time prior to discharge.  MDM Reviewed: nursing note, vitals and previous chart Reviewed previous: labs Interpretation: labs           Nicoletta Dress. Colon Branch, MD 10/19/11 [phone]

## 2011-10-20 DIAGNOSIS — R109 Unspecified abdominal pain: Secondary | ICD-10-CM | POA: Insufficient documentation

## 2011-10-20 DIAGNOSIS — N200 Calculus of kidney: Secondary | ICD-10-CM | POA: Insufficient documentation

## 2011-10-20 DIAGNOSIS — R112 Nausea with vomiting, unspecified: Secondary | ICD-10-CM | POA: Insufficient documentation

## 2011-10-20 DIAGNOSIS — N390 Urinary tract infection, site not specified: Secondary | ICD-10-CM | POA: Insufficient documentation

## 2011-10-21 ENCOUNTER — Encounter (HOSPITAL_COMMUNITY): Payer: Self-pay | Admitting: Emergency Medicine

## 2011-10-21 ENCOUNTER — Emergency Department (HOSPITAL_COMMUNITY)
Admission: EM | Admit: 2011-10-21 | Discharge: 2011-10-21 | Disposition: A | Discharge status: Home / Self Care | Payer: Medicaid Other | Attending: Emergency Medicine | Admitting: Emergency Medicine

## 2011-10-21 ENCOUNTER — Emergency Department (HOSPITAL_COMMUNITY): Payer: Medicaid Other

## 2011-10-21 DIAGNOSIS — R109 Unspecified abdominal pain: Secondary | ICD-10-CM

## 2011-10-21 DIAGNOSIS — N390 Urinary tract infection, site not specified: Secondary | ICD-10-CM

## 2011-10-21 LAB — CBC WITH DIFFERENTIAL/PLATELET
Eosinophils Relative: 0 % (ref 0–5)
HCT: 36 % (ref 36.0–46.0)
Lymphocytes Relative: 40 % (ref 12–46)
Lymphs Abs: 2.7 10*3/uL (ref 0.7–4.0)
MCV: 89.1 fL (ref 78.0–100.0)
Monocytes Absolute: 0.4 10*3/uL (ref 0.1–1.0)
Neutro Abs: 3.7 10*3/uL (ref 1.7–7.7)
Platelets: 271 10*3/uL (ref 150–400)
RBC: 4.04 MIL/uL (ref 3.87–5.11)
WBC: 6.9 10*3/uL (ref 4.0–10.5)

## 2011-10-21 LAB — URINALYSIS, ROUTINE W REFLEX MICROSCOPIC
Bilirubin Urine: NEGATIVE
Protein, ur: NEGATIVE mg/dL
Urobilinogen, UA: 0.2 mg/dL (ref 0.0–1.0)

## 2011-10-21 LAB — BASIC METABOLIC PANEL
CO2: 24 mEq/L (ref 19–32)
Calcium: 9.7 mg/dL (ref 8.4–10.5)
Chloride: 102 mEq/L (ref 96–112)
Glucose, Bld: 79 mg/dL (ref 70–99)
Sodium: 136 mEq/L (ref 135–145)

## 2011-10-21 MED ORDER — HYDROMORPHONE HCL PF 2 MG/ML IJ SOLN
INTRAMUSCULAR | Status: AC
  Administered 2011-10-21: 1 mg via INTRAVENOUS
  Filled 2011-10-21: qty 1

## 2011-10-21 MED ORDER — DEXTROSE 5 % IV SOLN
1.0000 g | Freq: Once | INTRAVENOUS | Status: AC
  Administered 2011-10-21: 1 g via INTRAVENOUS
  Filled 2011-10-21: qty 10

## 2011-10-21 MED ORDER — PROMETHAZINE HCL 25 MG/ML IJ SOLN
INTRAMUSCULAR | Status: AC
  Administered 2011-10-21: 01:00:00 via INTRAMUSCULAR
  Filled 2011-10-21: qty 1

## 2011-10-21 MED ORDER — HYDROMORPHONE HCL PF 2 MG/ML IJ SOLN
INTRAMUSCULAR | Status: AC
  Administered 2011-10-21: 1 mg
  Filled 2011-10-21: qty 1

## 2011-10-21 MED ORDER — CEFPODOXIME PROXETIL 200 MG PO TABS: 200.0000 mg | ORAL_TABLET | Freq: Two times a day (BID) | ORAL | Status: AC

## 2011-10-21 MED ORDER — HYDROCODONE-ACETAMINOPHEN 7.5-500 MG/15ML PO SOLN: 15.0000 mL | Freq: Four times a day (QID) | ORAL | Status: AC | PRN

## 2011-10-21 MED ORDER — HYDROMORPHONE HCL PF 2 MG/ML IJ SOLN
INTRAMUSCULAR | Status: AC
  Filled 2011-10-21: qty 1

## 2011-10-21 MED ORDER — PROMETHAZINE HCL 25 MG RE SUPP: 25.0000 mg | Freq: Four times a day (QID) | RECTAL | Status: AC | PRN

## 2011-10-21 MED ORDER — ONDANSETRON HCL 4 MG/2ML IJ SOLN
INTRAMUSCULAR | Status: AC
  Filled 2011-10-21: qty 2

## 2011-10-21 MED ORDER — METOCLOPRAMIDE HCL 5 MG/ML IJ SOLN
10.0000 mg | Freq: Once | INTRAMUSCULAR | Status: AC
  Administered 2011-10-21: 10 mg via INTRAVENOUS
  Filled 2011-10-21: qty 2

## 2011-10-21 MED ORDER — HYDROMORPHONE HCL PF 1 MG/ML IJ SOLN: 1.0000 mg | Freq: Once | INTRAMUSCULAR | Status: AC

## 2011-10-21 NOTE — ED Notes (Signed)
Patient c/o abdominal pain x 4-5 days; states nausea started on Thursday and diarrhea started today.

## 2011-10-21 NOTE — ED Notes (Signed)
Orders for medications given by MD see downtime order sheets

## 2011-10-21 NOTE — ED Notes (Signed)
Pt ambulatory leaving. Pt left with discharge instructions and prescriptions and verbalized understanding of instructions. Pt had no questions.

## 2011-10-21 NOTE — ED Notes (Signed)
Attempted IV access x 5 by 2 RNs unable to gain access;  Unable to draw labs also.MD made aware orders for IM injections received.

## 2011-10-21 NOTE — ED Provider Notes (Signed)
History     CSN: [id number]  Arrival date & time 10/20/11  2358   First MD Initiated Contact with Patient 10/21/11 0153      Chief Complaint  Patient presents with  . Abdominal Pain    (Consider location/radiation/quality/duration/timing/severity/associated sxs/prior treatment) HPI HX per Pt. R sided ABD pain radiates from flank to RLQ with h/o kidney stones, noticed pain first about 4 days ago and tonight became severe, stabbing pain with associated N/V.  No F/C, has h/o kidney stones requiring admit in the past.  No trauma, no diarrhea, no blood in emesis or stools.  Past Medical History  Diagnosis Date  . Ovarian cyst   . Kidney stones   . UTI (lower urinary tract infection)   . Calcium oxalate renal stones   . Asthma     Past Surgical History  Procedure Date  . Ovarian cyst removal   . Lithotripsy   . Laparoscopic tubal ligation   . Cholecystectomy   . Appendectomy     No family history on file.  History  Substance Use Topics  . Smoking status: Current Every Day Smoker -- 0.5 packs/day  . Smokeless tobacco: Not on file  . Alcohol Use: No    OB History    Grav Para Term Preterm Abortions TAB SAB Ect Mult Living                  Review of Systems  Constitutional: Negative for fever and chills.  HENT: Negative for neck pain and neck stiffness.   Eyes: Negative for pain.  Respiratory: Negative for shortness of breath.   Cardiovascular: Negative for chest pain.  Gastrointestinal: Positive for vomiting and abdominal pain.  Genitourinary: Negative for dysuria.  Skin: Negative for rash.  Neurological: Negative for headaches.  All other systems reviewed and are negative.    Allergies  Iohexol; Zofran; Ketorolac tromethamine; and Morphine and related  Home Medications   Current Outpatient Rx  Name Route Sig Dispense Refill  . ACETAMINOPHEN 500 MG PO TABS Oral Take 1,500 mg by mouth every 6 (six) hours as needed. Pain    . CIPROFLOXACIN HCL 500 MG PO  TABS Oral Take 1 tablet (500 mg total) by mouth 2 (two) times daily. 20 tablet 0  . IBUPROFEN 800 MG PO TABS Oral Take 800 mg by mouth as needed.    Marland Kitchen PHENAZOPYRIDINE HCL 200 MG PO TABS Oral Take 1 tablet (200 mg total) by mouth 3 (three) times daily. 6 tablet 0    BP 147/94  Pulse 106  Temp 98.2 F (36.8 C) (Oral)  Resp 20  Ht 5\' 4"  (1.626 m)  Wt 217 lb (98.431 kg)  BMI 37.25 kg/m2  SpO2 99%  LMP 08/08/2011  Physical Exam  Constitutional: She is oriented to person, place, and time. She appears well-developed and well-nourished.  HENT:  Head: Normocephalic and atraumatic.  Eyes: Conjunctivae normal and EOM are normal. Pupils are equal, round, and reactive to light.  Neck: Trachea normal. Neck supple. No thyromegaly present.  Cardiovascular: Normal rate, regular rhythm, S1 normal, S2 normal and normal pulses.     No systolic murmur is present   No diastolic murmur is present  Pulses:      Radial pulses are 2+ on the right side, and 2+ on the left side.  Pulmonary/Chest: Effort normal and breath sounds normal. She has no wheezes. She has no rhonchi. She has no rales. She exhibits no tenderness.  Abdominal: Soft. Normal appearance and bowel  sounds are normal. There is no CVA tenderness and negative Murphy's sign.       Tender over R flank and somewhat RLQ. No RUQ tenderness. No acute ABD  Musculoskeletal:       BLE:s Calves nontender, no cords or erythema, negative Homans sign  Neurological: She is alert and oriented to person, place, and time. She has normal strength. No cranial nerve deficit or sensory deficit. GCS eye subscore is 4. GCS verbal subscore is 5. GCS motor subscore is 6.  Skin: Skin is warm and dry. No rash noted. She is not diaphoretic.  Psychiatric: Her speech is normal.       Cooperative and appropriate    ED Course  Angiocath insertion Date/Time: 10/21/2011 12:30 AM Performed by: Sunnie Nielsen Authorized by: Sunnie Nielsen Consent: Verbal consent  obtained. Risks and benefits: risks, benefits and alternatives were discussed Consent given by: patient Patient understanding: patient states understanding of the procedure being performed Patient consent: the patient's understanding of the procedure matches consent given Procedure consent: procedure consent matches procedure scheduled Required items: required blood products, implants, devices, and special equipment available Patient identity confirmed: verbally with patient Time out: Immediately prior to procedure a "time out" was called to verify the correct patient, procedure, equipment, support staff and site/side marked as required. Preparation: Patient was prepped and draped in the usual sterile fashion. Patient tolerance: Patient tolerated the procedure well with no immediate complications. Comments: 20 G placed left EJ. Blood obtained and sent to lab, line flushed without difficulty.      Results for orders placed during the hospital encounter of 10/21/11  URINALYSIS, ROUTINE W REFLEX MICROSCOPIC      Component Value Range   Color, Urine YELLOW  YELLOW   APPearance CLEAR  CLEAR   Specific Gravity, Urine 1.025  1.005 - 1.030   pH 6.0  5.0 - 8.0   Glucose, UA NEGATIVE  NEGATIVE mg/dL   Hgb urine dipstick SMALL (*) NEGATIVE   Bilirubin Urine NEGATIVE  NEGATIVE   Ketones, ur NEGATIVE  NEGATIVE mg/dL   Protein, ur NEGATIVE  NEGATIVE mg/dL   Urobilinogen, UA 0.2  0.0 - 1.0 mg/dL   Nitrite POSITIVE (*) NEGATIVE   Leukocytes, UA TRACE (*) NEGATIVE  URINE MICROSCOPIC-ADD ON      Component Value Range   Squamous Epithelial / LPF RARE  RARE   WBC, UA 21-50  <3 WBC/hpf   RBC / HPF 7-10  <3 RBC/hpf   Bacteria, UA FEW (*) RARE  CBC WITH DIFFERENTIAL      Component Value Range   WBC 6.9  4.0 - 10.5 K/uL   RBC 4.04  3.87 - 5.11 MIL/uL   Hemoglobin 11.9 (*) 12.0 - 15.0 g/dL   HCT 16.1  09.6 - 04.5 %   MCV 89.1  78.0 - 100.0 fL   MCH 29.5  26.0 - 34.0 pg   MCHC 33.1  30.0 - 36.0  g/dL   RDW 40.9  81.1 - 91.4 %   Platelets 271  150 - 400 K/uL   Neutrophils Relative 54  43 - 77 %   Neutro Abs 3.7  1.7 - 7.7 K/uL   Lymphocytes Relative 40  12 - 46 %   Lymphs Abs 2.7  0.7 - 4.0 K/uL   Monocytes Relative 6  3 - 12 %   Monocytes Absolute 0.4  0.1 - 1.0 K/uL   Eosinophils Relative 0  0 - 5 %   Eosinophils Absolute 0.0  0.0 -  0.7 K/uL   Basophils Relative 0  0 - 1 %   Basophils Absolute 0.0  0.0 - 0.1 K/uL  BASIC METABOLIC PANEL      Component Value Range   Sodium 136  135 - 145 mEq/L   Potassium 3.7  3.5 - 5.1 mEq/L   Chloride 102  96 - 112 mEq/L   CO2 24  19 - 32 mEq/L   Glucose, Bld 79  70 - 99 mg/dL   BUN 7  6 - 23 mg/dL   Creatinine, Ser 1.61  0.50 - 1.10 mg/dL   Calcium 9.7  8.4 - 09.6 mg/dL   GFR calc non Af Amer >90  >90 mL/min   GFR calc Af Amer >90  >90 mL/min  PREGNANCY, URINE      Component Value Range   Preg Test, Ur NEGATIVE  NEGATIVE   Ct Abdomen Pelvis Wo Contrast  10/21/2011  *RADIOLOGY REPORT*  Clinical Data: Right flank pain.  CT ABDOMEN AND PELVIS WITHOUT CONTRAST  Technique:  Multidetector CT imaging of the abdomen and pelvis was performed following the standard protocol without intravenous contrast.  Comparison: 07/01/2011  Findings: And atelectasis in the lung bases.  Bilateral intrarenal calculi demonstrated with two stones visualized in the lower pole right kidney and multiple stones demonstrated in the mid and lower pole of the left kidney.  Largest stone measures about 3 mm.  No pyelocaliectasis or ureterectasis. No ureteral or bladder stones.  Stable calcified phleboliths in the pelvis.  No bladder wall thickening.  Surgical absence of the gallbladder.  Minimal pneumobilia consistent with postoperative change.  Unenhanced appearance of the liver, spleen, pancreas, adrenal glands, abdominal aorta, and retroperitoneal lymph nodes is unremarkable.  Stomach, small bowel, and colon are decompressed.  No free air or free fluid in the abdomen.   Broad-based umbilical hernia containing fat with slight bulging of bowel.  No proximal distension.  Pelvis:  Subcutaneous emphysema over the right gluteal region likely representing sites of injection.  The uterus and adnexal structures are not enlarged.  Bladder wall is not thickened. Surgical clips consistent with tubal ligations.  No free or loculated pelvic fluid collection.  Surgical clips at the cecum suggesting prior appendectomy.  Normal alignment of the lumbar vertebrae.  IMPRESSION: Multiple bilateral nonobstructing renal stones.  No ureteral stone or obstruction demonstrated.  Postoperative changes.  No acute process suggested.   Original Report Authenticated By: Marlon Pel, M.D.    RIGHT FLANK PAIN.   IVFs. IV Dilaudid. IM phenergan.   Serial evaluations for persistent pain. IV ABx for UTI  3:13 AM CT reviewed and on recheck, pain much improved. Possible passed stone. Plan RX pain med and Vantin. PCP follow up. Strict return precautions verbalized as understood.   PT has a Insurance underwriter that she prefers to follow up with. No indication for admit or further work up at this time.  MDM   R flank pain evaluated with UA, labs and CT scan.    Pain treated IV narcotics.   IVFs provided.   VS and nursing notes reviewed. initially tachycardic with HR normalized with pain control.   Old records reviewed - multiple ED visits for ABD pain, UTI, kidney infection.       Sunnie Nielsen, MD 10/21/11 [phone]

## 2011-10-21 NOTE — ED Notes (Signed)
Patient returned from CT scan; patient lying in bed moaning and crying.

## 2011-10-22 ENCOUNTER — Emergency Department (HOSPITAL_COMMUNITY): Payer: Medicaid Other

## 2011-10-22 ENCOUNTER — Emergency Department (HOSPITAL_COMMUNITY)
Admission: EM | Admit: 2011-10-22 | Discharge: 2011-10-22 | Disposition: A | Discharge status: Home / Self Care | Payer: Medicaid Other | Attending: Emergency Medicine | Admitting: Emergency Medicine

## 2011-10-22 ENCOUNTER — Encounter (HOSPITAL_COMMUNITY): Payer: Self-pay | Admitting: Emergency Medicine

## 2011-10-22 DIAGNOSIS — M549 Dorsalgia, unspecified: Secondary | ICD-10-CM | POA: Insufficient documentation

## 2011-10-22 DIAGNOSIS — N12 Tubulo-interstitial nephritis, not specified as acute or chronic: Secondary | ICD-10-CM

## 2011-10-22 DIAGNOSIS — N739 Female pelvic inflammatory disease, unspecified: Secondary | ICD-10-CM

## 2011-10-22 DIAGNOSIS — R10819 Abdominal tenderness, unspecified site: Secondary | ICD-10-CM | POA: Insufficient documentation

## 2011-10-22 DIAGNOSIS — R109 Unspecified abdominal pain: Secondary | ICD-10-CM | POA: Insufficient documentation

## 2011-10-22 DIAGNOSIS — R52 Pain, unspecified: Secondary | ICD-10-CM

## 2011-10-22 DIAGNOSIS — R000 Tachycardia, unspecified: Secondary | ICD-10-CM | POA: Insufficient documentation

## 2011-10-22 LAB — CBC WITH DIFFERENTIAL/PLATELET
Eosinophils Absolute: 0 10*3/uL (ref 0.0–0.7)
Eosinophils Relative: 1 % (ref 0–5)
HCT: 38.2 % (ref 36.0–46.0)
Lymphocytes Relative: 46 % (ref 12–46)
Lymphs Abs: 2.4 10*3/uL (ref 0.7–4.0)
MCH: 29.2 pg (ref 26.0–34.0)
MCV: 88.4 fL (ref 78.0–100.0)
Monocytes Absolute: 0.2 10*3/uL (ref 0.1–1.0)
Monocytes Relative: 4 % (ref 3–12)
Platelets: 286 10*3/uL (ref 150–400)
RBC: 4.32 MIL/uL (ref 3.87–5.11)
WBC: 5.1 10*3/uL (ref 4.0–10.5)

## 2011-10-22 LAB — URINALYSIS, ROUTINE W REFLEX MICROSCOPIC
Bilirubin Urine: NEGATIVE
Glucose, UA: NEGATIVE mg/dL
Ketones, ur: NEGATIVE mg/dL
Protein, ur: NEGATIVE mg/dL
pH: 6.5 (ref 5.0–8.0)

## 2011-10-22 LAB — BASIC METABOLIC PANEL
CO2: 24 mEq/L (ref 19–32)
Calcium: 9.7 mg/dL (ref 8.4–10.5)
Chloride: 102 mEq/L (ref 96–112)
Glucose, Bld: 86 mg/dL (ref 70–99)
Sodium: 136 mEq/L (ref 135–145)

## 2011-10-22 LAB — LACTIC ACID, PLASMA: Lactic Acid, Venous: 1.6 mmol/L (ref 0.5–2.2)

## 2011-10-22 LAB — WET PREP, GENITAL
Trich, Wet Prep: NONE SEEN
Yeast Wet Prep HPF POC: NONE SEEN

## 2011-10-22 LAB — URINE CULTURE: Colony Count: 90000

## 2011-10-22 LAB — URINE MICROSCOPIC-ADD ON

## 2011-10-22 MED ORDER — HYDROMORPHONE HCL PF 1 MG/ML IJ SOLN
1.0000 mg | Freq: Once | INTRAMUSCULAR | Status: AC
  Administered 2011-10-22: 1 mg via INTRAVENOUS
  Filled 2011-10-22: qty 1

## 2011-10-22 MED ORDER — SODIUM CHLORIDE 0.9 % IV SOLN
Freq: Once | INTRAVENOUS | Status: AC
  Administered 2011-10-22: 20:00:00 via INTRAVENOUS

## 2011-10-22 MED ORDER — SODIUM CHLORIDE 0.9 % IV BOLUS (SEPSIS)
1000.0000 mL | Freq: Once | INTRAVENOUS | Status: AC
  Administered 2011-10-22: 1000 mL via INTRAVENOUS

## 2011-10-22 MED ORDER — DOXYCYCLINE HYCLATE 100 MG PO CAPS: 100.0000 mg | ORAL_CAPSULE | Freq: Two times a day (BID) | ORAL | Status: AC

## 2011-10-22 MED ORDER — DEXTROSE 5 % IV SOLN
1.0000 g | Freq: Once | INTRAVENOUS | Status: AC
  Administered 2011-10-22: 1 g via INTRAVENOUS
  Filled 2011-10-22: qty 10

## 2011-10-22 MED ORDER — SODIUM CHLORIDE 0.9 % IV BOLUS (SEPSIS): 1000.0000 mL | Freq: Once | INTRAVENOUS | Status: DC

## 2011-10-22 MED ORDER — METOCLOPRAMIDE HCL 5 MG/ML IJ SOLN
10.0000 mg | Freq: Once | INTRAMUSCULAR | Status: AC
Start: 2011-10-22 — End: 2011-10-22
  Administered 2011-10-22: 10 mg via INTRAVENOUS
  Filled 2011-10-22: qty 2

## 2011-10-22 MED ORDER — HYDROMORPHONE HCL PF 1 MG/ML IJ SOLN
INTRAMUSCULAR | Status: AC
  Administered 2011-10-22: 1 mg via INTRAMUSCULAR
  Filled 2011-10-22: qty 1

## 2011-10-22 MED ORDER — DOXYCYCLINE HYCLATE 100 MG PO TABS
100.0000 mg | ORAL_TABLET | Freq: Once | ORAL | Status: AC
  Administered 2011-10-22: 100 mg via ORAL
  Filled 2011-10-22: qty 1

## 2011-10-22 NOTE — ED Notes (Signed)
Iv attempt x 2 unsuccessful

## 2011-10-22 NOTE — ED Notes (Signed)
Pt going to u/s at this time.

## 2011-10-22 NOTE — ED Provider Notes (Addendum)
History  This chart was scribed for Derwood Kaplan, MD by Ladona Ridgel Day. This patient was seen in room APA11/APA11 and the patient's care was started at 1643.   CSN: [id number]  Arrival date & time 10/22/11  1643   First MD Initiated Contact with Patient 10/22/11 1650      Chief Complaint  Patient presents with  . Abdominal Pain   The history is provided by the patient. No language interpreter was used.   [name] is a 26 y.o. female who presents to the Emergency Department complaining of constant severe worsening lower "contracting" abdominal pain. She was seen here yesterday for same episode and had abdominal CT scan showing two non obstructing kidney stones. She states abdominal pain originally began six days ago intermittently and states when she was here it felt like "contractions". She said at home, a warm bath was the only thing that alleviated her symptoms. She complains of nausea, urinary hesitancy, 5 emesis episodes with green bile contents but denies any current diarrhea or hematuria. She has had lithotripsy, ovarian cysts removed, appendectomy, and cholecystectomy.    Past Medical History  Diagnosis Date  . Ovarian cyst   . Kidney stones   . UTI (lower urinary tract infection)   . Calcium oxalate renal stones   . Asthma     Past Surgical History  Procedure Date  . Ovarian cyst removal   . Lithotripsy   . Laparoscopic tubal ligation   . Cholecystectomy   . Appendectomy     History reviewed. No pertinent family history.  History  Substance Use Topics  . Smoking status: Current Every Day Smoker -- 0.5 packs/day  . Smokeless tobacco: Not on file  . Alcohol Use: No    OB History    Grav Para Term Preterm Abortions TAB SAB Ect Mult Living                  Review of Systems  Constitutional: Negative for fever and chills.  HENT: Negative for congestion and rhinorrhea.   Respiratory: Negative for shortness of breath.   Cardiovascular: Negative for chest  pain.  Gastrointestinal: Positive for nausea, vomiting and abdominal pain (right flank and suprapubic ). Negative for diarrhea.  Genitourinary: Positive for dysuria and urgency. Negative for hematuria, vaginal bleeding and vaginal discharge.  Musculoskeletal: Positive for back pain (right flank pain).  Skin: Negative for color change.  Neurological: Negative for dizziness, syncope and weakness.  All other systems reviewed and are negative.    Allergies  Iohexol; Zofran; Ketorolac tromethamine; and Morphine and related  Home Medications   Current Outpatient Rx  Name Route Sig Dispense Refill  . ACETAMINOPHEN 500 MG PO TABS Oral Take 1,500 mg by mouth every 6 (six) hours as needed. Pain    . CEFPODOXIME PROXETIL 200 MG PO TABS Oral Take 1 tablet (200 mg total) by mouth 2 (two) times daily. 14 tablet 0  . CIPROFLOXACIN HCL 500 MG PO TABS Oral Take 1 tablet (500 mg total) by mouth 2 (two) times daily. 20 tablet 0  . HYDROCODONE-ACETAMINOPHEN 7.5-500 MG/15ML PO SOLN Oral Take 15 mLs by mouth every 6 (six) hours as needed for pain. 60 mL 0  . IBUPROFEN 800 MG PO TABS Oral Take 800 mg by mouth as needed.    Marland Kitchen PHENAZOPYRIDINE HCL 200 MG PO TABS Oral Take 1 tablet (200 mg total) by mouth 3 (three) times daily. 6 tablet 0  . PROMETHAZINE HCL 25 MG RE SUPP Rectal Place  1 suppository (25 mg total) rectally every 6 (six) hours as needed for nausea. 12 each 0    Triage Vitals: BP 151/108  Pulse 106  Temp 98.4 F (36.9 C) (Oral)  Resp 22  Ht 5\' 4"  (1.626 m)  Wt 217 lb (98.431 kg)  BMI 37.25 kg/m2  SpO2 98%  LMP 08/08/2011  Physical Exam  Nursing note and vitals reviewed. Constitutional: She is oriented to person, place, and time. She appears well-developed and well-nourished. She appears distressed.       Pt tearful on exam  HENT:  Head: Normocephalic and atraumatic.  Eyes: Conjunctivae normal and EOM are normal. Pupils are equal, round, and reactive to light.  Neck: Normal range of  motion. Neck supple. No tracheal deviation present.  Cardiovascular: Normal rate, regular rhythm, normal heart sounds and intact distal pulses.   No murmur heard.      Sinus tachycardia at 120 bpm  Pulmonary/Chest: Effort normal and breath sounds normal. No respiratory distress. She has no wheezes. She has no rales.  Abdominal: Soft. Bowel sounds are normal. She exhibits no distension. There is tenderness (suprapubic and right flank tenderness but no ruq, llq, luq, rlq, or ruq pain.). There is no rebound and no guarding.  Genitourinary: Vagina normal and uterus normal.       External exam - normal, no lesions Speculum exam: Pt has some white discharge, no blood Bimanual exam: Patient has no CMT, but significant adnexal tenderness and cervical os is closed  Musculoskeletal: Normal range of motion. She exhibits no edema and no tenderness.  Neurological: She is alert and oriented to person, place, and time.  Skin: Skin is warm and dry.  Psychiatric: She has a normal mood and affect. Her behavior is normal.    ED Course  Procedures (including critical care time) DIAGNOSTIC STUDIES: Oxygen Saturation is 98% on room air, normal by my interpretation.    COORDINATION OF CARE: At 515 PM Discussed treatment plan with patient which includes IV fluids, and blood work. Patient agrees.     Labs Reviewed - No data to display Ct Abdomen Pelvis Wo Contrast  10/21/2011  *RADIOLOGY REPORT*  Clinical Data: Right flank pain.  CT ABDOMEN AND PELVIS WITHOUT CONTRAST  Technique:  Multidetector CT imaging of the abdomen and pelvis was performed following the standard protocol without intravenous contrast.  Comparison: 07/01/2011  Findings: And atelectasis in the lung bases.  Bilateral intrarenal calculi demonstrated with two stones visualized in the lower pole right kidney and multiple stones demonstrated in the mid and lower pole of the left kidney.  Largest stone measures about 3 mm.  No pyelocaliectasis or  ureterectasis. No ureteral or bladder stones.  Stable calcified phleboliths in the pelvis.  No bladder wall thickening.  Surgical absence of the gallbladder.  Minimal pneumobilia consistent with postoperative change.  Unenhanced appearance of the liver, spleen, pancreas, adrenal glands, abdominal aorta, and retroperitoneal lymph nodes is unremarkable.  Stomach, small bowel, and colon are decompressed.  No free air or free fluid in the abdomen.  Broad-based umbilical hernia containing fat with slight bulging of bowel.  No proximal distension.  Pelvis:  Subcutaneous emphysema over the right gluteal region likely representing sites of injection.  The uterus and adnexal structures are not enlarged.  Bladder wall is not thickened. Surgical clips consistent with tubal ligations.  No free or loculated pelvic fluid collection.  Surgical clips at the cecum suggesting prior appendectomy.  Normal alignment of the lumbar vertebrae.  IMPRESSION: Multiple bilateral  nonobstructing renal stones.  No ureteral stone or obstruction demonstrated.  Postoperative changes.  No acute process suggested.   Original Report Authenticated By: Marlon Pel, M.D.      No diagnosis found.    MDM  DDx includes:  Intra abdominal abscess Peritonitis Appendicitis Hernia Pyelonephritis UTI/Cystitis  Ovarian cyst TOA PID STD  Pt comes in with cc of suprapubic pain. The pain has been persistent for the past few days, and now there is some nausea and emesis. Seen y'day - CT stone protocol negative - treated as a UTI and discharge. UA is + for infection, but the cultures for the past few visits have not been helpful.  No vaginal discharge - but we will get pelvic exam. No risk factors for STD at this time. She did have right sided flank pain - so we will treat as a pyelo if GU workup is negative. Hydration started.   8:11 PM Pelvis US is negative. Pelvic exam reveals some adnexal fullness and tenderness - right sided.  No CMT - but the tenderness was quite significant. Based on the exam, we will start doxy. Pt continues to state that she is not having unprotected intercourse, so we will not treat empirically for GC/Chlamydia.  Also, pt was informed that she will be admitted for pyelonephritis - she refuses. Wants to f/u with pcp. She is having occasional nausea, no emesis in the ED - and my only fear is that she will not be able to tolerate po meds. Will hydrate well before discharge.  Derwood Kaplan, MD 10/22/11 2014

## 2011-10-22 NOTE — ED Notes (Signed)
Pt c/o severe abd pain. Pt states the only thing that helps the pain is hot water. Pt was seen here for same last week per pt.

## 2011-10-24 LAB — GC/CHLAMYDIA PROBE AMP, GENITAL
Chlamydia, DNA Probe: NEGATIVE
GC Probe Amp, Genital: NEGATIVE

## 2011-11-02 ENCOUNTER — Emergency Department (HOSPITAL_COMMUNITY)
Admission: EM | Admit: 2011-11-02 | Discharge: 2011-11-02 | Disposition: A | Discharge status: Home / Self Care | Payer: Medicaid Other | Attending: Emergency Medicine | Admitting: Emergency Medicine

## 2011-11-02 ENCOUNTER — Encounter (HOSPITAL_COMMUNITY): Payer: Self-pay | Admitting: *Deleted

## 2011-11-02 DIAGNOSIS — F17200 Nicotine dependence, unspecified, uncomplicated: Secondary | ICD-10-CM | POA: Insufficient documentation

## 2011-11-02 DIAGNOSIS — R109 Unspecified abdominal pain: Secondary | ICD-10-CM | POA: Insufficient documentation

## 2011-11-02 MED ORDER — HYDROCODONE-ACETAMINOPHEN 5-325 MG PO TABS
1.0000 | ORAL_TABLET | Freq: Once | ORAL | Status: AC
  Administered 2011-11-02: 1 via ORAL

## 2011-11-02 MED ORDER — METOCLOPRAMIDE HCL 5 MG/ML IJ SOLN
10.0000 mg | Freq: Once | INTRAMUSCULAR | Status: DC
  Filled 2011-11-02: qty 2

## 2011-11-02 MED ORDER — HYDROCODONE-ACETAMINOPHEN 5-325 MG PO TABS: 1.0000 | ORAL_TABLET | ORAL | Status: AC | PRN

## 2011-11-02 MED ORDER — DIPHENHYDRAMINE HCL 25 MG PO CAPS
50.0000 mg | ORAL_CAPSULE | Freq: Once | ORAL | Status: AC
  Administered 2011-11-02: 50 mg via ORAL
  Filled 2011-11-02: qty 2

## 2011-11-02 MED ORDER — HYDROMORPHONE HCL PF 1 MG/ML IJ SOLN
1.0000 mg | Freq: Once | INTRAMUSCULAR | Status: DC
  Filled 2011-11-02: qty 1

## 2011-11-02 MED ORDER — HYDROMORPHONE HCL PF 1 MG/ML IJ SOLN
1.0000 mg | Freq: Once | INTRAMUSCULAR | Status: AC
  Administered 2011-11-02: 1 mg via INTRAVENOUS

## 2011-11-02 MED ORDER — METOCLOPRAMIDE HCL 5 MG/ML IJ SOLN
10.0000 mg | INTRAMUSCULAR | Status: AC
  Administered 2011-11-02: 10 mg via INTRAVENOUS

## 2011-11-02 MED ORDER — HYDROCODONE-ACETAMINOPHEN 5-325 MG PO TABS
ORAL_TABLET | ORAL | Status: AC
  Filled 2011-11-02: qty 1

## 2011-11-02 NOTE — ED Notes (Signed)
Dr Strand in to see pt 

## 2011-11-02 NOTE — ED Provider Notes (Signed)
History     CSN: [id number]  Arrival date & time 11/02/11  0100   First MD Initiated Contact with Patient 11/02/11 [phone]      Chief Complaint  Patient presents with  . Abdominal Pain  . Nausea  . Emesis  . Hematuria    (Consider location/radiation/quality/duration/timing/severity/associated sxs/prior treatment) HPI [name] is a 26 y.o. female who presents to the Emergency Department complaining of recurrent lower abdominal  Pain associated with hematuria, nausea, vomiting. Patient has had 10 visits to the ER since 04/2011 with similar complaints. She has a h/o kidney stones and recurrent UTIs.Tonight she c/o right lower quadrant abdominal pain associated with nausea and vomiting. Denies fever, chills, back pain, dysuria, difficulty urinating. Last pelvic US and CT abdomen and pelvis showed only stones that were non obstructing.   Past Medical History  Diagnosis Date  . Ovarian cyst   . Kidney stones   . UTI (lower urinary tract infection)   . Calcium oxalate renal stones   . Asthma     Past Surgical History  Procedure Date  . Ovarian cyst removal   . Lithotripsy   . Laparoscopic tubal ligation   . Cholecystectomy   . Appendectomy     History reviewed. No pertinent family history.  History  Substance Use Topics  . Smoking status: Current Every Day Smoker -- 0.5 packs/day  . Smokeless tobacco: Not on file  . Alcohol Use: No    OB History    Grav Para Term Preterm Abortions TAB SAB Ect Mult Living                  Review of Systems  Constitutional: Negative for fever.       10 Systems reviewed and are negative for acute change except as noted in the HPI.  HENT: Negative for congestion.   Eyes: Negative for discharge and redness.  Respiratory: Negative for cough and shortness of breath.   Cardiovascular: Negative for chest pain.  Gastrointestinal: Positive for abdominal pain. Negative for vomiting.  Genitourinary: Positive for hematuria.    Musculoskeletal: Negative for back pain.  Skin: Negative for rash.  Neurological: Negative for syncope, numbness and headaches.  Psychiatric/Behavioral:       No behavior change.    Allergies  Iohexol; Zofran; Ketorolac tromethamine; and Morphine and related  Home Medications   Current Outpatient Rx  Name Route Sig Dispense Refill  . ACETAMINOPHEN 500 MG PO TABS Oral Take 1,500 mg by mouth every 6 (six) hours as needed. Pain    . DOXYCYCLINE HYCLATE 100 MG PO CAPS Oral Take 1 capsule (100 mg total) by mouth 2 (two) times daily. 20 capsule 0  . HYDROCODONE-ACETAMINOPHEN 5-325 MG PO TABS Oral Take 1 tablet by mouth every 4 (four) hours as needed for pain. 10 tablet 0  . IBUPROFEN 800 MG PO TABS Oral Take 800 mg by mouth every 6 (six) hours as needed. pain      BP 137/101  Pulse 110  Temp 99.1 F (37.3 C)  Resp 22  Wt 217 lb (98.431 kg)  SpO2 99%  LMP 08/08/2011  Physical Exam  Nursing note and vitals reviewed. Constitutional:       Awake, alert, nontoxic appearance, tearful.  HENT:  Head: Atraumatic.  Eyes: Right eye exhibits no discharge. Left eye exhibits no discharge.  Neck: Neck supple.  Pulmonary/Chest: Effort normal. She exhibits no tenderness.  Abdominal: Soft. There is tenderness. There is no rebound and no guarding.  Suprapubic tenderness.  Genitourinary:       No cva tenderness  Musculoskeletal: She exhibits no tenderness.       Baseline ROM, no obvious new focal weakness.  Neurological:       Mental status and motor strength appears baseline for patient and situation.  Skin: No rash noted.  Psychiatric: She has a normal mood and affect.    ED Course  Procedures (including critical care time)  Labs Reviewed - No data to display No results found.   1. Abdominal pain       MDM  Patient with chronic abdominal pain here with same presentation of lower abdominal pain. States she was seen and told she had a ovarian cyst. Last [place] and CT here did  not show evidence of ovarian cyst. Gave copies of both reports to the patient. She was given IVF, analgesic, antiemetic, and benadryl with improvement. She is to follow up with both her OB and her urologist.Pt stable in ED with no significant deterioration in condition.The patient appears reasonably screened and/or stabilized for discharge and I doubt any other medical condition or other [facility] requiring further screening, evaluation, or treatment in the ED at this time prior to discharge.  MDM Reviewed: nursing note, vitals and previous chart Reviewed previous: labs, x-ray, ultrasound and CT scan           Nicoletta Dress. Colon Branch, MD 11/02/11 [phone]

## 2011-11-02 NOTE — ED Notes (Signed)
Pt c/o lower abdominal pain and blood in her urine. Pt has been told she has a cyst on her right ovary.

## 2011-11-14 ENCOUNTER — Encounter (HOSPITAL_COMMUNITY): Payer: Self-pay

## 2011-11-14 ENCOUNTER — Emergency Department (HOSPITAL_COMMUNITY)
Admission: EM | Admit: 2011-11-14 | Discharge: 2011-11-14 | Disposition: A | Discharge status: Home / Self Care | Payer: Medicaid Other | Attending: Emergency Medicine | Admitting: Emergency Medicine

## 2011-11-14 DIAGNOSIS — J029 Acute pharyngitis, unspecified: Secondary | ICD-10-CM

## 2011-11-14 DIAGNOSIS — Z9089 Acquired absence of other organs: Secondary | ICD-10-CM | POA: Insufficient documentation

## 2011-11-14 DIAGNOSIS — J069 Acute upper respiratory infection, unspecified: Secondary | ICD-10-CM | POA: Insufficient documentation

## 2011-11-14 DIAGNOSIS — J45909 Unspecified asthma, uncomplicated: Secondary | ICD-10-CM | POA: Insufficient documentation

## 2011-11-14 MED ORDER — HYDROCODONE-ACETAMINOPHEN 5-500 MG PO TABS: 1.0000 | ORAL_TABLET | ORAL | Status: DC | PRN

## 2011-11-14 MED ORDER — IBUPROFEN 800 MG PO TABS
800.0000 mg | ORAL_TABLET | Freq: Once | ORAL | Status: AC
  Administered 2011-11-14: 800 mg via ORAL
  Filled 2011-11-14: qty 1

## 2011-11-14 MED ORDER — AMOXICILLIN 500 MG PO CAPS: 500.0000 mg | ORAL_CAPSULE | Freq: Three times a day (TID) | ORAL | Status: DC

## 2011-11-14 MED ORDER — HYDROCODONE-ACETAMINOPHEN 5-325 MG PO TABS
2.0000 | ORAL_TABLET | Freq: Once | ORAL | Status: AC
  Administered 2011-11-14: 2 via ORAL
  Filled 2011-11-14: qty 2

## 2011-11-14 MED ORDER — IBUPROFEN 800 MG PO TABS: 800.0000 mg | ORAL_TABLET | Freq: Three times a day (TID) | ORAL | Status: DC

## 2011-11-14 MED ORDER — HYDROXYZINE HCL 25 MG PO TABS
25.0000 mg | ORAL_TABLET | Freq: Once | ORAL | Status: AC
  Administered 2011-11-14: 25 mg via ORAL
  Filled 2011-11-14: qty 1

## 2011-11-14 MED ORDER — PENICILLIN G BENZATHINE 1200000 UNIT/2ML IM SUSP
1.2000 10*6.[IU] | Freq: Once | INTRAMUSCULAR | Status: AC
  Administered 2011-11-14: 1.2 10*6.[IU] via INTRAMUSCULAR
  Filled 2011-11-14: qty 2

## 2011-11-14 NOTE — ED Provider Notes (Signed)
History     CSN: [id number]  Arrival date & time 11/14/11  1306   First MD Initiated Contact with Patient 11/14/11 1355      Chief Complaint  Patient presents with  . Sore Throat    (Consider location/radiation/quality/duration/timing/severity/associated sxs/prior treatment) HPI Comments: Patient presents to the emergency department with 2-3 days of sore throat, earache, headache, and generally not feeling well. The patient states she has had fever at home. She has taken Tylenol but continues to feel bad. The patient is able to swallow liquids. No reported rash. She porch the pain is being no worse and causing her to have ear pain as well.  The history is provided by the patient.    Past Medical History  Diagnosis Date  . Ovarian cyst   . Kidney stones   . UTI (lower urinary tract infection)   . Calcium oxalate renal stones   . Asthma     Past Surgical History  Procedure Date  . Ovarian cyst removal   . Lithotripsy   . Laparoscopic tubal ligation   . Cholecystectomy   . Appendectomy     No family history on file.  History  Substance Use Topics  . Smoking status: Current Every Day Smoker -- 0.5 packs/day    Types: Cigarettes  . Smokeless tobacco: Not on file  . Alcohol Use: No    OB History    Grav Para Term Preterm Abortions TAB SAB Ect Mult Living                  Review of Systems  Constitutional: Positive for fever, chills and fatigue. Negative for activity change.       All ROS Neg except as noted in HPI  HENT: Positive for sore throat. Negative for nosebleeds and neck pain.   Eyes: Negative for photophobia and discharge.  Respiratory: Negative for cough, shortness of breath and wheezing.   Cardiovascular: Negative for chest pain and palpitations.  Gastrointestinal: Negative for abdominal pain and blood in stool.  Genitourinary: Negative for dysuria, frequency and hematuria.  Musculoskeletal: Negative for back pain and arthralgias.  Skin: Negative.    Neurological: Negative for dizziness, seizures and speech difficulty.  Psychiatric/Behavioral: Negative for hallucinations and confusion.    Allergies  Iohexol; Zofran; Ketorolac tromethamine; and Morphine and related  Home Medications   Current Outpatient Rx  Name Route Sig Dispense Refill  . ACETAMINOPHEN 500 MG PO TABS Oral Take 1,500 mg by mouth every 6 (six) hours as needed. Pain    . IBUPROFEN 800 MG PO TABS Oral Take 800 mg by mouth every 6 (six) hours as needed. pain      BP 129/98  Pulse 105  Temp 101.3 F (38.5 C) (Oral)  Resp 20  Ht 5\' 4"  (1.626 m)  Wt 217 lb (98.431 kg)  BMI 37.25 kg/m2  SpO2 100%  LMP 08/08/2011  Physical Exam  Nursing note and vitals reviewed. Constitutional: She is oriented to person, place, and time. She appears well-developed and well-nourished.  Non-toxic appearance.       Patient is tearful.  HENT:  Head: Normocephalic.  Right Ear: Tympanic membrane and external ear normal.  Left Ear: Tympanic membrane and external ear normal.       There is increased redness of the posterior pharynx. The uvula is mild to moderately enlarged.  patient will not cooperate for full evaluation to assess for abscess. limited evaluation of the airway appears to be patent.   Eyes: EOM and  lids are normal. Pupils are equal, round, and reactive to light.  Neck: Normal range of motion. Neck supple. Carotid bruit is not present. No tracheal deviation present.  Cardiovascular: Normal rate, regular rhythm, normal heart sounds, intact distal pulses and normal pulses.   Pulmonary/Chest: Breath sounds normal. No stridor. No respiratory distress.  Abdominal: Soft. Bowel sounds are normal. There is no tenderness. There is no guarding.  Musculoskeletal: Normal range of motion.  Lymphadenopathy:       Head (right side): No submandibular adenopathy present.       Head (left side): No submandibular adenopathy present.    She has no cervical adenopathy.  Neurological: She  is alert and oriented to person, place, and time. She has normal strength. No cranial nerve deficit or sensory deficit.  Skin: Skin is warm and dry.  Psychiatric: Her speech is normal. Her mood appears anxious.    ED Course  Procedures (including critical care time)   Labs Reviewed  RAPID STREP SCREEN   No results found.   No diagnosis found.    MDM  I have reviewed nursing notes, vital signs, and all appropriate lab and imaging results for this patient. Patient presented to the emergency department with temperature of 101.3 she was given ibuprofen 800 mg and the temperature at the time of discharge was down to 100.3. The pulse oximetry is 100% and remains at 100% at discharge.  Recheck of the throat reveal increased redness of the posterior pharynx, but not abscess noted. The patient has no rash and no nuchal rigidity appreciated. The patient is discharged with prescriptions for amoxicillin 3 times daily, Norco 15 tablets, ibuprofen 3 times daily for fever and aching. Patient advised to wash hands frequently. She is invited to return if any changes, problems, or concerns.       Kathie Dike, [place] 11/14/11 [phone]

## 2011-11-14 NOTE — ED Notes (Addendum)
Pt found tearful in exam room, refusing to talk. Pt asked to answer questions, States has sore throat x 2 days and bilateral earaches x 1. Pt is noted to have a fever and is tachycardic. Pt also reports "coughing up blood" x 2 days. Denies rhinitis and cough. NAD noted  BBS clear and equal at this time.

## 2011-11-14 NOTE — ED Notes (Signed)
Sore throat for 2 days, bil ear aches for 1 days

## 2011-11-14 NOTE — ED Provider Notes (Signed)
Medical screening examination/treatment/procedure(s) were performed by non-physician practitioner and as supervising physician I was immediately available for consultation/collaboration.   Oshua Mcconaha L Evan Mackie, MD 11/14/11 1620 

## 2011-11-14 NOTE — ED Notes (Signed)
Pt swallowed oral medication without difficulty.

## 2011-11-16 ENCOUNTER — Ambulatory Visit (INDEPENDENT_AMBULATORY_CARE_PROVIDER_SITE_OTHER): Payer: Medicaid Other | Admitting: Internal Medicine

## 2011-11-17 ENCOUNTER — Emergency Department (HOSPITAL_COMMUNITY): Payer: Medicaid Other

## 2011-11-17 ENCOUNTER — Encounter (HOSPITAL_COMMUNITY): Payer: Self-pay | Admitting: Physical Medicine and Rehabilitation

## 2011-11-17 ENCOUNTER — Emergency Department (HOSPITAL_COMMUNITY)
Admission: EM | Admit: 2011-11-17 | Discharge: 2011-11-17 | Disposition: A | Discharge status: Home / Self Care | Payer: Medicaid Other | Attending: Emergency Medicine | Admitting: Emergency Medicine

## 2011-11-17 DIAGNOSIS — R109 Unspecified abdominal pain: Secondary | ICD-10-CM | POA: Insufficient documentation

## 2011-11-17 DIAGNOSIS — N760 Acute vaginitis: Secondary | ICD-10-CM | POA: Insufficient documentation

## 2011-11-17 DIAGNOSIS — B9689 Other specified bacterial agents as the cause of diseases classified elsewhere: Secondary | ICD-10-CM | POA: Insufficient documentation

## 2011-11-17 DIAGNOSIS — A499 Bacterial infection, unspecified: Secondary | ICD-10-CM | POA: Insufficient documentation

## 2011-11-17 DIAGNOSIS — N83201 Unspecified ovarian cyst, right side: Secondary | ICD-10-CM

## 2011-11-17 DIAGNOSIS — R10819 Abdominal tenderness, unspecified site: Secondary | ICD-10-CM | POA: Insufficient documentation

## 2011-11-17 DIAGNOSIS — G8929 Other chronic pain: Secondary | ICD-10-CM | POA: Insufficient documentation

## 2011-11-17 DIAGNOSIS — N83209 Unspecified ovarian cyst, unspecified side: Secondary | ICD-10-CM | POA: Insufficient documentation

## 2011-11-17 LAB — WET PREP, GENITAL
Trich, Wet Prep: NONE SEEN
Yeast Wet Prep HPF POC: NONE SEEN

## 2011-11-17 LAB — CBC WITH DIFFERENTIAL/PLATELET
Basophils Absolute: 0.1 10*3/uL (ref 0.0–0.1)
Eosinophils Relative: 1 % (ref 0–5)
Lymphocytes Relative: 27 % (ref 12–46)
MCV: 86.2 fL (ref 78.0–100.0)
Monocytes Relative: 6 % (ref 3–12)
Platelets: UNDETERMINED 10*3/uL (ref 150–400)
RBC: 4.49 MIL/uL (ref 3.87–5.11)
RDW: 13.3 % (ref 11.5–15.5)
WBC: 8.4 10*3/uL (ref 4.0–10.5)

## 2011-11-17 LAB — URINALYSIS, ROUTINE W REFLEX MICROSCOPIC
Glucose, UA: NEGATIVE mg/dL
Ketones, ur: NEGATIVE mg/dL
Protein, ur: 30 mg/dL — AB
Urobilinogen, UA: 1 mg/dL (ref 0.0–1.0)

## 2011-11-17 LAB — POCT I-STAT, CHEM 8
BUN: 7 mg/dL (ref 6–23)
Calcium, Ion: 1.2 mmol/L (ref 1.12–1.23)
Chloride: 107 mEq/L (ref 96–112)
HCT: 39 % (ref 36.0–46.0)
Potassium: 3.2 mEq/L — ABNORMAL LOW (ref 3.5–5.1)
Sodium: 143 mEq/L (ref 135–145)

## 2011-11-17 LAB — URINE MICROSCOPIC-ADD ON

## 2011-11-17 LAB — POCT PREGNANCY, URINE: Preg Test, Ur: NEGATIVE

## 2011-11-17 MED ORDER — ONDANSETRON HCL 4 MG/2ML IJ SOLN: 4.0000 mg | Freq: Once | INTRAMUSCULAR | Status: DC

## 2011-11-17 MED ORDER — HYDROMORPHONE HCL PF 1 MG/ML IJ SOLN
1.0000 mg | Freq: Once | INTRAMUSCULAR | Status: AC
  Administered 2011-11-17: 1 mg via INTRAMUSCULAR

## 2011-11-17 MED ORDER — PROMETHAZINE HCL 25 MG PO TABS: 25.0000 mg | ORAL_TABLET | Freq: Four times a day (QID) | ORAL | Status: DC | PRN

## 2011-11-17 MED ORDER — PROMETHAZINE HCL 25 MG/ML IJ SOLN
25.0000 mg | Freq: Once | INTRAMUSCULAR | Status: DC
  Filled 2011-11-17 (×2): qty 1

## 2011-11-17 MED ORDER — SODIUM CHLORIDE 0.9 % IV BOLUS (SEPSIS): 1000.0000 mL | Freq: Once | INTRAVENOUS | Status: DC

## 2011-11-17 MED ORDER — HYDROMORPHONE HCL PF 1 MG/ML IJ SOLN
1.0000 mg | Freq: Once | INTRAMUSCULAR | Status: DC
  Filled 2011-11-17: qty 1

## 2011-11-17 MED ORDER — HYDROCODONE-ACETAMINOPHEN 5-500 MG PO TABS: 1.0000 | ORAL_TABLET | Freq: Four times a day (QID) | ORAL | Status: DC | PRN

## 2011-11-17 NOTE — ED Provider Notes (Signed)
History     CSN: [id number]  Arrival date & time 11/17/11  0865   First MD Initiated Contact with Patient 11/17/11 [phone]      Chief Complaint  Patient presents with  . Abdominal Pain    (Consider location/radiation/quality/duration/timing/severity/associated sxs/prior treatment) HPI 26 year old female with history of chronic abdominal pain, multiple ER visits for same, presents complaining of pain to her low abdomen. Onset 4 days ago, gradual, wax and wane, non radiating, worsen when laying on R side and improves with heating pad.  Also endorse dysuria with hematuria.  Endorse nausea and persistent vomiting >5 times a day.  Vomitus is nonbloody, nonbilious.  Denies fever, chills, cp, sob, vaginal discharge.  LMP in June.  She is G11P2 and report hx of ectopic pregnancy.        Past Medical History  Diagnosis Date  . Ovarian cyst   . Kidney stones   . UTI (lower urinary tract infection)   . Calcium oxalate renal stones   . Asthma     Past Surgical History  Procedure Date  . Ovarian cyst removal   . Lithotripsy   . Laparoscopic tubal ligation   . Cholecystectomy   . Appendectomy     No family history on file.  History  Substance Use Topics  . Smoking status: Current Every Day Smoker -- 0.5 packs/day    Types: Cigarettes  . Smokeless tobacco: Not on file  . Alcohol Use: No    OB History    Grav Para Term Preterm Abortions TAB SAB Ect Mult Living                  Review of Systems  All other systems reviewed and are negative.    Allergies  Iohexol; Zofran; Ketorolac tromethamine; and Morphine and related  Home Medications   Current Outpatient Rx  Name Route Sig Dispense Refill  . ACETAMINOPHEN 500 MG PO TABS Oral Take 1,500 mg by mouth every 6 (six) hours as needed. Pain    . AMOXICILLIN 500 MG PO CAPS Oral Take 1 capsule (500 mg total) by mouth 3 (three) times daily. 21 capsule 0  . HYDROCODONE-ACETAMINOPHEN 5-500 MG PO TABS Oral Take 1-2 tablets by  mouth every 4 (four) hours as needed for pain. 15 tablet 0  . IBUPROFEN 800 MG PO TABS Oral Take 800 mg by mouth every 6 (six) hours as needed. pain    . IBUPROFEN 800 MG PO TABS Oral Take 1 tablet (800 mg total) by mouth 3 (three) times daily. 21 tablet 0    BP 153/93  Pulse 103  Temp 98.4 F (36.9 C) (Oral)  Resp 20  SpO2 100%  Physical Exam  Nursing note and vitals reviewed. Constitutional: She appears well-developed and well-nourished.       Awake, alert, nontoxic appearance.  tearful  HENT:  Head: Atraumatic.  Mouth/Throat: Oropharynx is clear and moist.  Eyes: Conjunctivae normal are normal. Right eye exhibits no discharge. Left eye exhibits no discharge.  Neck: Neck supple.  Cardiovascular: Normal rate and regular rhythm.   Pulmonary/Chest: Effort normal. No respiratory distress. She exhibits no tenderness.  Abdominal: Soft. There is tenderness (ttp to RLQ and suprapubic region without guarding or rebound tenderness.  No hernia or overlying skin changes noted). There is no rebound. Hernia confirmed negative in the right inguinal area and confirmed negative in the left inguinal area.  Genitourinary: Pelvic exam was performed with patient supine. There is no tenderness on the right labia. There  is no tenderness on the left labia. Cervix exhibits discharge. Cervix exhibits no motion tenderness and no friability. Right adnexum displays tenderness. Left adnexum displays no tenderness. No tenderness or bleeding around the vagina. Vaginal discharge found.       Chaperone present  Musculoskeletal: She exhibits no tenderness.       ROM appears intact, no obvious focal weakness  Neurological:       Mental status and motor strength appears intact  Skin: No rash noted.  Psychiatric: She has a normal mood and affect.    ED Course  Procedures (including critical care time)   Labs Reviewed  URINALYSIS, ROUTINE W REFLEX MICROSCOPIC   No results found.   No diagnosis  found.  Results for orders placed during the hospital encounter of 11/17/11  URINALYSIS, ROUTINE W REFLEX MICROSCOPIC      Component Value Range   Color, Urine YELLOW  YELLOW   APPearance CLOUDY (*) CLEAR   Specific Gravity, Urine 1.022  1.005 - 1.030   pH 7.0  5.0 - 8.0   Glucose, UA NEGATIVE  NEGATIVE mg/dL   Hgb urine dipstick LARGE (*) NEGATIVE   Bilirubin Urine NEGATIVE  NEGATIVE   Ketones, ur NEGATIVE  NEGATIVE mg/dL   Protein, ur 30 (*) NEGATIVE mg/dL   Urobilinogen, UA 1.0  0.0 - 1.0 mg/dL   Nitrite NEGATIVE  NEGATIVE   Leukocytes, UA MODERATE (*) NEGATIVE  POCT PREGNANCY, URINE      Component Value Range   Preg Test, Ur NEGATIVE  NEGATIVE  WET PREP, GENITAL      Component Value Range   Yeast Wet Prep HPF POC NONE SEEN  NONE SEEN   Trich, Wet Prep NONE SEEN  NONE SEEN   Clue Cells Wet Prep HPF POC MODERATE (*) NONE SEEN   WBC, Wet Prep HPF POC MANY (*) NONE SEEN  CBC WITH DIFFERENTIAL      Component Value Range   WBC 8.4  4.0 - 10.5 K/uL   RBC 4.49  3.87 - 5.11 MIL/uL   Hemoglobin 13.1  12.0 - 15.0 g/dL   HCT 54.0  98.1 - 19.1 %   MCV 86.2  78.0 - 100.0 fL   MCH 29.2  26.0 - 34.0 pg   MCHC 33.9  30.0 - 36.0 g/dL   RDW 47.8  29.5 - 62.1 %   Platelets PLATELET CLUMPS NOTED ON SMEAR, UNABLE TO ESTIMATE  150 - 400 K/uL   Neutrophils Relative 65  43 - 77 %   Lymphocytes Relative 27  12 - 46 %   Monocytes Relative 6  3 - 12 %   Eosinophils Relative 1  0 - 5 %   Basophils Relative 1  0 - 1 %   Neutro Abs 5.4  1.7 - 7.7 K/uL   Lymphs Abs 2.3  0.7 - 4.0 K/uL   Monocytes Absolute 0.5  0.1 - 1.0 K/uL   Eosinophils Absolute 0.1  0.0 - 0.7 K/uL   Basophils Absolute 0.1  0.0 - 0.1 K/uL   WBC Morphology ATYPICAL LYMPHOCYTES    POCT I-STAT, CHEM 8      Component Value Range   Sodium 143  135 - 145 mEq/L   Potassium 3.2 (*) 3.5 - 5.1 mEq/L   Chloride 107  96 - 112 mEq/L   BUN 7  6 - 23 mg/dL   Creatinine, Ser 3.08  0.50 - 1.10 mg/dL   Glucose, Bld 90  70 - 99 mg/dL  Calcium, Ion 1.20  1.12 - 1.23 mmol/L   TCO2 24  0 - 100 mmol/L   Hemoglobin 13.3  12.0 - 15.0 g/dL   HCT 04.5  40.9 - 81.1 %  URINE MICROSCOPIC-ADD ON      Component Value Range   Squamous Epithelial / LPF FEW (*) RARE   WBC, UA 7-10  <3 WBC/hpf   RBC / HPF TOO NUMEROUS TO COUNT  <3 RBC/hpf   Bacteria, UA FEW (*) RARE   Crystals CA OXALATE CRYSTALS (*) NEGATIVE   Urine-Other MUCOUS PRESENT     Ct Abdomen Pelvis Wo Contrast  10/21/2011  *RADIOLOGY REPORT*  Clinical Data: Right flank pain.  CT ABDOMEN AND PELVIS WITHOUT CONTRAST  Technique:  Multidetector CT imaging of the abdomen and pelvis was performed following the standard protocol without intravenous contrast.  Comparison: 07/01/2011  Findings: And atelectasis in the lung bases.  Bilateral intrarenal calculi demonstrated with two stones visualized in the lower pole right kidney and multiple stones demonstrated in the mid and lower pole of the left kidney.  Largest stone measures about 3 mm.  No pyelocaliectasis or ureterectasis. No ureteral or bladder stones.  Stable calcified phleboliths in the pelvis.  No bladder wall thickening.  Surgical absence of the gallbladder.  Minimal pneumobilia consistent with postoperative change.  Unenhanced appearance of the liver, spleen, pancreas, adrenal glands, abdominal aorta, and retroperitoneal lymph nodes is unremarkable.  Stomach, small bowel, and colon are decompressed.  No free air or free fluid in the abdomen.  Broad-based umbilical hernia containing fat with slight bulging of bowel.  No proximal distension.  Pelvis:  Subcutaneous emphysema over the right gluteal region likely representing sites of injection.  The uterus and adnexal structures are not enlarged.  Bladder wall is not thickened. Surgical clips consistent with tubal ligations.  No free or loculated pelvic fluid collection.  Surgical clips at the cecum suggesting prior appendectomy.  Normal alignment of the lumbar vertebrae.  IMPRESSION:  Multiple bilateral nonobstructing renal stones.  No ureteral stone or obstruction demonstrated.  Postoperative changes.  No acute process suggested.   Original Report Authenticated By: Marlon Pel, M.D.    US Transvaginal Non-ob  11/17/2011  *RADIOLOGY REPORT*  Clinical Data: Right adnexal tenderness question tubo-ovarian abscess; negative urine pregnancy test  TRANSABDOMINAL AND TRANSVAGINAL ULTRASOUND OF PELVIS Technique:  Both transabdominal and transvaginal ultrasound examinations of the pelvis were performed. Transabdominal technique was performed for global imaging of the pelvis including uterus, ovaries, adnexal regions, and pelvic cul-de-sac. Transabdominal imaging is severely limited by inadequate bladder distention and poor acoustic window.  It was necessary to proceed with endovaginal exam following the transabdominal exam to visualize the uterus and ovaries.  Comparison:  10/22/2011  Findings:  Uterus: 8.1 cm length by 4.7 cm AP by 5.9 cm transverse. Tiny cystic area 5 mm diameter within peripheral myometrium at uterine fundus.  Endometrium: 8 mm thick, normal.  No endometrial fluid.  Right ovary:  3.5 x 2.3 x 2.6 cm.  Small follicle cyst 1.8 x 1.6 x 1.8 cm.  Blood flow present within right ovary on color Doppler imaging.  No solid mass seen.  Left ovary: 2.6 x 1.7 x 2.4 cm.  Normal morphology without mass.  Other findings: No free fluid, adnexal masses, or fallopian tube dilatation.  IMPRESSION: Small follicle cyst right ovary 1.8 cm diameter. Otherwise normal exam.   Original Report Authenticated By: Lollie Marrow, M.D.    US Transvaginal Non-ob  10/22/2011  *RADIOLOGY REPORT*  Clinical Data:  Right lower quadrant abdominal pain.  Clinical concern for ovarian torsion.  Previous appendectomy.  TRANSABDOMINAL AND TRANSVAGINAL ULTRASOUND OF PELVIS DOPPLER ULTRASOUND OF OVARIES  Technique:  Both transabdominal and transvaginal ultrasound examinations of the pelvis were performed.  Transabdominal technique was performed for global imaging of the pelvis including uterus, ovaries, adnexal regions, and pelvic cul-de-sac.  It was necessary to proceed with endovaginal exam following the transabdominal exam to visualize the uterus and ovaries in better detail and to the obtain accurate Doppler measurements of the ovaries.  Color and duplex Doppler ultrasound was utilized to evaluate blood flow to the ovaries.  Comparison:  Pelvis CT dated 10/21/2011.  Findings:  Uterus:  Normal in size and appearance  Endometrium:  Normal in thickness and appearance  Right ovary: Normal.  Normal internal blood flow with color Doppler.  Left ovary:     Normal.  Normal internal blood flow with color Doppler.  Pulsed Doppler evaluation demonstrates normal low-resistance arterial and venous waveforms in both ovaries.  IMPRESSION: Normal exam.  No evidence of pelvic mass or other significant abnormality.  No sonographic evidence for ovarian torsion.   Original Report Authenticated By: Darrol Angel, M.D.    US Pelvis Complete  11/17/2011  *RADIOLOGY REPORT*  Clinical Data: Right adnexal tenderness question tubo-ovarian abscess; negative urine pregnancy test  TRANSABDOMINAL AND TRANSVAGINAL ULTRASOUND OF PELVIS Technique:  Both transabdominal and transvaginal ultrasound examinations of the pelvis were performed. Transabdominal technique was performed for global imaging of the pelvis including uterus, ovaries, adnexal regions, and pelvic cul-de-sac. Transabdominal imaging is severely limited by inadequate bladder distention and poor acoustic window.  It was necessary to proceed with endovaginal exam following the transabdominal exam to visualize the uterus and ovaries.  Comparison:  10/22/2011  Findings:  Uterus: 8.1 cm length by 4.7 cm AP by 5.9 cm transverse. Tiny cystic area 5 mm diameter within peripheral myometrium at uterine fundus.  Endometrium: 8 mm thick, normal.  No endometrial fluid.  Right ovary:  3.5 x 2.3  x 2.6 cm.  Small follicle cyst 1.8 x 1.6 x 1.8 cm.  Blood flow present within right ovary on color Doppler imaging.  No solid mass seen.  Left ovary: 2.6 x 1.7 x 2.4 cm.  Normal morphology without mass.  Other findings: No free fluid, adnexal masses, or fallopian tube dilatation.  IMPRESSION: Small follicle cyst right ovary 1.8 cm diameter. Otherwise normal exam.   Original Report Authenticated By: Lollie Marrow, M.D.    US Pelvis Complete  10/22/2011  *RADIOLOGY REPORT*  Clinical Data:  Right lower quadrant abdominal pain.  Clinical concern for ovarian torsion.  Previous appendectomy.  TRANSABDOMINAL AND TRANSVAGINAL ULTRASOUND OF PELVIS DOPPLER ULTRASOUND OF OVARIES  Technique:  Both transabdominal and transvaginal ultrasound examinations of the pelvis were performed. Transabdominal technique was performed for global imaging of the pelvis including uterus, ovaries, adnexal regions, and pelvic cul-de-sac.  It was necessary to proceed with endovaginal exam following the transabdominal exam to visualize the uterus and ovaries in better detail and to the obtain accurate Doppler measurements of the ovaries.  Color and duplex Doppler ultrasound was utilized to evaluate blood flow to the ovaries.  Comparison:  Pelvis CT dated 10/21/2011.  Findings:  Uterus:  Normal in size and appearance  Endometrium:  Normal in thickness and appearance  Right ovary: Normal.  Normal internal blood flow with color Doppler.  Left ovary:     Normal.  Normal internal blood flow with color Doppler.  Pulsed Doppler evaluation demonstrates normal low-resistance arterial and venous waveforms in both ovaries.  IMPRESSION: Normal exam.  No evidence of pelvic mass or other significant abnormality.  No sonographic evidence for ovarian torsion.   Original Report Authenticated By: Darrol Angel, M.D.    [place] Art/ven Flow Abd Pelv Doppler  10/22/2011  *RADIOLOGY REPORT*  Clinical Data:  Right lower quadrant abdominal pain.  Clinical concern for  ovarian torsion.  Previous appendectomy.  TRANSABDOMINAL AND TRANSVAGINAL ULTRASOUND OF PELVIS DOPPLER ULTRASOUND OF OVARIES  Technique:  Both transabdominal and transvaginal ultrasound examinations of the pelvis were performed. Transabdominal technique was performed for global imaging of the pelvis including uterus, ovaries, adnexal regions, and pelvic cul-de-sac.  It was necessary to proceed with endovaginal exam following the transabdominal exam to visualize the uterus and ovaries in better detail and to the obtain accurate Doppler measurements of the ovaries.  Color and duplex Doppler ultrasound was utilized to evaluate blood flow to the ovaries.  Comparison:  Pelvis CT dated 10/21/2011.  Findings:  Uterus:  Normal in size and appearance  Endometrium:  Normal in thickness and appearance  Right ovary: Normal.  Normal internal blood flow with color Doppler.  Left ovary:     Normal.  Normal internal blood flow with color Doppler.  Pulsed Doppler evaluation demonstrates normal low-resistance arterial and venous waveforms in both ovaries.  IMPRESSION: Normal exam.  No evidence of pelvic mass or other significant abnormality.  No sonographic evidence for ovarian torsion.   Original Report Authenticated By: Darrol Angel, M.D.     1.ovarian cyst 2. Bacterial vaginosis  MDM  Pt with hx of chronic abd pain and mult ER visits for same.  She presents with RLQ.  Pelvic  Exam is remarkable to greenish discharge from cervix and R adnexal tenderness.  Pt has prior appendectomy, doubt appendicitis.  Due to presentation of her sxs, doubt ovarian torsion.  Pt is nontoxic, afebrile, doubt tuboovarian abscess.  Has hx of ovarian cyst.     11:15 AM Unable to establish IV access despite multiple attempts per nurse and IV team.  Will give pt pain medication via IM.  No signs of dehydration based on lab values  12:48 PM Pelvic US ordered to d/o tuboovarian abscess.  Result shows a 1.8cm R ovarian cyst but no acute  finding noted.  Since pt is afebrile, with normal WBC, and no signs of dehydration.  I plan to d/c pt with flagyl to treat for BV as evidence on Wet prep.  Pt has no CMT, Doubt PID.  She has hx of kidney stone, i do not think abd Ct is warranted at this time.  I will d/c with pain medication, antinausea medication and recommend pt to f/u with urologist for further care . Pt able to tolerates PO.    BP 153/93  Pulse 103  Temp 98.4 F (36.9 C) (Oral)  Resp 20  SpO2 100%  Nursing notes reviewed and considered in documentation  Previous records reviewed and considered  All labs/vitals reviewed and considered  xrays reviewed and considered    Fayrene Helper, PA-C 11/17/11 1255

## 2011-11-17 NOTE — ED Notes (Signed)
Iv team at bedside  

## 2011-11-17 NOTE — ED Notes (Signed)
After 3 rn attempts and two different IV team attempts RN notified md of no access.  Ultrasound at bedside

## 2011-11-17 NOTE — ED Provider Notes (Signed)
Medical screening examination/treatment/procedure(s) were performed by non-physician practitioner and as supervising physician I was immediately available for consultation/collaboration.   Carleene Cooper III, MD 11/17/11 2019

## 2011-11-17 NOTE — ED Notes (Signed)
Pt reports lower rt abdominal pain since sat.  Pt states she has n/v but denies diarrhea.  Hx of ruptured ovarian cyst with same pain presentation.  Pt reports 10/10 abdominal pain that increases with pressure.  Pt alert oriented X4

## 2011-11-17 NOTE — ED Notes (Signed)
Pt presents to department for evaluation of lower abdominal pain. Ongoing since Saturday. Also states nausea/vomiting and urinary frequency. 9/10 pain at the time. Denies vaginal symptoms. She is alert and oriented x4.

## 2011-11-17 NOTE — ED Notes (Addendum)
Per MD pt is to be discharged after PO challenge.  Pt given sprite and instructed to drink slowly

## 2011-12-28 ENCOUNTER — Emergency Department (HOSPITAL_COMMUNITY): Payer: Medicaid Other

## 2011-12-28 ENCOUNTER — Encounter (HOSPITAL_COMMUNITY): Payer: Self-pay | Admitting: *Deleted

## 2011-12-28 ENCOUNTER — Emergency Department (HOSPITAL_COMMUNITY)
Admission: EM | Admit: 2011-12-28 | Discharge: 2011-12-28 | Disposition: A | Discharge status: Home / Self Care | Payer: Medicaid Other | Attending: Emergency Medicine | Admitting: Emergency Medicine

## 2011-12-28 DIAGNOSIS — R112 Nausea with vomiting, unspecified: Secondary | ICD-10-CM | POA: Insufficient documentation

## 2011-12-28 DIAGNOSIS — J45909 Unspecified asthma, uncomplicated: Secondary | ICD-10-CM | POA: Insufficient documentation

## 2011-12-28 DIAGNOSIS — A498 Other bacterial infections of unspecified site: Secondary | ICD-10-CM | POA: Insufficient documentation

## 2011-12-28 DIAGNOSIS — R109 Unspecified abdominal pain: Secondary | ICD-10-CM | POA: Insufficient documentation

## 2011-12-28 DIAGNOSIS — R319 Hematuria, unspecified: Secondary | ICD-10-CM

## 2011-12-28 DIAGNOSIS — R10A0 Flank pain, unspecified side: Secondary | ICD-10-CM

## 2011-12-28 DIAGNOSIS — F17200 Nicotine dependence, unspecified, uncomplicated: Secondary | ICD-10-CM | POA: Insufficient documentation

## 2011-12-28 DIAGNOSIS — Z87442 Personal history of urinary calculi: Secondary | ICD-10-CM | POA: Insufficient documentation

## 2011-12-28 LAB — URINALYSIS, ROUTINE W REFLEX MICROSCOPIC
Bilirubin Urine: NEGATIVE
Ketones, ur: NEGATIVE mg/dL
Specific Gravity, Urine: 1.02 (ref 1.005–1.030)
pH: 7 (ref 5.0–8.0)

## 2011-12-28 LAB — URINE MICROSCOPIC-ADD ON

## 2011-12-28 LAB — PREGNANCY, URINE: Preg Test, Ur: NEGATIVE

## 2011-12-28 MED ORDER — PROMETHAZINE HCL 25 MG/ML IJ SOLN
INTRAMUSCULAR | Status: AC
  Administered 2011-12-28: 12.5 mg
  Filled 2011-12-28: qty 1

## 2011-12-28 MED ORDER — HYDROMORPHONE HCL PF 1 MG/ML IJ SOLN
1.0000 mg | Freq: Once | INTRAMUSCULAR | Status: AC
  Administered 2011-12-28: 1 mg via INTRAVENOUS
  Filled 2011-12-28: qty 1

## 2011-12-28 MED ORDER — CIPROFLOXACIN HCL 250 MG PO TABS
500.0000 mg | ORAL_TABLET | Freq: Once | ORAL | Status: AC
  Administered 2011-12-28: 500 mg via ORAL
  Filled 2011-12-28: qty 2

## 2011-12-28 MED ORDER — PROMETHAZINE HCL 25 MG PO TABS: 12.5000 mg | ORAL_TABLET | Freq: Four times a day (QID) | ORAL | Status: AC | PRN

## 2011-12-28 MED ORDER — SODIUM CHLORIDE 0.9 % IV SOLN
Freq: Once | INTRAVENOUS | Status: AC
  Administered 2011-12-28: 04:00:00 via INTRAVENOUS

## 2011-12-28 MED ORDER — PROMETHAZINE HCL 25 MG/ML IJ SOLN
12.5000 mg | Freq: Once | INTRAMUSCULAR | Status: AC
  Administered 2011-12-28: 12.5 mg via INTRAMUSCULAR
  Filled 2011-12-28: qty 1

## 2011-12-28 MED ORDER — HYDROCODONE-ACETAMINOPHEN 5-325 MG PO TABS: 1.0000 | ORAL_TABLET | ORAL | Status: AC | PRN

## 2011-12-28 MED ORDER — HYDROMORPHONE HCL PF 2 MG/ML IJ SOLN
2.0000 mg | Freq: Once | INTRAMUSCULAR | Status: AC
  Administered 2011-12-28: 2 mg via INTRAMUSCULAR
  Filled 2011-12-28: qty 1

## 2011-12-28 MED ORDER — PROMETHAZINE HCL 25 MG/ML IJ SOLN
12.5000 mg | Freq: Once | INTRAMUSCULAR | Status: AC
  Administered 2011-12-28: 12.5 mg via INTRAVENOUS
  Filled 2011-12-28: qty 1

## 2011-12-28 MED ORDER — CIPROFLOXACIN HCL 500 MG PO TABS: 500.0000 mg | ORAL_TABLET | Freq: Two times a day (BID) | ORAL | Status: DC

## 2011-12-28 NOTE — ED Notes (Addendum)
Pt c/o right sided flank pain since Friday.

## 2011-12-28 NOTE — ED Provider Notes (Addendum)
History     CSN: [id number]  Arrival date & time 12/28/11  0142   First MD Initiated Contact with Patient 12/28/11 0253      Chief Complaint  Patient presents with  . Back Pain  . Flank Pain    (Consider location/radiation/quality/duration/timing/severity/associated sxs/prior treatment) HPI [name] is a 26 y.o. female  With a h/o chronic abdominal pain, recurrent urinary tract infections, kidney stones who presents to the Emergency Department complaining of  Right flank pain with radiation to the lower abdomen that began on Friday and has gotten progressively worse. Associated with nausea and vomiting. She denies fever, chills. Last kidney stone was a month ago. She also was recently treated for UTI. She is followed by Dr. Jerre Simon.  PCP Dr. Ihor Austin Urologist Dr. Jerre Simon    Past Medical History  Diagnosis Date  . Ovarian cyst   . Kidney stones   . UTI (lower urinary tract infection)   . Calcium oxalate renal stones   . Asthma     Past Surgical History  Procedure Date  . Ovarian cyst removal   . Lithotripsy   . Laparoscopic tubal ligation   . Cholecystectomy   . Appendectomy     History reviewed. No pertinent family history.  History  Substance Use Topics  . Smoking status: Current Every Day Smoker -- 0.5 packs/day    Types: Cigarettes  . Smokeless tobacco: Not on file  . Alcohol Use: No    OB History    Grav Para Term Preterm Abortions TAB SAB Ect Mult Living                  Review of Systems  Constitutional: Negative for fever.       10 Systems reviewed and are negative for acute change except as noted in the HPI.  HENT: Negative for congestion.   Eyes: Negative for discharge and redness.  Respiratory: Negative for cough and shortness of breath.   Cardiovascular: Negative for chest pain.  Gastrointestinal: Positive for abdominal pain. Negative for vomiting.  Musculoskeletal: Negative for back pain.  Skin: Negative for rash.  Neurological:  Negative for syncope, numbness and headaches.  Psychiatric/Behavioral:       No behavior change.    Allergies  Iohexol; Zofran; Ketorolac tromethamine; and Morphine and related  Home Medications   Current Outpatient Rx  Name  Route  Sig  Dispense  Refill  . IBUPROFEN 800 MG PO TABS   Oral   Take 1 tablet (800 mg total) by mouth 3 (three) times daily.   21 tablet   0     BP 144/86  Pulse 72  Temp 98.6 F (37 C)  Resp 20  Ht 5\' 4"  (1.626 m)  Wt 217 lb (98.431 kg)  BMI 37.25 kg/m2  SpO2 95%  LMP 08/02/2011  Physical Exam  Nursing note and vitals reviewed. Constitutional:       Awake, alert, nontoxic appearance.  HENT:  Head: Atraumatic.  Eyes: Right eye exhibits no discharge. Left eye exhibits no discharge.  Neck: Neck supple.  Pulmonary/Chest: Effort normal. She exhibits no tenderness.  Abdominal: Soft. There is tenderness. There is no rebound.       Mild tenderness to palpation RLQ, no rebound or guarding  Genitourinary:       No cva tenderness  Musculoskeletal: She exhibits no tenderness.       Baseline ROM, no obvious new focal weakness.  Neurological:       Mental status and  motor strength appears baseline for patient and situation.  Skin: No rash noted.  Psychiatric: She has a normal mood and affect.    ED Course  Procedures (including critical care time)  Results for orders placed during the hospital encounter of 12/28/11  URINALYSIS, ROUTINE W REFLEX MICROSCOPIC      Component Value Range   Color, Urine YELLOW  YELLOW   APPearance CLOUDY (*) CLEAR   Specific Gravity, Urine 1.020  1.005 - 1.030   pH 7.0  5.0 - 8.0   Glucose, UA NEGATIVE  NEGATIVE mg/dL   Hgb urine dipstick LARGE (*) NEGATIVE   Bilirubin Urine NEGATIVE  NEGATIVE   Ketones, ur NEGATIVE  NEGATIVE mg/dL   Protein, ur NEGATIVE  NEGATIVE mg/dL   Urobilinogen, UA 0.2  0.0 - 1.0 mg/dL   Nitrite NEGATIVE  NEGATIVE   Leukocytes, UA SMALL (*) NEGATIVE  URINE MICROSCOPIC-ADD ON       Component Value Range   Squamous Epithelial / LPF RARE  RARE   WBC, UA 3-6  <3 WBC/hpf   RBC / HPF 21-50  <3 RBC/hpf   Bacteria, UA MANY (*) RARE  PREGNANCY, URINE      Component Value Range   Preg Test, Ur NEGATIVE  NEGATIVE   Ct Abdomen Pelvis Wo Contrast  12/28/2011  *RADIOLOGY REPORT*  Clinical Data: Right-sided flank pain  CT ABDOMEN AND PELVIS WITHOUT CONTRAST  Technique:  Multidetector CT imaging of the abdomen and pelvis was performed following the standard protocol without intravenous contrast.  Comparison: 10/21/2011  Findings: Limited images through the lung bases demonstrate no significant appreciable abnormality. The heart size is within normal limits. No pleural or pericardial effusion.  Organ abnormality/lesion detection is limited in the absence of intravenous contrast. Within this limitation, unremarkable liver, spleen, pancreas, adrenal glands.  Absent gallbladder.  No biliary ductal dilatation.  Bilateral nonobstructing renal stones.  No hydronephrosis or hydroureter.  Unable to follow the ureteral course in their entirety due to the decompressed state however no   calcifications along the expected course.  No bowel obstruction.  No CT evidence for colitis.  Appendectomy. No free intraperitoneal air or fluid.  No lymphadenopathy.  Normal caliber aorta and branch vessels.  Partially decompressed bladder.  Nonspecific appearance to the uterus.  No adnexal mass. Mildly prominent inguinal lymph nodes bilaterally (measuring up to 11 mm short axis), nonspecific however similar to prior.  No acute osseous finding.  IMPRESSION: Bilateral nonobstructing renal stones.  No hydronephrosis or ureteral calculi.  Mildly prominent inguinal lymph nodes are nonspecific however similar to prior.   Original Report Authenticated By: Jearld Lesch, M.D.       0300 Patient advised she would be leaving AMA. She had been waiting too long and did not want to stay.  [phone] Nursing supervisor spoke with  patient who decided to stay and be seen.   MDM  Patient presents with right flank pain with radiation to the right lower abdomen since Friday. UA with RBCs. CT without ureteral stones, hydronephrosis.Given analgesics, antiemetics with improvement in pain.  Dx testing d/w pt .  Questions answered.  Verb understanding, agreeable to d/c home with outpt f/u. Pt feels improved after observation and/or treatment in ED.Pt stable in ED with no significant deterioration in condition.The patient appears reasonably screened and/or stabilized for discharge and I doubt any other medical condition or other [facility] requiring further screening, evaluation, or treatment in the ED at this time prior to discharge.  MDM Reviewed: nursing note  and vitals Interpretation: labs and CT scan            Nicoletta Dress. Colon Branch, MD 12/28/11 1610  Nicoletta Dress. Colon Branch, MD 01/24/12 [phone]

## 2011-12-28 NOTE — ED Notes (Signed)
Pt continues to moan in pain

## 2011-12-30 LAB — URINE CULTURE

## 2011-12-31 NOTE — ED Notes (Signed)
+   Urine Patient treated with cipro-sensitive to same-chart appended per protocol MD. 

## 2012-01-26 ENCOUNTER — Emergency Department (HOSPITAL_COMMUNITY)
Admission: EM | Admit: 2012-01-26 | Discharge: 2012-01-26 | Disposition: A | Discharge status: Home / Self Care | Payer: Medicaid Other | Attending: Emergency Medicine | Admitting: Emergency Medicine

## 2012-01-26 ENCOUNTER — Encounter (HOSPITAL_COMMUNITY): Payer: Self-pay

## 2012-01-26 DIAGNOSIS — J45909 Unspecified asthma, uncomplicated: Secondary | ICD-10-CM | POA: Insufficient documentation

## 2012-01-26 DIAGNOSIS — F17200 Nicotine dependence, unspecified, uncomplicated: Secondary | ICD-10-CM | POA: Insufficient documentation

## 2012-01-26 DIAGNOSIS — R109 Unspecified abdominal pain: Secondary | ICD-10-CM | POA: Insufficient documentation

## 2012-01-26 DIAGNOSIS — N390 Urinary tract infection, site not specified: Secondary | ICD-10-CM | POA: Insufficient documentation

## 2012-01-26 DIAGNOSIS — Z87442 Personal history of urinary calculi: Secondary | ICD-10-CM | POA: Insufficient documentation

## 2012-01-26 DIAGNOSIS — N949 Unspecified condition associated with female genital organs and menstrual cycle: Secondary | ICD-10-CM | POA: Insufficient documentation

## 2012-01-26 DIAGNOSIS — Z3202 Encounter for pregnancy test, result negative: Secondary | ICD-10-CM | POA: Insufficient documentation

## 2012-01-26 DIAGNOSIS — Z8742 Personal history of other diseases of the female genital tract: Secondary | ICD-10-CM | POA: Insufficient documentation

## 2012-01-26 DIAGNOSIS — R509 Fever, unspecified: Secondary | ICD-10-CM | POA: Insufficient documentation

## 2012-01-26 DIAGNOSIS — R112 Nausea with vomiting, unspecified: Secondary | ICD-10-CM | POA: Insufficient documentation

## 2012-01-26 LAB — URINALYSIS, ROUTINE W REFLEX MICROSCOPIC
Bilirubin Urine: NEGATIVE
Ketones, ur: NEGATIVE mg/dL
Nitrite: POSITIVE — AB
Specific Gravity, Urine: 1.02 (ref 1.005–1.030)
Urobilinogen, UA: 0.2 mg/dL (ref 0.0–1.0)

## 2012-01-26 LAB — CBC WITH DIFFERENTIAL/PLATELET
Eosinophils Relative: 1 % (ref 0–5)
HCT: 42.6 % (ref 36.0–46.0)
Hemoglobin: 14.1 g/dL (ref 12.0–15.0)
Lymphocytes Relative: 31 % (ref 12–46)
Lymphs Abs: 2.1 10*3/uL (ref 0.7–4.0)
MCH: 29.6 pg (ref 26.0–34.0)
MCV: 89.5 fL (ref 78.0–100.0)
Monocytes Absolute: 0.3 10*3/uL (ref 0.1–1.0)
Monocytes Relative: 4 % (ref 3–12)
Platelets: 242 10*3/uL (ref 150–400)
RBC: 4.76 MIL/uL (ref 3.87–5.11)
WBC: 6.6 10*3/uL (ref 4.0–10.5)

## 2012-01-26 LAB — COMPREHENSIVE METABOLIC PANEL
ALT: 30 U/L (ref 0–35)
BUN: 6 mg/dL (ref 6–23)
CO2: 24 mEq/L (ref 19–32)
Calcium: 9.6 mg/dL (ref 8.4–10.5)
GFR calc Af Amer: >90 mL/min (ref >90)
GFR calc non Af Amer: >90 mL/min (ref >90)
Glucose, Bld: 129 mg/dL — ABNORMAL HIGH (ref 70–99)
Sodium: 136 mEq/L (ref 135–145)

## 2012-01-26 LAB — URINE MICROSCOPIC-ADD ON

## 2012-01-26 MED ORDER — PROMETHAZINE HCL 25 MG/ML IJ SOLN
12.5000 mg | Freq: Once | INTRAMUSCULAR | Status: AC
  Administered 2012-01-26: 12.5 mg via INTRAVENOUS
  Filled 2012-01-26: qty 1

## 2012-01-26 MED ORDER — HYDROMORPHONE HCL PF 1 MG/ML IJ SOLN
1.0000 mg | Freq: Once | INTRAMUSCULAR | Status: AC
  Administered 2012-01-26: 1 mg via INTRAVENOUS
  Filled 2012-01-26: qty 1

## 2012-01-26 MED ORDER — HYDROCODONE-ACETAMINOPHEN 5-325 MG PO TABS: ORAL_TABLET | ORAL | Status: DC

## 2012-01-26 MED ORDER — CEPHALEXIN 500 MG PO CAPS: 500.0000 mg | ORAL_CAPSULE | Freq: Four times a day (QID) | ORAL | Status: DC

## 2012-01-26 MED ORDER — METOCLOPRAMIDE HCL 5 MG/ML IJ SOLN
10.0000 mg | Freq: Once | INTRAMUSCULAR | Status: AC
  Administered 2012-01-26: 10 mg via INTRAVENOUS
  Filled 2012-01-26: qty 2

## 2012-01-26 MED ORDER — SODIUM CHLORIDE 0.9 % IV SOLN
Freq: Once | INTRAVENOUS | Status: AC
  Administered 2012-01-26: 09:00:00 via INTRAVENOUS

## 2012-01-26 MED ORDER — DEXTROSE 5 % IV SOLN
1.0000 g | Freq: Once | INTRAVENOUS | Status: AC
  Administered 2012-01-26: 1 g via INTRAVENOUS
  Filled 2012-01-26: qty 10

## 2012-01-26 NOTE — ED Provider Notes (Signed)
History     CSN: [id number]  Arrival date & time 01/26/12  0807   First MD Initiated Contact with Patient 01/26/12 [phone]      Chief Complaint  Patient presents with  . Emesis    (Consider location/radiation/quality/duration/timing/severity/associated sxs/prior treatment) HPI Comments: Patient complains of pelvic pain, dysuria, and intermittent vomiting for 3 days. She states the symptoms began as intermittent pain to her lower abdomen but developed into persistent, sharp pains yesterday. She denies flank or back pain, fever, diarrhea, or hematemesis.  She states the pain feels similar to previous urinary tract infections. She denies any vaginal bleeding or discharge. She states she has not had any menstrual period for several months and denies using any methods of birth control. She has a history of a previous tubal ligation, cholecystectomy, and appendectomy.  Patient is a 26 y.o. female presenting with vomiting. The history is provided by the patient.  Emesis  This is a new problem. The current episode started more than 2 days ago. The problem occurs 2 to 4 times per day. The problem has been gradually improving. The emesis has an appearance of stomach contents. There has been no fever. Associated symptoms include abdominal pain and chills. Pertinent negatives include no arthralgias, no cough, no diarrhea, no fever, no headaches, no myalgias, no sweats and no URI. Associated symptoms comments: Pelvic pain and dysuria.    Past Medical History  Diagnosis Date  . Ovarian cyst   . Kidney stones   . UTI (lower urinary tract infection)   . Calcium oxalate renal stones   . Asthma     Past Surgical History  Procedure Date  . Ovarian cyst removal   . Lithotripsy   . Laparoscopic tubal ligation   . Cholecystectomy   . Appendectomy     No family history on file.  History  Substance Use Topics  . Smoking status: Current Every Day Smoker -- 0.5 packs/day    Types: Cigarettes  .  Smokeless tobacco: Not on file  . Alcohol Use: No    OB History    Grav Para Term Preterm Abortions TAB SAB Ect Mult Living                  Review of Systems  Constitutional: Positive for chills. Negative for fever and appetite change.  Respiratory: Negative for cough and shortness of breath.   Cardiovascular: Negative for chest pain.  Gastrointestinal: Positive for nausea, vomiting and abdominal pain. Negative for diarrhea, blood in stool and abdominal distention.  Genitourinary: Positive for frequency and pelvic pain. Negative for dysuria, hematuria, flank pain, decreased urine volume, vaginal bleeding, vaginal discharge, difficulty urinating and genital sores.  Musculoskeletal: Negative for myalgias, back pain and arthralgias.  Skin: Negative for color change and rash.  Neurological: Negative for dizziness, weakness, numbness and headaches.  Hematological: Negative for adenopathy.  All other systems reviewed and are negative.    Allergies  Iohexol; Zofran; Ketorolac tromethamine; and Morphine and related  Home Medications   Current Outpatient Rx  Name  Route  Sig  Dispense  Refill  . CIPROFLOXACIN HCL 500 MG PO TABS   Oral   Take 1 tablet (500 mg total) by mouth 2 (two) times daily.   14 tablet   0   . IBUPROFEN 800 MG PO TABS   Oral   Take 1 tablet (800 mg total) by mouth 3 (three) times daily.   21 tablet   0     BP 152/119  Pulse 116  Temp 97.6 F (36.4 C) (Oral)  Resp 22  Ht 5\' 4"  (1.626 m)  Wt 217 lb (98.431 kg)  BMI 37.25 kg/m2  SpO2 100%  LMP 08/02/2011  Physical Exam  Nursing note and vitals reviewed. Constitutional: She is oriented to person, place, and time. She appears well-developed and well-nourished.       Uncomfortable appearing  HENT:  Head: Normocephalic and atraumatic.  Mouth/Throat: Oropharynx is clear and moist.  Neck: Normal range of motion. Neck supple.  Cardiovascular: Regular rhythm, normal heart sounds and intact distal  pulses.  Tachycardia present.   No murmur heard. Pulmonary/Chest: Effort normal and breath sounds normal. No respiratory distress. She exhibits no tenderness.  Abdominal: Soft. Normal appearance and bowel sounds are normal. She exhibits no distension and no mass. There is no hepatosplenomegaly. There is tenderness in the suprapubic area. There is no rigidity, no rebound, no guarding, no CVA tenderness and no tenderness at McBurney's point.       Mild to moderate tenderness to deep palpation at the suprapubic region. There is no guarding, rebound tenderness, or tenderness at Burney's point  Musculoskeletal: Normal range of motion. She exhibits no edema.  Lymphadenopathy:    She has no cervical adenopathy.  Neurological: She is alert and oriented to person, place, and time. She exhibits normal muscle tone. Coordination normal.  Skin: Skin is warm and dry.    ED Course  Procedures (including critical care time)  Results for orders placed during the hospital encounter of 01/26/12  CBC WITH DIFFERENTIAL      Component Value Range   WBC 6.6  4.0 - 10.5 K/uL   RBC 4.76  3.87 - 5.11 MIL/uL   Hemoglobin 14.1  12.0 - 15.0 g/dL   HCT 96.0  45.4 - 09.8 %   MCV 89.5  78.0 - 100.0 fL   MCH 29.6  26.0 - 34.0 pg   MCHC 33.1  30.0 - 36.0 g/dL   RDW 11.9  14.7 - 82.9 %   Platelets 242  150 - 400 K/uL   Neutrophils Relative 63  43 - 77 %   Neutro Abs 4.2  1.7 - 7.7 K/uL   Lymphocytes Relative 31  12 - 46 %   Lymphs Abs 2.1  0.7 - 4.0 K/uL   Monocytes Relative 4  3 - 12 %   Monocytes Absolute 0.3  0.1 - 1.0 K/uL   Eosinophils Relative 1  0 - 5 %   Eosinophils Absolute 0.1  0.0 - 0.7 K/uL   Basophils Relative 0  0 - 1 %   Basophils Absolute 0.0  0.0 - 0.1 K/uL  COMPREHENSIVE METABOLIC PANEL      Component Value Range   Sodium 136  135 - 145 mEq/L   Potassium 3.8  3.5 - 5.1 mEq/L   Chloride 103  96 - 112 mEq/L   CO2 24  19 - 32 mEq/L   Glucose, Bld 129 (*) 70 - 99 mg/dL   BUN 6  6 - 23 mg/dL    Creatinine, Ser 5.62  0.50 - 1.10 mg/dL   Calcium 9.6  8.4 - 13.0 mg/dL   Total Protein 8.2  6.0 - 8.3 g/dL   Albumin 4.1  3.5 - 5.2 g/dL   AST 19  0 - 37 U/L   ALT 30  0 - 35 U/L   Alkaline Phosphatase 114  39 - 117 U/L   Total Bilirubin 0.4  0.3 - 1.2 mg/dL  GFR calc non Af Amer >90  >90 mL/min   GFR calc Af Amer >90  >90 mL/min  URINALYSIS, ROUTINE W REFLEX MICROSCOPIC      Component Value Range   Color, Urine YELLOW  YELLOW   APPearance CLEAR  CLEAR   Specific Gravity, Urine 1.020  1.005 - 1.030   pH 6.5  5.0 - 8.0   Glucose, UA NEGATIVE  NEGATIVE mg/dL   Hgb urine dipstick LARGE (*) NEGATIVE   Bilirubin Urine NEGATIVE  NEGATIVE   Ketones, ur NEGATIVE  NEGATIVE mg/dL   Protein, ur NEGATIVE  NEGATIVE mg/dL   Urobilinogen, UA 0.2  0.0 - 1.0 mg/dL   Nitrite POSITIVE (*) NEGATIVE   Leukocytes, UA SMALL (*) NEGATIVE  POCT PREGNANCY, URINE      Component Value Range   Preg Test, Ur NEGATIVE  NEGATIVE  URINE MICROSCOPIC-ADD ON      Component Value Range   Squamous Epithelial / LPF MANY (*) RARE   WBC, UA TOO NUMEROUS TO COUNT  <3 WBC/hpf   RBC / HPF TOO NUMEROUS TO COUNT  <3 RBC/hpf   Bacteria, UA MANY (*) RARE     Urine culture is pending    MDM    Previous ED charts reviewed by me  Patient was treated here on 12/28/2011 for UTI symptoms. Previous urine culture from that date showed sensitivity to ceftriaxone and Cefzil.    Patient has been treated with hydromorphone,  Phenergan, and Rocephin here in the ED.  I will treat with Keflex and referral to urology.   11:00 am patient requesting to leave stating that she needs to pick up her daughter from school.  She states that she is feeling better and appears stable for discharge  Patient agrees to care plan and verbalized understanding of the importance of close followup she also agrees to return here if her symptoms worsen.  Prescribed: Keflex Norco # [id number]  Donnalynn Wheeless L. [place], [place] 01/27/12 2053

## 2012-01-26 NOTE — ED Notes (Signed)
Complain of pelvic pain and vomiting since Saturday

## 2012-01-28 LAB — URINE CULTURE: Colony Count: >=100000

## 2012-01-28 NOTE — ED Provider Notes (Signed)
Medical screening examination/treatment/procedure(s) were performed by non-physician practitioner and as supervising physician I was immediately available for consultation/collaboration.  Byrne Capek, MD 01/28/12 0752 

## 2012-01-29 NOTE — ED Notes (Signed)
+  Urine. Patient treated with Keflex. Sensitive to same. Per protocol MD. °

## 2012-02-12 ENCOUNTER — Encounter (HOSPITAL_COMMUNITY): Payer: Self-pay | Admitting: *Deleted

## 2012-02-12 ENCOUNTER — Emergency Department (HOSPITAL_COMMUNITY)
Admission: EM | Admit: 2012-02-12 | Discharge: 2012-02-12 | Discharge status: Against Medical Advice | Payer: Medicaid Other | Attending: Emergency Medicine | Admitting: Emergency Medicine

## 2012-02-12 DIAGNOSIS — F17200 Nicotine dependence, unspecified, uncomplicated: Secondary | ICD-10-CM | POA: Insufficient documentation

## 2012-02-12 DIAGNOSIS — Z8742 Personal history of other diseases of the female genital tract: Secondary | ICD-10-CM | POA: Insufficient documentation

## 2012-02-12 DIAGNOSIS — J45909 Unspecified asthma, uncomplicated: Secondary | ICD-10-CM | POA: Insufficient documentation

## 2012-02-12 DIAGNOSIS — Z87442 Personal history of urinary calculi: Secondary | ICD-10-CM | POA: Insufficient documentation

## 2012-02-12 DIAGNOSIS — Z87440 Personal history of urinary (tract) infections: Secondary | ICD-10-CM | POA: Insufficient documentation

## 2012-02-12 DIAGNOSIS — R109 Unspecified abdominal pain: Secondary | ICD-10-CM | POA: Insufficient documentation

## 2012-02-12 DIAGNOSIS — R110 Nausea: Secondary | ICD-10-CM | POA: Insufficient documentation

## 2012-02-12 LAB — CBC WITH DIFFERENTIAL/PLATELET
Basophils Absolute: 0 10*3/uL (ref 0.0–0.1)
Basophils Relative: 0 % (ref 0–1)
HCT: 39.1 % (ref 36.0–46.0)
MCHC: 33.5 g/dL (ref 30.0–36.0)
Monocytes Absolute: 0.3 10*3/uL (ref 0.1–1.0)
Neutro Abs: 3.4 10*3/uL (ref 1.7–7.7)
Neutrophils Relative %: 66 % (ref 43–77)
RDW: 13.6 % (ref 11.5–15.5)

## 2012-02-12 LAB — BASIC METABOLIC PANEL
Chloride: 101 mEq/L (ref 96–112)
Creatinine, Ser: 0.78 mg/dL (ref 0.50–1.10)
GFR calc Af Amer: >90 mL/min (ref >90)
GFR calc non Af Amer: >90 mL/min (ref >90)

## 2012-02-12 LAB — HCG, QUANTITATIVE, PREGNANCY: hCG, Beta Chain, Quant, S: <1 m[IU]/mL (ref <5)

## 2012-02-12 MED ORDER — METOCLOPRAMIDE HCL 5 MG/ML IJ SOLN
10.0000 mg | Freq: Once | INTRAMUSCULAR | Status: AC
  Administered 2012-02-12: 10 mg via INTRAVENOUS

## 2012-02-12 MED ORDER — SODIUM CHLORIDE 0.9 % IV SOLN
INTRAVENOUS | Status: DC
  Administered 2012-02-12: 1000 mL via INTRAVENOUS

## 2012-02-12 MED ORDER — METOCLOPRAMIDE HCL 5 MG/ML IJ SOLN
INTRAMUSCULAR | Status: AC
  Filled 2012-02-12: qty 2

## 2012-02-12 NOTE — Progress Notes (Signed)
Pt is a G11/P2, [facility] 4/23 ([id number]) @ [facility], c/o abdominal pain.  Central monitoring requested for this patient.  RN states difficulty maintaining continuous fetal heart rate.  Recommended repositioning patient to side-lying, on either side and changing position if cont difficulty.

## 2012-02-12 NOTE — ED Notes (Signed)
Pt wanting to leave, states she does not want to wait for blood results to determine the next test. Pt says she has been seen in [place] & dx with a positive pregnancy & will just go back there. Pt wanting to sign out AMA. Pt informed to return if she needs to return.

## 2012-02-12 NOTE — ED Notes (Signed)
Pt vomiting; EDP notified 

## 2012-02-12 NOTE — ED Notes (Signed)
Pt left AMA, stating did not want to wait on lab work to be completed & was going back to [place] to be seen.

## 2012-02-12 NOTE — ED Provider Notes (Signed)
History     CSN: [id number]  Arrival date & time 02/12/12  1916   First MD Initiated Contact with Patient 02/12/12 1929      Chief Complaint  Patient presents with  . Abdominal Pain  . Nausea    (Consider location/radiation/quality/duration/timing/severity/associated sxs/prior treatment) HPI  Past Medical History  Diagnosis Date  . Ovarian cyst   . Kidney stones   . UTI (lower urinary tract infection)   . Calcium oxalate renal stones   . Asthma     Past Surgical History  Procedure Date  . Ovarian cyst removal   . Lithotripsy   . Laparoscopic tubal ligation   . Cholecystectomy   . Appendectomy     History reviewed. No pertinent family history.  History  Substance Use Topics  . Smoking status: Current Every Day Smoker -- 0.5 packs/day    Types: Cigarettes  . Smokeless tobacco: Not on file  . Alcohol Use: No    OB History    Grav Para Term Preterm Abortions TAB SAB Ect Mult Living   1               Review of Systems  Allergies  Iohexol; Zofran; Ketorolac tromethamine; and Morphine and related  Home Medications   Current Outpatient Rx  Name  Route  Sig  Dispense  Refill  . ACETAMINOPHEN 500 MG PO TABS   Oral   Take 1,000 mg by mouth every 6 (six) hours as needed. Pain.         . CEPHALEXIN 500 MG PO CAPS   Oral   Take 1 capsule (500 mg total) by mouth 4 (four) times daily. For 7 days   28 capsule   0   . IBUPROFEN 800 MG PO TABS   Oral   Take 1 tablet (800 mg total) by mouth 3 (three) times daily.   21 tablet   0   . HYDROCODONE-ACETAMINOPHEN 5-325 MG PO TABS   Oral   Take 1-2 tablets by mouth every 4 (four) hours as needed. Take one-two tabs po q 4-6 hrs prn pain           BP 158/119  Pulse 106  Temp 97.9 F (36.6 C) (Oral)  Resp 18  Ht 5\' 4"  (1.626 m)  Wt 218 lb (98.884 kg)  BMI 37.42 kg/m2  SpO2 100%  LMP 08/02/2011  Physical Exam  ED Course  Procedures (including critical care time)   Labs Reviewed  CBC WITH  DIFFERENTIAL  BASIC METABOLIC PANEL  HCG, QUANTITATIVE, PREGNANCY  URINALYSIS, ROUTINE W REFLEX MICROSCOPIC  PREGNANCY, URINE   No results found.   No diagnosis found.    MDM  Patient presents to ER with complaints of abdominal pain. Patient claims to be pregnant. Review of her records reveals that she has had multiple urinary pregnancy test recently and are negative. Record also reflect a previous tubal ligation. Patient is complaining of fairly severe right lower abdominal pain. When asked her about her surgical history she reports that she has had previous appendectomy, although this is not reflected in the history.  Patient was asking for pain medication. She was told that she could not pain medication until we ascertain her pregnancy status. At this point patient became agitated and said that she wanted to go to dental [facility]. She refused further treatment here and left AGAINST MEDICAL ADVICE.        Gilda Crease, MD 02/12/12 2100

## 2012-02-12 NOTE — Progress Notes (Signed)
Ongoing difficulty tracing fetal heart tone reported by [employer].  Suggested ultrasound to determine fetal heart rate.  They will do pregnancy test prior to ultrasound.

## 2012-02-12 NOTE — ED Notes (Signed)
Pt left the dept. Offered a wheelchair pt refused. No acute distressed noted.

## 2012-02-12 NOTE — ED Notes (Addendum)
Pt co abdominal pain, lower center of abdomen, nausea denies vomiting/diarrhea. Pt is [redacted] weeks pregnant. Due in April.

## 2012-05-09 ENCOUNTER — Encounter (HOSPITAL_COMMUNITY): Payer: Self-pay | Admitting: *Deleted

## 2012-05-09 ENCOUNTER — Emergency Department (HOSPITAL_COMMUNITY): Payer: Medicaid Other

## 2012-05-09 ENCOUNTER — Emergency Department (HOSPITAL_COMMUNITY)
Admission: EM | Admit: 2012-05-09 | Discharge: 2012-05-09 | Disposition: A | Discharge status: Home / Self Care | Payer: Medicaid Other | Attending: Emergency Medicine | Admitting: Emergency Medicine

## 2012-05-09 DIAGNOSIS — Z8742 Personal history of other diseases of the female genital tract: Secondary | ICD-10-CM | POA: Insufficient documentation

## 2012-05-09 DIAGNOSIS — J45909 Unspecified asthma, uncomplicated: Secondary | ICD-10-CM | POA: Insufficient documentation

## 2012-05-09 DIAGNOSIS — F17200 Nicotine dependence, unspecified, uncomplicated: Secondary | ICD-10-CM | POA: Insufficient documentation

## 2012-05-09 DIAGNOSIS — R109 Unspecified abdominal pain: Secondary | ICD-10-CM

## 2012-05-09 DIAGNOSIS — Z87442 Personal history of urinary calculi: Secondary | ICD-10-CM | POA: Insufficient documentation

## 2012-05-09 DIAGNOSIS — Z87440 Personal history of urinary (tract) infections: Secondary | ICD-10-CM | POA: Insufficient documentation

## 2012-05-09 DIAGNOSIS — Z3202 Encounter for pregnancy test, result negative: Secondary | ICD-10-CM | POA: Insufficient documentation

## 2012-05-09 DIAGNOSIS — N390 Urinary tract infection, site not specified: Secondary | ICD-10-CM | POA: Insufficient documentation

## 2012-05-09 LAB — URINE MICROSCOPIC-ADD ON

## 2012-05-09 LAB — URINALYSIS, ROUTINE W REFLEX MICROSCOPIC
Bilirubin Urine: NEGATIVE
Ketones, ur: NEGATIVE mg/dL
Nitrite: NEGATIVE
Protein, ur: 100 mg/dL — AB
Specific Gravity, Urine: 1.02 (ref 1.005–1.030)
Urobilinogen, UA: 1 mg/dL (ref 0.0–1.0)

## 2012-05-09 MED ORDER — CIPROFLOXACIN HCL 500 MG PO TABS: 500.0000 mg | ORAL_TABLET | Freq: Two times a day (BID) | ORAL | Status: DC

## 2012-05-09 MED ORDER — OXYCODONE-ACETAMINOPHEN 5-325 MG PO TABS
2.0000 | ORAL_TABLET | Freq: Once | ORAL | Status: AC
  Administered 2012-05-09: 2 via ORAL
  Filled 2012-05-09: qty 2

## 2012-05-09 MED ORDER — IBUPROFEN 800 MG PO TABS: 800.0000 mg | ORAL_TABLET | Freq: Once | ORAL | Status: DC

## 2012-05-09 MED ORDER — OXYCODONE-ACETAMINOPHEN 5-325 MG PO TABS: 2.0000 | ORAL_TABLET | ORAL | Status: DC | PRN

## 2012-05-09 NOTE — ED Notes (Signed)
Unable to obtain blood, MD made aware.

## 2012-05-09 NOTE — ED Notes (Signed)
Pt refuses CT

## 2012-05-09 NOTE — ED Provider Notes (Signed)
History     CSN: [id number]  Arrival date & time 05/09/12  1317   First MD Initiated Contact with Patient 05/09/12 1704      Chief Complaint  Patient presents with  . Abdominal Pain    (Consider location/radiation/quality/duration/timing/severity/associated sxs/prior treatment) HPI Comments: Patient with history of renal calculi presents with severe pain in the bilateral flanks.  No radiation to the groin.  She has a history of "kidney stones" and tells me has been seen by [facility] in the past.  She also has chronic abdominal pain and has been seen here multiple times for the same.  She has had multiple workups that will usually only show calculi within the kidney itself but no obstructing stones.    Patient is a 27 y.o. female presenting with abdominal pain. The history is provided by the patient.  Abdominal Pain Pain location:  L flank and R flank Pain quality: cramping   Pain radiates to:  Does not radiate Pain severity:  Severe Onset quality:  Sudden Duration:  2 days Timing:  Constant Progression:  Worsening Chronicity:  Recurrent   Past Medical History  Diagnosis Date  . Ovarian cyst   . UTI (lower urinary tract infection)   . Asthma   . Kidney stones   . Calcium oxalate renal stones     Past Surgical History  Procedure Laterality Date  . Ovarian cyst removal    . Lithotripsy    . Laparoscopic tubal ligation    . Cholecystectomy    . Appendectomy      History reviewed. No pertinent family history.  History  Substance Use Topics  . Smoking status: Current Every Day Smoker -- 0.50 packs/day    Types: Cigarettes  . Smokeless tobacco: Not on file  . Alcohol Use: No    OB History   Grav Para Term Preterm Abortions TAB SAB Ect Mult Living   1               Review of Systems  Gastrointestinal: Positive for abdominal pain.  All other systems reviewed and are negative.    Allergies  Iohexol; Zofran; Ketorolac tromethamine; and Morphine and  related  Home Medications   Current Outpatient Rx  Name  Route  Sig  Dispense  Refill  . ibuprofen (ADVIL,MOTRIN) 800 MG tablet   Oral   Take 800 mg by mouth daily as needed for pain.           BP 133/114  Pulse 111  Temp(Src) 97.9 F (36.6 C) (Oral)  Resp 20  Ht 5\' 4"  (1.626 m)  Wt 207 lb (93.895 kg)  BMI 35.51 kg/m2  SpO2 100%  LMP 04/08/2012  Breastfeeding? Unknown  Physical Exam  Nursing note and vitals reviewed. Constitutional: She is oriented to person, place, and time. She appears well-developed and well-nourished.  She appears anxious, hyperventilating, and carrying on quite dramatically.    HENT:  Head: Normocephalic and atraumatic.  Mouth/Throat: Oropharynx is clear and moist.  Neck: Normal range of motion. Neck supple.  Cardiovascular: Normal rate and regular rhythm.   No murmur heard. Pulmonary/Chest: Effort normal and breath sounds normal. No respiratory distress. She has no wheezes.  Abdominal: Soft. Bowel sounds are normal. She exhibits no distension. There is no tenderness.  Musculoskeletal: Normal range of motion. She exhibits no edema.  Neurological: She is alert and oriented to person, place, and time.  Skin: Skin is warm and dry.    ED Course  Procedures (including critical  care time)  Labs Reviewed  CBC WITH DIFFERENTIAL  BASIC METABOLIC PANEL  URINALYSIS, ROUTINE W REFLEX MICROSCOPIC  PREGNANCY, URINE   No results found.   No diagnosis found.    MDM  The patient is refusing the ct scan as she is concerned about radiation.  The ua shows a uti for which I will prescribe cipro and percocet for her pain.  She needs to follow up with her urologist.          Geoffery Lyons, MD 05/09/12 [phone]

## 2012-05-09 NOTE — ED Notes (Signed)
No answer
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
## 2012-05-11 LAB — URINE CULTURE

## 2012-05-12 ENCOUNTER — Telehealth (HOSPITAL_COMMUNITY): Payer: Self-pay | Admitting: Emergency Medicine

## 2012-06-16 ENCOUNTER — Encounter (HOSPITAL_COMMUNITY): Payer: Self-pay

## 2012-06-16 ENCOUNTER — Emergency Department (HOSPITAL_COMMUNITY)
Admission: EM | Admit: 2012-06-16 | Discharge: 2012-06-17 | Disposition: A | Discharge status: Home / Self Care | Payer: Medicaid Other | Attending: Emergency Medicine | Admitting: Emergency Medicine

## 2012-06-16 DIAGNOSIS — Z3202 Encounter for pregnancy test, result negative: Secondary | ICD-10-CM | POA: Insufficient documentation

## 2012-06-16 DIAGNOSIS — J45909 Unspecified asthma, uncomplicated: Secondary | ICD-10-CM | POA: Insufficient documentation

## 2012-06-16 DIAGNOSIS — N939 Abnormal uterine and vaginal bleeding, unspecified: Secondary | ICD-10-CM

## 2012-06-16 DIAGNOSIS — Z87442 Personal history of urinary calculi: Secondary | ICD-10-CM | POA: Insufficient documentation

## 2012-06-16 DIAGNOSIS — F17200 Nicotine dependence, unspecified, uncomplicated: Secondary | ICD-10-CM | POA: Insufficient documentation

## 2012-06-16 DIAGNOSIS — Z8742 Personal history of other diseases of the female genital tract: Secondary | ICD-10-CM | POA: Insufficient documentation

## 2012-06-16 DIAGNOSIS — R109 Unspecified abdominal pain: Secondary | ICD-10-CM | POA: Insufficient documentation

## 2012-06-16 DIAGNOSIS — Z79899 Other long term (current) drug therapy: Secondary | ICD-10-CM | POA: Insufficient documentation

## 2012-06-16 DIAGNOSIS — Z87440 Personal history of urinary (tract) infections: Secondary | ICD-10-CM | POA: Insufficient documentation

## 2012-06-16 DIAGNOSIS — N898 Other specified noninflammatory disorders of vagina: Secondary | ICD-10-CM | POA: Insufficient documentation

## 2012-06-16 NOTE — ED Notes (Signed)
Pt reports heavy bleeding that started this am and has persisted through the day with passing some clots, also lower abd pain

## 2012-06-17 ENCOUNTER — Ambulatory Visit (HOSPITAL_COMMUNITY): Admit: 2012-06-17 | Payer: Medicaid Other

## 2012-06-17 LAB — CBC WITH DIFFERENTIAL/PLATELET
Basophils Relative: 0 % (ref 0–1)
Eosinophils Absolute: 0.1 10*3/uL (ref 0.0–0.7)
HCT: 35.2 % — ABNORMAL LOW (ref 36.0–46.0)
Lymphs Abs: 2.7 10*3/uL (ref 0.7–4.0)
MCH: 30.1 pg (ref 26.0–34.0)
MCV: 86.3 fL (ref 78.0–100.0)
Monocytes Absolute: 0.3 10*3/uL (ref 0.1–1.0)
Neutrophils Relative %: 51 % (ref 43–77)
RDW: 13.5 % (ref 11.5–15.5)
Smear Review: ADEQUATE

## 2012-06-17 LAB — URINALYSIS, ROUTINE W REFLEX MICROSCOPIC
Bilirubin Urine: NEGATIVE
Glucose, UA: NEGATIVE mg/dL
Ketones, ur: NEGATIVE mg/dL
Protein, ur: NEGATIVE mg/dL
Urobilinogen, UA: 0.2 mg/dL (ref 0.0–1.0)

## 2012-06-17 LAB — URINE MICROSCOPIC-ADD ON

## 2012-06-17 MED ORDER — OXYCODONE-ACETAMINOPHEN 5-325 MG PO TABS: 1.0000 | ORAL_TABLET | Freq: Four times a day (QID) | ORAL | Status: DC | PRN

## 2012-06-17 MED ORDER — HYDROCODONE-ACETAMINOPHEN 5-325 MG PO TABS
2.0000 | ORAL_TABLET | Freq: Once | ORAL | Status: AC
  Administered 2012-06-17: 2 via ORAL
  Filled 2012-06-17: qty 2

## 2012-06-17 MED ORDER — IBUPROFEN 800 MG PO TABS
800.0000 mg | ORAL_TABLET | Freq: Once | ORAL | Status: AC
  Administered 2012-06-17: 800 mg via ORAL
  Filled 2012-06-17: qty 1

## 2012-06-17 MED ORDER — PROMETHAZINE HCL 12.5 MG PO TABS
12.5000 mg | ORAL_TABLET | Freq: Once | ORAL | Status: AC
  Administered 2012-06-17: 12.5 mg via ORAL
  Filled 2012-06-17: qty 1

## 2012-06-17 MED ORDER — OXYCODONE-ACETAMINOPHEN 5-325 MG PO TABS
1.0000 | ORAL_TABLET | Freq: Once | ORAL | Status: AC
  Administered 2012-06-17: 1 via ORAL
  Filled 2012-06-17: qty 1

## 2012-06-17 MED ORDER — IBUPROFEN 800 MG PO TABS: 800.0000 mg | ORAL_TABLET | Freq: Two times a day (BID) | ORAL | Status: DC

## 2012-06-17 NOTE — ED Provider Notes (Signed)
Medical screening examination/treatment/procedure(s) were performed by non-physician practitioner and as supervising physician I was immediately available for consultation/collaboration.  Kumari Sculley S. Dornell Grasmick, MD 06/17/12 0448 

## 2012-06-17 NOTE — ED Provider Notes (Signed)
History     CSN: [id number]  Arrival date & time 06/16/12  2310   First MD Initiated Contact with Patient 06/16/12 2334      Chief Complaint  Patient presents with  . Vaginal Bleeding    (Consider location/radiation/quality/duration/timing/severity/associated sxs/prior treatment) Patient is a 27 y.o. female presenting with vaginal bleeding.  Vaginal Bleeding    Past Medical History  Diagnosis Date  . Ovarian cyst   . UTI (lower urinary tract infection)   . Asthma   . Kidney stones   . Calcium oxalate renal stones     Past Surgical History  Procedure Laterality Date  . Ovarian cyst removal    . Lithotripsy    . Laparoscopic tubal ligation    . Cholecystectomy    . Appendectomy      No family history on file.  History  Substance Use Topics  . Smoking status: Current Every Day Smoker -- 0.50 packs/day    Types: Cigarettes  . Smokeless tobacco: Not on file  . Alcohol Use: No    OB History   Grav Para Term Preterm Abortions TAB SAB Ect Mult Living   1               Review of Systems  Genitourinary: Positive for vaginal bleeding.    Allergies  Iohexol; Zofran; Ketorolac tromethamine; and Morphine and related  Home Medications   Current Outpatient Rx  Name  Route  Sig  Dispense  Refill  . citalopram (CELEXA) 20 MG tablet   Oral   Take 20 mg by mouth daily.         Marland Kitchen levofloxacin (LEVAQUIN) 750 MG tablet   Oral   Take 750 mg by mouth daily.         . metroNIDAZOLE (FLAGYL) 500 MG tablet   Oral   Take 500 mg by mouth 3 (three) times daily.         . tamsulosin (FLOMAX) 0.4 MG CAPS   Oral   Take 0.4 mg by mouth daily.         . ciprofloxacin (CIPRO) 500 MG tablet   Oral   Take 1 tablet (500 mg total) by mouth every 12 (twelve) hours.   10 tablet   0   . ibuprofen (ADVIL,MOTRIN) 800 MG tablet   Oral   Take 800 mg by mouth daily as needed for pain.         Marland Kitchen oxyCODONE-acetaminophen (PERCOCET) 5-325 MG per tablet   Oral   Take 2  tablets by mouth every 4 (four) hours as needed for pain.   12 tablet   0     BP 134/102  Pulse 92  Temp(Src) 98.3 F (36.8 C) (Oral)  Resp 20  Ht 5\' 4"  (1.626 m)  Wt 207 lb (93.895 kg)  BMI 35.51 kg/m2  SpO2 100%  Physical Exam  Nursing note and vitals reviewed. Constitutional: She is oriented to person, place, and time. She appears well-developed and well-nourished.  Non-toxic appearance.  Pt is tearful upon arrival.  HENT:  Head: Normocephalic.  Right Ear: Tympanic membrane and external ear normal.  Left Ear: Tympanic membrane and external ear normal.  Eyes: EOM and lids are normal. Pupils are equal, round, and reactive to light.  Neck: Normal range of motion. Neck supple. Carotid bruit is not present.  Cardiovascular: Normal rate, regular rhythm, normal heart sounds, intact distal pulses and normal pulses.   Pulmonary/Chest: Breath sounds normal. No respiratory distress.  Left CVAT. Suprapubic  area tenderness.  Abdominal: Soft. Bowel sounds are normal. There is no tenderness. There is no guarding.  Musculoskeletal: Normal range of motion.  Lymphadenopathy:       Head (right side): No submandibular adenopathy present.       Head (left side): No submandibular adenopathy present.    She has no cervical adenopathy.  Neurological: She is alert and oriented to person, place, and time. She has normal strength. No cranial nerve deficit or sensory deficit.  Skin: Skin is warm and dry.  Psychiatric: She has a normal mood and affect. Her speech is normal.    ED Course  Procedures (including critical care time)  Labs Reviewed - No data to display No results found.   No diagnosis found.    MDM  I have reviewed nursing notes, vital signs, and all appropriate lab and imaging results for this patient. Pt presents to ED with c/o heavy bleeding and lower abd pain. No hx of injury or trauma. UA reveals few bacteria and Sp Grav - >1.030. CBC wnl. Preg test negative.  Plan - will  obtain pelvic ultra sound. Rx for ibuprofen and percocet. Gyn referral suggested.  I have reviewed previous CT scans and pelvic ultra sounds.       Kathie Dike, PA-C 06/17/12 [phone]

## 2012-06-17 NOTE — ED Notes (Signed)
Patient given appointment time of 1:15 for transvaginal ultrasound. Instructed patient to arrive to outpatient department at 1:00 and to drink approximately 16 oz of water and not empty her bladder 30-60 minutes prior to arrival for scan. Patient verbalized understanding. Instructions written on patient's paperwork as well.

## 2012-07-14 ENCOUNTER — Encounter (HOSPITAL_COMMUNITY): Payer: Self-pay | Admitting: Emergency Medicine

## 2012-07-14 ENCOUNTER — Emergency Department (HOSPITAL_COMMUNITY)
Admission: EM | Admit: 2012-07-14 | Discharge: 2012-07-14 | Disposition: A | Discharge status: Home / Self Care | Payer: Medicaid Other | Attending: Emergency Medicine | Admitting: Emergency Medicine

## 2012-07-14 DIAGNOSIS — J45909 Unspecified asthma, uncomplicated: Secondary | ICD-10-CM | POA: Insufficient documentation

## 2012-07-14 DIAGNOSIS — F17200 Nicotine dependence, unspecified, uncomplicated: Secondary | ICD-10-CM | POA: Insufficient documentation

## 2012-07-14 DIAGNOSIS — Z9851 Tubal ligation status: Secondary | ICD-10-CM | POA: Insufficient documentation

## 2012-07-14 DIAGNOSIS — R112 Nausea with vomiting, unspecified: Secondary | ICD-10-CM | POA: Insufficient documentation

## 2012-07-14 DIAGNOSIS — R109 Unspecified abdominal pain: Secondary | ICD-10-CM | POA: Insufficient documentation

## 2012-07-14 DIAGNOSIS — Z87442 Personal history of urinary calculi: Secondary | ICD-10-CM | POA: Insufficient documentation

## 2012-07-14 DIAGNOSIS — Z87440 Personal history of urinary (tract) infections: Secondary | ICD-10-CM | POA: Insufficient documentation

## 2012-07-14 DIAGNOSIS — Z9089 Acquired absence of other organs: Secondary | ICD-10-CM | POA: Insufficient documentation

## 2012-07-14 DIAGNOSIS — Z9889 Other specified postprocedural states: Secondary | ICD-10-CM | POA: Insufficient documentation

## 2012-07-14 DIAGNOSIS — J039 Acute tonsillitis, unspecified: Secondary | ICD-10-CM | POA: Insufficient documentation

## 2012-07-14 DIAGNOSIS — Z8742 Personal history of other diseases of the female genital tract: Secondary | ICD-10-CM | POA: Insufficient documentation

## 2012-07-14 DIAGNOSIS — R509 Fever, unspecified: Secondary | ICD-10-CM | POA: Insufficient documentation

## 2012-07-14 LAB — RAPID STREP SCREEN (MED CTR MEBANE ONLY): Streptococcus, Group A Screen (Direct): POSITIVE — AB

## 2012-07-14 MED ORDER — FENTANYL CITRATE 0.05 MG/ML IJ SOLN
50.0000 ug | Freq: Once | INTRAMUSCULAR | Status: AC
  Administered 2012-07-14: 25 ug via INTRAVENOUS
  Filled 2012-07-14: qty 2

## 2012-07-14 MED ORDER — METOCLOPRAMIDE HCL 5 MG/ML IJ SOLN
10.0000 mg | Freq: Once | INTRAMUSCULAR | Status: AC
  Administered 2012-07-14: 10 mg via INTRAVENOUS
  Filled 2012-07-14: qty 2

## 2012-07-14 MED ORDER — SODIUM CHLORIDE 0.9 % IV SOLN: 1000.0000 mL | Freq: Once | INTRAVENOUS | Status: DC

## 2012-07-14 MED ORDER — HYDROCODONE-ACETAMINOPHEN 10-300 MG/15ML PO SOLN: ORAL | Status: DC

## 2012-07-14 MED ORDER — DEXAMETHASONE SODIUM PHOSPHATE 4 MG/ML IJ SOLN
4.0000 mg | Freq: Once | INTRAMUSCULAR | Status: AC
  Administered 2012-07-14: 4 mg via INTRAVENOUS
  Filled 2012-07-14: qty 1

## 2012-07-14 MED ORDER — CLINDAMYCIN PHOSPHATE 900 MG/50ML IV SOLN
900.0000 mg | Freq: Once | INTRAVENOUS | Status: AC
  Administered 2012-07-14: 900 mg via INTRAVENOUS
  Filled 2012-07-14: qty 50

## 2012-07-14 MED ORDER — SODIUM CHLORIDE 0.9 % IV SOLN
1000.0000 mL | INTRAVENOUS | Status: DC
  Administered 2012-07-14: 1000 mL via INTRAVENOUS

## 2012-07-14 MED ORDER — SODIUM CHLORIDE 0.9 % IV SOLN
1000.0000 mL | Freq: Once | INTRAVENOUS | Status: AC
  Administered 2012-07-14: 1000 mL via INTRAVENOUS

## 2012-07-14 MED ORDER — DIPHENHYDRAMINE HCL 50 MG/ML IJ SOLN
25.0000 mg | Freq: Once | INTRAMUSCULAR | Status: AC
  Administered 2012-07-14: 13:00:00 via INTRAVENOUS
  Filled 2012-07-14: qty 1

## 2012-07-14 MED ORDER — DEXTROSE 5 % IV SOLN: 900.0000 mg | Freq: Once | INTRAVENOUS | Status: DC

## 2012-07-14 MED ORDER — PENICILLIN V POTASSIUM 500 MG PO TABS: 500.0000 mg | ORAL_TABLET | Freq: Four times a day (QID) | ORAL | Status: DC

## 2012-07-14 NOTE — ED Notes (Signed)
Patient states that she started having a sore throat a couple of days ago, states that her daughter was diagnosed with strep throat 3-4 days ago.  The patient states that she is coughing up blood, states that she became hoarse this morning.  States that she developed a cough this morning.

## 2012-07-14 NOTE — ED Notes (Signed)
Patient c/o sore throat and left upper quad abd pain. Denies any vomiting but reports "spitting up " small amount of blood. Patient denies diarrhea. Reports fever. Patient's voice hoarse.

## 2012-07-14 NOTE — ED Provider Notes (Signed)
History  This chart was scribed for Ward Givens, MD by Bennett Scrape, ED Scribe. This patient was seen in room APA12/APA12 and the patient's care was started at 10:32 AM.  CSN: [id number]  Arrival date & time 07/14/12  0939   First MD Initiated Contact with Patient 07/14/12 1032      Chief Complaint  Patient presents with  . Sore Throat  . Abdominal Pain  . Fever     Patient is a 27 y.o. female presenting with pharyngitis. The history is provided by the patient. No language interpreter was used.  Sore Throat This is a new problem. The current episode started 2 days ago. The problem occurs constantly. The problem has been gradually worsening. Associated symptoms include abdominal pain. Pertinent negatives include no chest pain, no headaches and no shortness of breath. The symptoms are aggravated by swallowing. Nothing relieves the symptoms. She has tried nothing for the symptoms.    HPI Comments: [name] is a 27 y.o. female who presents to the Emergency Department complaining of 2 days of gradual onset, gradually worsening, constant sore throat with associated fever of 102.6, chills, nausea and lower abdominal pain. She states that swallowing worsens the pain and she has had a decreased appetite secondary to pain. She reports that her step daughter had strep throat 5 days ago. She was seen for a sore throat with milder symptoms in October 2013 that was diagnosed as tonsillitis. She denies that sore throats are frequent symptoms of hers. She denies emesis, otalgia, rhinorrhea and diarrhea as associated symptoms. Pt is a current 0.5 ppd everyday smoker but denies alcohol use.   PCP is Dr. Ihor Austin  Past Medical History  Diagnosis Date  . Ovarian cyst   . UTI (lower urinary tract infection)   . Asthma   . Kidney stones   . Calcium oxalate renal stones     Past Surgical History  Procedure Laterality Date  . Ovarian cyst removal    . Lithotripsy    . Laparoscopic tubal  ligation    . Cholecystectomy    . Appendectomy      Family History  Problem Relation Age of Onset  . Diabetes Other   . Seizures Other   . Cancer Other     History  Substance Use Topics  . Smoking status: Current Every Day Smoker -- 0.50 packs/day for 14 years    Types: Cigarettes  . Smokeless tobacco: Never Used  . Alcohol Use: No  works as a Conservation officer, [employer]  OB History   Grav Para Term Preterm Abortions TAB SAB Ect Mult Living   13 2 1 1 10  9 1  2       Review of Systems  Constitutional: Positive for fever and chills. Negative for diaphoresis.  HENT: Positive for sore throat. Negative for congestion.   Respiratory: Negative for shortness of breath.   Cardiovascular: Negative for chest pain.  Gastrointestinal: Positive for nausea and abdominal pain. Negative for vomiting and diarrhea.  Neurological: Negative for headaches.  All other systems reviewed and are negative.    Allergies  Iohexol; Zofran-makes the pt vomit; Ketorolac tromethamine; and Morphine and related-breaks out in hives per pt  Home Medications   Current Outpatient Rx  Name  Route  Sig  Dispense  Refill  . diphenhydrAMINE (BENADRYL) 25 MG tablet   Oral   Take 25 mg by mouth every 6 (six) hours as needed for itching or allergies.         Marland Kitchen  ibuprofen (ADVIL,MOTRIN) 800 MG tablet   Oral   Take 800 mg by mouth daily as needed for pain.           Triage Vitals: BP 152/93  Pulse 99  Temp(Src) 98.9 F (37.2 C) (Oral)  Resp 20  Ht 5\' 4"  (1.626 m)  Wt 214 lb (97.07 kg)  BMI 36.72 kg/m2  SpO2 100%  LMP 07/14/2012  Vital signs normal    Physical Exam  Nursing note and vitals reviewed. Constitutional: She is oriented to person, place, and time. She appears well-developed and well-nourished.  Non-toxic appearance. She does not appear ill. No distress.  HENT:  Head: Normocephalic and atraumatic.  Right Ear: External ear normal.  Left Ear: External ear normal.  Nose: Nose normal. No  mucosal edema or rhinorrhea.  Mouth/Throat: Mucous membranes are normal. No dental abscesses or edematous. Posterior oropharyngeal erythema present.  Tonsils are diffusely red, inflamed and mildly swollen  Eyes: Conjunctivae and EOM are normal. Pupils are equal, round, and reactive to light.  Neck: Normal range of motion and full passive range of motion without pain. Neck supple.  Tender diffusely to the neck, no obvious lymphadenopathy   Cardiovascular: Normal rate, regular rhythm and normal heart sounds.  Exam reveals no gallop and no friction rub.   No murmur heard. Pulmonary/Chest: Effort normal and breath sounds normal. No respiratory distress. She has no wheezes. She has no rhonchi. She has no rales. She exhibits no tenderness and no crepitus.  Abdominal: Soft. Normal appearance and bowel sounds are normal. She exhibits no distension. Tenderness: RLQ tenderness. There is no rebound and no guarding.    Mildly tender in the RLQ c/w mesenteric adenitis with strep infection  Musculoskeletal: Normal range of motion. She exhibits no edema and no tenderness.  Moves all extremities well.   Neurological: She is alert and oriented to person, place, and time. She has normal strength. No cranial nerve deficit.  Skin: Skin is warm, dry and intact. No rash noted. No erythema. No pallor.  Psychiatric: She has a normal mood and affect. Her speech is normal and behavior is normal. Her mood appears not anxious.    ED Course  Procedures (including critical care time)  Medications  0.9 %  sodium chloride infusion (1,000 mLs Intravenous New Bag/Given 07/14/12 1400)    Followed by  0.9 %  sodium chloride infusion (not administered)    Followed by  0.9 %  sodium chloride infusion (0 mLs Intravenous Stopped 07/14/12 1510)  dexamethasone (DECADRON) injection 4 mg (4 mg Intravenous Given 07/14/12 1301)  fentaNYL (SUBLIMAZE) injection 50 mcg (25 mcg Intravenous Given 07/14/12 1259)  metoCLOPramide (REGLAN)  injection 10 mg (10 mg Intravenous Given 07/14/12 1303)  diphenhydrAMINE (BENADRYL) injection 25 mg ( Intravenous Given 07/14/12 1304)  clindamycin (CLEOCIN) IVPB 900 mg (0 mg Intravenous Stopped 07/14/12 1335)   DIAGNOSTIC STUDIES: Oxygen Saturation is 100% on room air, normal by my interpretation.    COORDINATION OF CARE: 11:24 AM-Informed pt of positive strep test Discussed discharge plan with pt at bedside and pt agreed to plan. Also advised pt to f/u with ENT referral and addressed symptoms to return to the ED for.  12:43 PM-Pt rechecked and is receiving IV fluids. Patient's ED visit prolonged because she has a small gauge catheter in her wrist and her IV fluids could not be given any faster.  Review of patient's prior visits show she has history of chronic abdominal pain with multiple visits in the past. On review  of the [place] controlled substance site patient received 7 narcotic prescriptions in the last 6 months. She received #25 hydrocodone 10/325 on May 5 from [facility], #20 oxycodone 5/325 on May 9 from our emergency department, and #40 oxycodone 10/325 on April 15 from her PCP  Results for orders placed during the hospital encounter of 07/14/12  RAPID STREP SCREEN      Result Value Range   Streptococcus, Group A Screen (Direct) POSITIVE (*) NEGATIVE      1. Tonsillitis     New Prescriptions   HYDROCODONE-ACETAMINOPHEN (LORTAB) 10-300 MG/15ML SOLN    Take 2-3 tsp po every 6 hrs for pain   PENICILLIN V POTASSIUM (VEETID) 500 MG TABLET    Take 1 tablet (500 mg total) by mouth 4 (four) times daily.    Plan discharge  Devoria Albe, MD, FACEP   MDM   I personally performed the services described in this documentation, which was scribed in my presence. The recorded information has been reviewed and considered.  Devoria Albe, MD, Armando Gang    Ward Givens, MD 07/14/12 1539

## 2012-08-16 ENCOUNTER — Emergency Department (HOSPITAL_COMMUNITY)
Admission: EM | Admit: 2012-08-16 | Discharge: 2012-08-16 | Disposition: A | Discharge status: Home / Self Care | Payer: Medicaid Other | Attending: Emergency Medicine | Admitting: Emergency Medicine

## 2012-08-16 ENCOUNTER — Encounter (HOSPITAL_COMMUNITY): Payer: Self-pay | Admitting: *Deleted

## 2012-08-16 DIAGNOSIS — N760 Acute vaginitis: Secondary | ICD-10-CM | POA: Insufficient documentation

## 2012-08-16 DIAGNOSIS — N949 Unspecified condition associated with female genital organs and menstrual cycle: Secondary | ICD-10-CM | POA: Insufficient documentation

## 2012-08-16 DIAGNOSIS — G8929 Other chronic pain: Secondary | ICD-10-CM | POA: Insufficient documentation

## 2012-08-16 DIAGNOSIS — Z3202 Encounter for pregnancy test, result negative: Secondary | ICD-10-CM | POA: Insufficient documentation

## 2012-08-16 HISTORY — DX: Pelvic and perineal pain: R10.2

## 2012-08-16 HISTORY — DX: Other chronic pain: G89.29

## 2012-08-16 LAB — WET PREP, GENITAL
Trich, Wet Prep: NONE SEEN
Yeast Wet Prep HPF POC: NONE SEEN

## 2012-08-16 LAB — URINE MICROSCOPIC-ADD ON

## 2012-08-16 LAB — CBC WITH DIFFERENTIAL/PLATELET
Basophils Absolute: 0 10*3/uL (ref 0.0–0.1)
Eosinophils Absolute: 0 10*3/uL (ref 0.0–0.7)
Eosinophils Relative: 0 % (ref 0–5)
Lymphocytes Relative: 27 % (ref 12–46)
Lymphs Abs: 1.5 10*3/uL (ref 0.7–4.0)
MCH: 30.2 pg (ref 26.0–34.0)
MCV: 90.2 fL (ref 78.0–100.0)
Neutrophils Relative %: 68 % (ref 43–77)
Platelets: 286 10*3/uL (ref 150–400)
RBC: 4.41 MIL/uL (ref 3.87–5.11)
RDW: 13.3 % (ref 11.5–15.5)
WBC: 5.4 10*3/uL (ref 4.0–10.5)

## 2012-08-16 LAB — URINALYSIS, ROUTINE W REFLEX MICROSCOPIC
Ketones, ur: NEGATIVE mg/dL
Leukocytes, UA: NEGATIVE
Nitrite: NEGATIVE
Protein, ur: NEGATIVE mg/dL

## 2012-08-16 LAB — POCT PREGNANCY, URINE: Preg Test, Ur: NEGATIVE

## 2012-08-16 LAB — BASIC METABOLIC PANEL
Calcium: 9.6 mg/dL (ref 8.4–10.5)
Creatinine, Ser: 0.79 mg/dL (ref 0.50–1.10)
GFR calc Af Amer: >90 mL/min (ref >90)
GFR calc non Af Amer: >90 mL/min (ref >90)
Sodium: 139 mEq/L (ref 135–145)

## 2012-08-16 MED ORDER — HYDROCODONE-ACETAMINOPHEN 5-325 MG PO TABS: ORAL_TABLET | ORAL | Status: DC

## 2012-08-16 MED ORDER — NAPROXEN 250 MG PO TABS: 250.0000 mg | ORAL_TABLET | Freq: Two times a day (BID) | ORAL | Status: DC

## 2012-08-16 MED ORDER — OXYCODONE-ACETAMINOPHEN 5-325 MG PO TABS
2.0000 | ORAL_TABLET | Freq: Once | ORAL | Status: AC
  Administered 2012-08-16: 2 via ORAL
  Filled 2012-08-16: qty 2

## 2012-08-16 MED ORDER — METRONIDAZOLE 500 MG PO TABS: 500.0000 mg | ORAL_TABLET | Freq: Two times a day (BID) | ORAL | Status: DC

## 2012-08-16 MED ORDER — HYDROMORPHONE HCL PF 1 MG/ML IJ SOLN
1.0000 mg | Freq: Once | INTRAMUSCULAR | Status: AC
  Administered 2012-08-16: 1 mg via INTRAMUSCULAR
  Filled 2012-08-16: qty 1

## 2012-08-16 NOTE — ED Notes (Signed)
Patient crying, states pain is no better.  Plan of care discussed with patient, discussed pain medication time frame with patient.

## 2012-08-16 NOTE — ED Notes (Signed)
Lab at the bedside 

## 2012-08-16 NOTE — ED Provider Notes (Signed)
History    CSN: [id number] Arrival date & time 08/16/12  1478  First MD Initiated Contact with Patient 08/16/12 0530     Chief Complaint  Patient presents with  . Abdominal Pain   (Consider location/radiation/quality/duration/timing/severity/associated sxs/prior Treatment) HPI Comments: 27 year old female with a history of ovarian cyst, urinary tract infection and kidney stones who states that she has had a laparoscopic tubal ligation. She presents with a complaint of pelvic pain located in the suprapubic and left lower quadrant. This started several days ago and comes after having over 2 weeks of daily vaginal bleeding. She states that she has soaked 3 pads per day for the last 3 days. Nothing seems to make this better or worse, she has taken hydrocodone and 800 mg of ibuprofen without relief, no associated nausea or vomiting fevers or chills. She denies hematuria or diarrhea but does endorse ongoing vaginal bleeding. This is frustrating for her since she has a history of being very predictable with her periods.  Review of the medical records shows that over the last several years the patient has had multiple CT scans and multiple ultrasounds as to which have not detected any significant abnormalities. She did have an isolated 3 mm kidney stone on her prior CAT scan. She also had a prior ultrasound showing a small ovarian cyst.  Patient is a 27 y.o. female presenting with abdominal pain. The history is provided by the patient.  Abdominal Pain Associated symptoms include abdominal pain.   Past Medical History  Diagnosis Date  . Ovarian cyst   . UTI (lower urinary tract infection)   . Asthma   . Kidney stones   . Calcium oxalate renal stones    Past Surgical History  Procedure Laterality Date  . Ovarian cyst removal    . Lithotripsy    . Laparoscopic tubal ligation    . Cholecystectomy    . Appendectomy     Family History  Problem Relation Age of Onset  . Diabetes Other   .  Seizures Other   . Cancer Other    History  Substance Use Topics  . Smoking status: Current Every Day Smoker -- 0.50 packs/day for 14 years    Types: Cigarettes  . Smokeless tobacco: Never Used  . Alcohol Use: No   OB History   Grav Para Term Preterm Abortions TAB SAB Ect Mult Living   13 2 1 1 10  9 1  2      Review of Systems  Gastrointestinal: Positive for abdominal pain.  All other systems reviewed and are negative.    Allergies  Iohexol; Zofran; Ketorolac tromethamine; and Morphine and related  Home Medications   Current Outpatient Rx  Name  Route  Sig  Dispense  Refill  . diphenhydrAMINE (BENADRYL) 25 MG tablet   Oral   Take 25 mg by mouth every 6 (six) hours as needed for itching or allergies.         . Hydrocodone-Acetaminophen (LORTAB) 10-300 MG/15ML SOLN      Take 2-3 tsp po every 6 hrs for pain   120 mL   0   . ibuprofen (ADVIL,MOTRIN) 800 MG tablet   Oral   Take 800 mg by mouth daily as needed for pain.         Marland Kitchen zolpidem (AMBIEN) 5 MG tablet   Oral   Take 5 mg by mouth at bedtime as needed for sleep.         . penicillin v potassium (VEETID)  500 MG tablet   Oral   Take 1 tablet (500 mg total) by mouth 4 (four) times daily.   40 tablet   0    BP 155/109  Pulse 110  Temp(Src) 97.9 F (36.6 C) (Oral)  Resp 22  Ht 5\' 4"  (1.626 m)  Wt 207 lb (93.895 kg)  BMI 35.51 kg/m2  SpO2 99%  LMP 07/31/2012 Physical Exam  Nursing note and vitals reviewed. Constitutional: She appears well-developed and well-nourished. No distress.  HENT:  Head: Normocephalic and atraumatic.  Mouth/Throat: Oropharynx is clear and moist. No oropharyngeal exudate.  Eyes: Conjunctivae and EOM are normal. Pupils are equal, round, and reactive to light. Right eye exhibits no discharge. Left eye exhibits no discharge. No scleral icterus.  Neck: Normal range of motion. Neck supple. No JVD present. No thyromegaly present.  Cardiovascular: Normal rate, regular rhythm,  normal heart sounds and intact distal pulses.  Exam reveals no gallop and no friction rub.   No murmur heard. Pulmonary/Chest: Effort normal and breath sounds normal. No respiratory distress. She has no wheezes. She has no rales.  Abdominal: Soft. Bowel sounds are normal. She exhibits no distension and no mass. There is tenderness ( Suprapubic tenderness, mild guarding, no pain at McBurney's point, no upper abdominal tenderness, no peritoneal signs).  Genitourinary:  Chaperone present for pelvic exam, dark red blood from the vaginal os which is closed, no cervical motion tenderness, no adnexal tenderness or masses, mild uterine tenderness. Normal appearing external genitalia  Musculoskeletal: Normal range of motion. She exhibits no edema and no tenderness.  Lymphadenopathy:    She has no cervical adenopathy.  Neurological: She is alert. Coordination normal.  Skin: Skin is warm and dry. No rash noted. No erythema.  Psychiatric: She has a normal mood and affect. Her behavior is normal.    ED Course  Procedures (including critical care time) Labs Reviewed  GC/CHLAMYDIA PROBE AMP  WET PREP, GENITAL  CBC WITH DIFFERENTIAL  URINALYSIS, ROUTINE W REFLEX MICROSCOPIC  BASIC METABOLIC PANEL   No results found. No diagnosis found.  MDM  The patient appears uncomfortable, it is possible that she has a kidney stone, her CAT scans in the past have shown that she has intrarenal calculi. This also could be ovarian cyst, uterine fibroids, irregular menses. She denies using hormone medications including birth control pills. Will obtain a pelvic exam, lab work, pain medications have been ordered. The patient has an NSAID allergy to Toradol thus intramuscular opiate medications have been ordered  There have been greater than 10 CT's in the last 2 years and > 10 [place] in the last 18 months.  She has had recurrent kidney stones and pelvic pain thought to be a combination of both gynecologic as well as renal  etiology - Her frustration is that by the time she gets to the specialist the pain has gone away and she feels undiagnosed.  Vaginal exam with blood but no adnexal ttp.    Change of shift - care signed out to Dr. Orpah Cobb, MD 08/22/12 1056

## 2012-08-16 NOTE — ED Provider Notes (Signed)
Received at change of shift. 27yo F, c/o acute flair of her chronic pelvic "pain" for the past several days. Has been associated with vaginal bleeding "x2 weeks." Has had same pain for the past several years and endorses no change from her usual chronic pain pattern. Has had multiple ED visits for same with multiple reassuring CT scans and pelvic US over the past 1-2 years. Will not re-image today. VSS, afebrile, CTA, RRR, +suprapubic TTP.  +BV; will tx. No clear UTI; UC pending. Dx and testing d/w pt.  Questions answered.  Verb understanding, agreeable to d/c home. Strongly encouraged to f/u with OB/GYN for good continuity of care and control of her chronic pain. Verb understanding.     Results for orders placed during the hospital encounter of 08/16/12  WET PREP, GENITAL      Result Value Range   Yeast Wet Prep HPF POC NONE SEEN  NONE SEEN   Trich, Wet Prep NONE SEEN  NONE SEEN   Clue Cells Wet Prep HPF POC RARE (*) NONE SEEN   WBC, Wet Prep HPF POC NONE SEEN  NONE SEEN  CBC WITH DIFFERENTIAL      Result Value Range   WBC 5.4  4.0 - 10.5 K/uL   RBC 4.41  3.87 - 5.11 MIL/uL   Hemoglobin 13.3  12.0 - 15.0 g/dL   HCT 16.1  09.6 - 04.5 %   MCV 90.2  78.0 - 100.0 fL   MCH 30.2  26.0 - 34.0 pg   MCHC 33.4  30.0 - 36.0 g/dL   RDW 40.9  81.1 - 91.4 %   Platelets 286  150 - 400 K/uL   Neutrophils Relative % 68  43 - 77 %   Neutro Abs 3.7  1.7 - 7.7 K/uL   Lymphocytes Relative 27  12 - 46 %   Lymphs Abs 1.5  0.7 - 4.0 K/uL   Monocytes Relative 4  3 - 12 %   Monocytes Absolute 0.2  0.1 - 1.0 K/uL   Eosinophils Relative 0  0 - 5 %   Eosinophils Absolute 0.0  0.0 - 0.7 K/uL   Basophils Relative 0  0 - 1 %   Basophils Absolute 0.0  0.0 - 0.1 K/uL  URINALYSIS, ROUTINE W REFLEX MICROSCOPIC      Result Value Range   Color, Urine YELLOW  YELLOW   APPearance CLEAR  CLEAR   Specific Gravity, Urine 1.015  1.005 - 1.030   pH 7.0  5.0 - 8.0   Glucose, UA NEGATIVE  NEGATIVE mg/dL   Hgb urine  dipstick LARGE (*) NEGATIVE   Bilirubin Urine NEGATIVE  NEGATIVE   Ketones, ur NEGATIVE  NEGATIVE mg/dL   Protein, ur NEGATIVE  NEGATIVE mg/dL   Urobilinogen, UA 0.2  0.0 - 1.0 mg/dL   Nitrite NEGATIVE  NEGATIVE   Leukocytes, UA NEGATIVE  NEGATIVE  BASIC METABOLIC PANEL      Result Value Range   Sodium 139  135 - 145 mEq/L   Potassium 3.7  3.5 - 5.1 mEq/L   Chloride 105  96 - 112 mEq/L   CO2 25  19 - 32 mEq/L   Glucose, Bld 109 (*) 70 - 99 mg/dL   BUN 7  6 - 23 mg/dL   Creatinine, Ser 7.82  0.50 - 1.10 mg/dL   Calcium 9.6  8.4 - 95.6 mg/dL   GFR calc non Af Amer >90  >90 mL/min   GFR calc Af Amer >90  >90  mL/min  URINE MICROSCOPIC-ADD ON      Result Value Range   RBC / HPF TOO NUMEROUS TO COUNT  <3 RBC/hpf   Bacteria, UA MANY (*) RARE  POCT PREGNANCY, URINE      Result Value Range   Preg Test, Ur NEGATIVE  NEGATIVE       Laray Anger, DO 08/16/12 [phone]

## 2012-08-16 NOTE — ED Notes (Signed)
Lower abdominal pain x 3 days. Pt reports bleeding & abdominal pain.

## 2012-08-16 NOTE — ED Notes (Signed)
Pt complaining of bleeding for 17 days & lower abdominal cramping x 3 days.

## 2012-08-18 LAB — URINE CULTURE: Colony Count: 70000

## 2012-08-18 NOTE — Progress Notes (Signed)
ED Antimicrobial Stewardship Positive Culture Follow Up   [name] is an 27 y.o. female who presented to [facility] on 08/16/2012 with a chief complaint of abdominal pain and vaginal bleeding x 2 weeks  Chief Complaint  Patient presents with  . Abdominal Pain    Recent Results (from the past 720 hour(s))  URINE CULTURE     Status: None   Collection Time    08/16/12  5:51 AM      Result Value Range Status   Specimen Description URINE, CLEAN CATCH   Final   Special Requests NONE   Final   Culture  Setup Time 08/16/2012 13:53   Final   Colony Count 70,000 COLONIES/ML   Final   Culture     Final   Value: STAPHYLOCOCCUS SPECIES (COAGULASE NEGATIVE)     Note: RIFAMPIN AND GENTAMICIN SHOULD NOT BE USED AS SINGLE DRUGS FOR TREATMENT OF STAPH INFECTIONS.   Report Status 08/18/2012 FINAL   Final   Organism ID, Bacteria STAPHYLOCOCCUS SPECIES (COAGULASE NEGATIVE)   Final  GC/CHLAMYDIA PROBE AMP     Status: None   Collection Time    08/16/12  6:30 AM      Result Value Range Status   CT Probe RNA NEGATIVE  NEGATIVE Final   GC Probe RNA NEGATIVE  NEGATIVE Final   Comment: (NOTE)                                                                                              **Normal Reference Range: Negative**          Assay performed using the Gen-Probe APTIMA COMBO2 (R) Assay.     Acceptable specimen types for this assay include APTIMA Swabs (Unisex,     endocervical, urethral, or vaginal), first void urine, and ThinPrep     liquid based cytology samples.  WET PREP, GENITAL     Status: Abnormal   Collection Time    08/16/12  6:30 AM      Result Value Range Status   Yeast Wet Prep HPF POC NONE SEEN  NONE SEEN Final   Trich, Wet Prep NONE SEEN  NONE SEEN Final   Clue Cells Wet Prep HPF POC RARE (*) NONE SEEN Final   WBC, Wet Prep HPF POC NONE SEEN  NONE SEEN Final    []  Treated with , organism resistant to prescribed antimicrobial []  Patient discharged originally without  antimicrobial agent and treatment is now indicated  This patient has multiple admissions to the [facility] with complaints of abdominal pain and has several CT scans and pelvic US in the past. The patient was found to be positive for BV and was treated with Flagyl. The CoNS in the UCx is likely a contaminant -- no treatment deemed necessary.  New antibiotic prescription: No additional treatment indicated -- continue Flagyl as prescribed for BV  ED Provider: Marlon Pel, PA-C  [name] 08/18/2012, 10:05 AM Infectious Diseases Pharmacist Phone# [phone]

## 2012-08-19 NOTE — ED Notes (Signed)
+   urine Patient treated with Metronidazole-would not recommending treating per Marlon Pel

## 2012-10-26 ENCOUNTER — Emergency Department (HOSPITAL_COMMUNITY)
Admission: EM | Admit: 2012-10-26 | Discharge: 2012-10-26 | Disposition: A | Discharge status: Home / Self Care | Payer: Medicaid Other | Attending: Emergency Medicine | Admitting: Emergency Medicine

## 2012-10-26 ENCOUNTER — Encounter (HOSPITAL_COMMUNITY): Payer: Self-pay

## 2012-10-26 ENCOUNTER — Emergency Department (HOSPITAL_COMMUNITY): Payer: Medicaid Other

## 2012-10-26 DIAGNOSIS — Z79899 Other long term (current) drug therapy: Secondary | ICD-10-CM | POA: Insufficient documentation

## 2012-10-26 DIAGNOSIS — N200 Calculus of kidney: Secondary | ICD-10-CM | POA: Insufficient documentation

## 2012-10-26 DIAGNOSIS — R112 Nausea with vomiting, unspecified: Secondary | ICD-10-CM | POA: Insufficient documentation

## 2012-10-26 DIAGNOSIS — F17200 Nicotine dependence, unspecified, uncomplicated: Secondary | ICD-10-CM | POA: Insufficient documentation

## 2012-10-26 DIAGNOSIS — Z87440 Personal history of urinary (tract) infections: Secondary | ICD-10-CM | POA: Insufficient documentation

## 2012-10-26 DIAGNOSIS — R1031 Right lower quadrant pain: Secondary | ICD-10-CM | POA: Insufficient documentation

## 2012-10-26 DIAGNOSIS — Z792 Long term (current) use of antibiotics: Secondary | ICD-10-CM | POA: Insufficient documentation

## 2012-10-26 DIAGNOSIS — G8929 Other chronic pain: Secondary | ICD-10-CM | POA: Insufficient documentation

## 2012-10-26 DIAGNOSIS — J45909 Unspecified asthma, uncomplicated: Secondary | ICD-10-CM | POA: Insufficient documentation

## 2012-10-26 DIAGNOSIS — J029 Acute pharyngitis, unspecified: Secondary | ICD-10-CM | POA: Insufficient documentation

## 2012-10-26 LAB — URINALYSIS, ROUTINE W REFLEX MICROSCOPIC
Glucose, UA: NEGATIVE mg/dL
Specific Gravity, Urine: 1.01 (ref 1.005–1.030)
Urobilinogen, UA: 0.2 mg/dL (ref 0.0–1.0)

## 2012-10-26 LAB — URINE MICROSCOPIC-ADD ON

## 2012-10-26 LAB — BASIC METABOLIC PANEL
CO2: 27 mEq/L (ref 19–32)
GFR calc non Af Amer: >90 mL/min (ref >90)
Glucose, Bld: 82 mg/dL (ref 70–99)
Potassium: 3.6 mEq/L (ref 3.5–5.1)
Sodium: 138 mEq/L (ref 135–145)

## 2012-10-26 LAB — CBC WITH DIFFERENTIAL/PLATELET
HCT: 38.6 % (ref 36.0–46.0)
Hemoglobin: 13 g/dL (ref 12.0–15.0)
MCH: 30.4 pg (ref 26.0–34.0)
MCHC: 33.7 g/dL (ref 30.0–36.0)
MCV: 90.2 fL (ref 78.0–100.0)
Monocytes Relative: 6 % (ref 3–12)
RDW: 13.3 % (ref 11.5–15.5)

## 2012-10-26 MED ORDER — HYDROMORPHONE HCL PF 1 MG/ML IJ SOLN
1.0000 mg | Freq: Once | INTRAMUSCULAR | Status: AC
  Administered 2012-10-26: 1 mg via INTRAVENOUS
  Filled 2012-10-26: qty 1

## 2012-10-26 MED ORDER — OXYCODONE-ACETAMINOPHEN 5-325 MG PO TABS: 1.0000 | ORAL_TABLET | Freq: Four times a day (QID) | ORAL | Status: DC | PRN

## 2012-10-26 MED ORDER — CIPROFLOXACIN HCL 500 MG PO TABS: 500.0000 mg | ORAL_TABLET | Freq: Two times a day (BID) | ORAL | Status: DC

## 2012-10-26 MED ORDER — SODIUM CHLORIDE 0.9 % IV BOLUS (SEPSIS)
1000.0000 mL | Freq: Once | INTRAVENOUS | Status: AC
  Administered 2012-10-26: 1000 mL via INTRAVENOUS

## 2012-10-26 MED ORDER — METOCLOPRAMIDE HCL 5 MG/ML IJ SOLN
10.0000 mg | Freq: Once | INTRAMUSCULAR | Status: AC
  Administered 2012-10-26: 10 mg via INTRAVENOUS
  Filled 2012-10-26: qty 2

## 2012-10-26 MED ORDER — HYDROMORPHONE HCL PF 1 MG/ML IJ SOLN
1.0000 mg | Freq: Once | INTRAMUSCULAR | Status: AC
  Administered 2012-10-26: 1 mg via INTRAMUSCULAR

## 2012-10-26 MED ORDER — PROMETHAZINE HCL 25 MG PO TABS: 25.0000 mg | ORAL_TABLET | Freq: Four times a day (QID) | ORAL | Status: DC | PRN

## 2012-10-26 MED ORDER — HYDROMORPHONE HCL PF 1 MG/ML IJ SOLN
1.0000 mg | Freq: Once | INTRAMUSCULAR | Status: DC
  Filled 2012-10-26: qty 1

## 2012-10-26 MED ORDER — PROMETHAZINE HCL 12.5 MG PO TABS
25.0000 mg | ORAL_TABLET | Freq: Once | ORAL | Status: AC
  Administered 2012-10-26: 25 mg via ORAL
  Filled 2012-10-26: qty 2

## 2012-10-26 NOTE — ED Provider Notes (Signed)
CSN: [id number]     Arrival date & time 10/26/12  0945 History  This chart was scribed for [name] Churn, MD by Quintella Reichert, ED scribe.  This patient was seen in room APA08/APA08 and the patient's care was started at 11:10 AM.   Chief Complaint  Patient presents with  . Abdominal Pain   Patient is a 27 y.o. female presenting with abdominal pain. The history is provided by the patient. No language interpreter was used.  Abdominal Pain Pain location:  RLQ Pain quality comment:  Similar to prior kidney stones Pain severity:  Severe Duration: Last night. Timing:  Constant Chronicity:  New Relieved by:  Nothing Worsened by:  Urination Associated symptoms: dysuria, hematuria, nausea, sore throat and vomiting   Sore throat:    Severity:  Moderate   Duration:  3 days   Timing:  Constant Risk factors comment:  H/o kidney stones   HPI Comments: [name] is a 27 y.o. female with h/o kidney stones who presents to the Emergency Department complaining of constant severe RLQ abdominal pain that began last night with associated nausea, vomiting, hematuria and dysuria.  Pain radiates to the right side and groin.  It is exacerbated "when my bladder fills up" and during urination.  Pt states her pain feels similar to prior kidney stones.   Pt also complains of a constant moderate sore throat that began 3 days ago with associated intermittent mild cough.  She denies SOB, difficulty swallowing, or any other associated symptoms.   Past Medical History  Diagnosis Date  . Ovarian cyst   . UTI (lower urinary tract infection)   . Asthma   . Kidney stones   . Calcium oxalate renal stones   . Chronic pelvic pain in female     Past Surgical History  Procedure Laterality Date  . Ovarian cyst removal    . Lithotripsy    . Laparoscopic tubal ligation    . Cholecystectomy    . Appendectomy      Family History  Problem Relation Age of Onset  . Diabetes Other   . Seizures Other    . Cancer Other     History  Substance Use Topics  . Smoking status: Current Every Day Smoker -- 0.50 packs/day for 14 years    Types: Cigarettes  . Smokeless tobacco: Never Used  . Alcohol Use: No    OB History   Grav Para Term Preterm Abortions TAB SAB Ect Mult Living   13 2 1 1 10  9 1  2       Review of Systems  HENT: Positive for sore throat. Negative for trouble swallowing.   Gastrointestinal: Positive for nausea, vomiting and abdominal pain.  Genitourinary: Positive for dysuria and hematuria.  All other systems reviewed and are negative.     Allergies  Iohexol; Zofran; Ketorolac tromethamine; and Morphine and related  Home Medications   Current Outpatient Rx  Name  Route  Sig  Dispense  Refill  . cephALEXin (KEFLEX) 500 MG capsule   Oral   Take 500 mg by mouth 2 (two) times daily.         Marland Kitchen ibuprofen (ADVIL,MOTRIN) 800 MG tablet   Oral   Take 800 mg by mouth daily as needed for pain.          BP 171/102  Pulse 90  Temp(Src) 98.3 F (36.8 C) (Oral)  Resp 22  Ht 5\' 4"  (1.626 m)  Wt 204 lb (92.534 kg)  BMI 35 kg/m2  SpO2 100%  LMP 10/07/2012  Physical Exam  Nursing note and vitals reviewed. Constitutional: She is oriented to person, place, and time. She appears well-developed and well-nourished.  Appears uncomfortable  HENT:  Head: Normocephalic and atraumatic.  Mouth/Throat: Oropharynx is clear and moist and mucous membranes are normal. No oropharyngeal exudate, posterior oropharyngeal erythema or tonsillar abscesses.  Eyes: Conjunctivae are normal. Pupils are equal, round, and reactive to light. No scleral icterus.  Neck: Neck supple.  Cardiovascular: Normal rate, regular rhythm, normal heart sounds and intact distal pulses.   No murmur heard. Pulmonary/Chest: Effort normal and breath sounds normal. No stridor. No respiratory distress. She has no wheezes. She has no rales.  Abdominal: Soft. Normal appearance and bowel sounds are normal. She  exhibits no distension. There is no tenderness. There is CVA tenderness (right). There is no rigidity, no rebound and no guarding.  Neurological: She is alert and oriented to person, place, and time.  Skin: Skin is warm and dry. No rash noted.  Psychiatric: She has a normal mood and affect. Her behavior is normal.    ED Course  Procedures (including critical care time)  DIAGNOSTIC STUDIES: Oxygen Saturation is 100% on room air, normal by my interpretation.    COORDINATION OF CARE: 11:19 AM: Discussed treatment plan which includes pain medication, anti-emetics, imaging and labs.  Pt expressed understanding and agreed to plan.   Labs Review Labs Reviewed  BASIC METABOLIC PANEL - Abnormal; Notable for the following:    BUN 5 (*)    All other components within normal limits  URINALYSIS, ROUTINE W REFLEX MICROSCOPIC - Abnormal; Notable for the following:    Hgb urine dipstick LARGE (*)    Leukocytes, UA MODERATE (*)    All other components within normal limits  URINE MICROSCOPIC-ADD ON - Abnormal; Notable for the following:    Bacteria, UA MANY (*)    All other components within normal limits  URINE CULTURE  CBC WITH DIFFERENTIAL    Imaging Review US Renal  10/26/2012   CLINICAL DATA:  Right flank pain.  EXAM: RENAL/URINARY TRACT ULTRASOUND COMPLETE  COMPARISON:  CT 12/28/2011  FINDINGS: Right Kidney  Length: 10.6 cm. Previously seen punctate renal stones not visualized. Echogenicity within normal limits. No mass or hydronephrosis visualized.  Left Kidney  Length: 11.6 cm. Previously seen punctate renal stones not visualized. Echogenicity within normal limits. No mass or hydronephrosis visualized.  Bladder: Appears normal for degree of bladder distention. Bilateral ureteral jets noted.  IMPRESSION: No acute findings. No hydronephrosis.   Electronically Signed   By: Charlett Nose M.D.   On: 10/26/2012 12:54    MDM   1. Kidney stone    27 yo female with a hx of kidney stones  presenting with right flank pain radiating to right groin.  She states symptoms are similar to prior kidney stones.  She has no abdominal tenderness on repeat abdominal exams, making appendicitis, bowel obstruction, TOA, ovarian torsion very unlikely.  Workup is consistent with nonobstructing renal stone with associated UTI.  She states she is taking keflex which has not helped.  Pain and nausea controlled after a few doses of IV Dilaudid and reglan. Therefore, will dc with Cipro and pain control, close PCP follow up.      I personally performed the services described in this documentation, which was scribed in my presence. The recorded information has been reviewed and is accurate.     [name] Churn, MD 10/26/12 [phone]

## 2012-10-26 NOTE — ED Notes (Signed)
x3 unsuccessful (x1 attempt by Tonita Phoenix RN, x2 attempts made by Hollie Salk RN) IV attempts made. Lab having difficult time drawing blood. EDP made aware.

## 2012-10-26 NOTE — ED Notes (Signed)
1. Pt reports sore throat since Sunday 2. Back pain since last night, denies any known injury, +painful urination since this am, denies any vaginal discharge.

## 2012-10-28 ENCOUNTER — Telehealth: Payer: Self-pay | Admitting: Infectious Disease

## 2012-10-28 ENCOUNTER — Emergency Department (HOSPITAL_COMMUNITY): Payer: Medicaid Other

## 2012-10-28 ENCOUNTER — Encounter (HOSPITAL_COMMUNITY): Payer: Self-pay | Admitting: *Deleted

## 2012-10-28 ENCOUNTER — Telehealth (HOSPITAL_COMMUNITY): Payer: Self-pay | Admitting: Emergency Medicine

## 2012-10-28 ENCOUNTER — Other Ambulatory Visit: Payer: Self-pay

## 2012-10-28 ENCOUNTER — Inpatient Hospital Stay (HOSPITAL_COMMUNITY)
Admission: EM | Admit: 2012-10-28 | Discharge: 2012-10-31 | DRG: 690 | Disposition: A | Discharge status: Home / Self Care | Payer: Medicaid Other | Attending: Internal Medicine | Admitting: Internal Medicine

## 2012-10-28 DIAGNOSIS — Z9089 Acquired absence of other organs: Secondary | ICD-10-CM

## 2012-10-28 DIAGNOSIS — Z113 Encounter for screening for infections with a predominantly sexual mode of transmission: Secondary | ICD-10-CM

## 2012-10-28 DIAGNOSIS — N10 Acute tubulo-interstitial nephritis: Secondary | ICD-10-CM

## 2012-10-28 DIAGNOSIS — N390 Urinary tract infection, site not specified: Principal | ICD-10-CM

## 2012-10-28 DIAGNOSIS — Z91041 Radiographic dye allergy status: Secondary | ICD-10-CM

## 2012-10-28 DIAGNOSIS — R109 Unspecified abdominal pain: Secondary | ICD-10-CM

## 2012-10-28 DIAGNOSIS — Z833 Family history of diabetes mellitus: Secondary | ICD-10-CM

## 2012-10-28 DIAGNOSIS — R110 Nausea: Secondary | ICD-10-CM

## 2012-10-28 DIAGNOSIS — Z9851 Tubal ligation status: Secondary | ICD-10-CM

## 2012-10-28 DIAGNOSIS — N200 Calculus of kidney: Secondary | ICD-10-CM | POA: Diagnosis present

## 2012-10-28 DIAGNOSIS — R7881 Bacteremia: Secondary | ICD-10-CM

## 2012-10-28 DIAGNOSIS — M545 Low back pain: Secondary | ICD-10-CM

## 2012-10-28 DIAGNOSIS — J45909 Unspecified asthma, uncomplicated: Secondary | ICD-10-CM | POA: Diagnosis present

## 2012-10-28 DIAGNOSIS — F17200 Nicotine dependence, unspecified, uncomplicated: Secondary | ICD-10-CM | POA: Diagnosis present

## 2012-10-28 DIAGNOSIS — A4902 Methicillin resistant Staphylococcus aureus infection, unspecified site: Secondary | ICD-10-CM | POA: Diagnosis present

## 2012-10-28 LAB — CBC WITH DIFFERENTIAL/PLATELET
Basophils Relative: 1 % (ref 0–1)
Eosinophils Absolute: 0 10*3/uL (ref 0.0–0.7)
Hemoglobin: 12.9 g/dL (ref 12.0–15.0)
MCH: 30.1 pg (ref 26.0–34.0)
MCHC: 33.2 g/dL (ref 30.0–36.0)
Monocytes Relative: 5 % (ref 3–12)
Neutrophils Relative %: 44 % (ref 43–77)

## 2012-10-28 LAB — COMPREHENSIVE METABOLIC PANEL
ALT: 30 U/L (ref 0–35)
AST: 23 U/L (ref 0–37)
Albumin: 4.4 g/dL (ref 3.5–5.2)
Alkaline Phosphatase: 84 U/L (ref 39–117)
BUN: 4 mg/dL — ABNORMAL LOW (ref 6–23)
Chloride: 102 mEq/L (ref 96–112)
Potassium: 3.5 mEq/L (ref 3.5–5.1)
Sodium: 138 mEq/L (ref 135–145)
Total Bilirubin: 0.3 mg/dL (ref 0.3–1.2)

## 2012-10-28 LAB — URINE MICROSCOPIC-ADD ON

## 2012-10-28 LAB — URINALYSIS, ROUTINE W REFLEX MICROSCOPIC
Bilirubin Urine: NEGATIVE
Glucose, UA: NEGATIVE mg/dL
Ketones, ur: NEGATIVE mg/dL
Specific Gravity, Urine: <1.005 — ABNORMAL LOW (ref 1.005–1.030)
pH: 7 (ref 5.0–8.0)

## 2012-10-28 LAB — LIPASE, BLOOD: Lipase: 42 U/L (ref 11–59)

## 2012-10-28 LAB — PREGNANCY, URINE: Preg Test, Ur: NEGATIVE

## 2012-10-28 LAB — URINE CULTURE: Colony Count: >=100000

## 2012-10-28 MED ORDER — HYDROMORPHONE HCL PF 1 MG/ML IJ SOLN: 1.0000 mg | Freq: Once | INTRAMUSCULAR | Status: DC

## 2012-10-28 MED ORDER — ONDANSETRON HCL 4 MG PO TABS: 4.0000 mg | ORAL_TABLET | Freq: Four times a day (QID) | ORAL | Status: DC | PRN

## 2012-10-28 MED ORDER — HYDROMORPHONE HCL PF 2 MG/ML IJ SOLN
2.0000 mg | Freq: Once | INTRAMUSCULAR | Status: AC
  Administered 2012-10-28: 2 mg via INTRAMUSCULAR
  Filled 2012-10-28: qty 1

## 2012-10-28 MED ORDER — SODIUM CHLORIDE 0.9 % IV SOLN
INTRAVENOUS | Status: DC
  Administered 2012-10-29: 07:00:00 via INTRAVENOUS

## 2012-10-28 MED ORDER — DEXTROSE-NACL 5-0.9 % IV SOLN
INTRAVENOUS | Status: DC
  Administered 2012-10-28 – 2012-10-31 (×4): via INTRAVENOUS

## 2012-10-28 MED ORDER — ONDANSETRON 4 MG PO TBDP
4.0000 mg | ORAL_TABLET | Freq: Once | ORAL | Status: DC
  Filled 2012-10-28: qty 1

## 2012-10-28 MED ORDER — HYDROMORPHONE HCL PF 1 MG/ML IJ SOLN
1.0000 mg | INTRAMUSCULAR | Status: DC | PRN
  Administered 2012-10-28: 1 mg via INTRAVENOUS
  Filled 2012-10-28: qty 1

## 2012-10-28 MED ORDER — VANCOMYCIN HCL IN DEXTROSE 1-5 GM/200ML-% IV SOLN
1000.0000 mg | Freq: Once | INTRAVENOUS | Status: DC
  Filled 2012-10-28: qty 200

## 2012-10-28 MED ORDER — ONDANSETRON HCL 4 MG/2ML IJ SOLN
4.0000 mg | Freq: Four times a day (QID) | INTRAMUSCULAR | Status: DC | PRN
  Filled 2012-10-28: qty 2

## 2012-10-28 MED ORDER — HYDROMORPHONE HCL PF 1 MG/ML IJ SOLN
1.0000 mg | Freq: Once | INTRAMUSCULAR | Status: AC
  Administered 2012-10-28: 1 mg via INTRAVENOUS
  Filled 2012-10-28: qty 1

## 2012-10-28 MED ORDER — PROMETHAZINE HCL 12.5 MG PO TABS
12.5000 mg | ORAL_TABLET | Freq: Once | ORAL | Status: AC
  Administered 2012-10-28: 12.5 mg via ORAL
  Filled 2012-10-28: qty 1

## 2012-10-28 MED ORDER — ENOXAPARIN SODIUM 40 MG/0.4ML ~~LOC~~ SOLN
40.0000 mg | SUBCUTANEOUS | Status: DC
  Administered 2012-10-29 – 2012-10-30 (×3): 40 mg via SUBCUTANEOUS
  Filled 2012-10-28 (×5): qty 0.4

## 2012-10-28 MED ORDER — HYDROMORPHONE HCL PF 1 MG/ML IJ SOLN
1.0000 mg | INTRAMUSCULAR | Status: DC | PRN
  Administered 2012-10-28 – 2012-10-29 (×4): 1 mg via INTRAVENOUS
  Filled 2012-10-28 (×2): qty 1
  Filled 2012-10-28: qty 2

## 2012-10-28 MED ORDER — SODIUM CHLORIDE 0.9 % IV BOLUS (SEPSIS)
500.0000 mL | Freq: Once | INTRAVENOUS | Status: AC
  Administered 2012-10-28: 500 mL via INTRAVENOUS

## 2012-10-28 MED ORDER — DOCUSATE SODIUM 100 MG PO CAPS
100.0000 mg | ORAL_CAPSULE | Freq: Two times a day (BID) | ORAL | Status: DC
  Administered 2012-10-29 – 2012-10-30 (×4): 100 mg via ORAL
  Filled 2012-10-28 (×5): qty 1

## 2012-10-28 MED ORDER — PROMETHAZINE HCL 25 MG/ML IJ SOLN
12.5000 mg | Freq: Four times a day (QID) | INTRAMUSCULAR | Status: DC | PRN
  Administered 2012-10-28: 12.5 mg via INTRAVENOUS
  Filled 2012-10-28: qty 1

## 2012-10-28 MED ORDER — SODIUM CHLORIDE 0.9 % IV SOLN: INTRAVENOUS | Status: DC

## 2012-10-28 NOTE — Progress Notes (Signed)
ED Antimicrobial Stewardship Positive Culture Follow Up   [name] is an 28 y.o. female who presented to [facility] on 10/26/2012 with a chief complaint of sore throat, back pain, and pain with urination  Chief Complaint  Patient presents with  . Sore Throat  . Back Pain    Recent Results (from the past 720 hour(s))  URINE CULTURE     Status: None   Collection Time    10/26/12 12:00 PM      Result Value Range Status   Specimen Description URINE, CLEAN CATCH   Final   Special Requests NONE   Final   Culture  Setup Time     Final   Value: 10/26/2012 19:48     Performed at [facility] Count     Final   Value: >=100,000 COLONIES/ML     Performed at [facility]   Culture     Final   Value: STAPHYLOCOCCUS AUREUS     Note: RIFAMPIN AND GENTAMICIN SHOULD NOT BE USED AS SINGLE DRUGS FOR TREATMENT OF STAPH INFECTIONS.     Performed at [facility]   Report Status PENDING   Incomplete    []  Needs to come back to [facility] for BCx and work-up  26 YOF who presented with sore throat, back pain, and pain with urination. Back pain and pain with urination thought to be due to kidney stones -- renal US showed non-obstructive kidney stones. Work-up revealed a UTI and the patient was sent home on Keflex.   The urine culture has now resulted as Staph Aureus -- which is not a known urine colonizer or pathogen. It is possible that it is seeding the urine from an alternative site. The patient was discussed with the Dr Daiva Eves who agreed with bringing the patient back in for blood cultures and full evaluation  New antibiotic prescription: D/c Keflex. Call patient and have them come back to the ED for blood cultures and full evaluation of Staph Aureus in the urine cultures.  ED Provider: Roxy Horseman, PA-C  [name] 10/28/2012, 10:22 AM Infectious Diseases Pharmacist Phone# [phone]

## 2012-10-28 NOTE — ED Notes (Signed)
MD at bedside. 

## 2012-10-28 NOTE — Progress Notes (Signed)
At 1845 pt  Into 6n06 via [facility], vs taken iv placed on pump and MD [facility] informed of arrival via text.

## 2012-10-28 NOTE — H&P (Signed)
[facility] History and Physical  [name] ZOX:[id number] DOB: 11/28/1985    PCP:   Rebecka Apley, RN   Chief Complaint: flank pain bilaterally with left greater than right.  HPI: [name] is an 27 y.o. 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  Unless the MRSA was a contaminant, there would be concern for bacteria embolization.  She has no fever, chills, but has nausea and vomiting.  Her repeated UA today only showed 3 WBC's.  Her abdominal pelvic CT showed no ureteral stone nor hydronephrosis, but she does has a non-obstructing renal stone.  She has a normal WBC, HB, and normal renal fx tests.  Hospitalist was asked to accept her in transfer for further work up.  Rewiew of Systems:  Constitutional: Negative for malaise, fever and chills. No significant weight loss or weight gain Eyes: Negative for eye pain, redness and discharge, diplopia, visual changes, or flashes of light. ENMT: Negative for ear pain, hoarseness, nasal congestion, sinus pressure and sore throat. No headaches; tinnitus, drooling, or problem swallowing. Cardiovascular: Negative for chest pain, palpitations, diaphoresis, dyspnea and peripheral edema. ; No orthopnea, PND Respiratory: Negative for cough, hemoptysis, wheezing and stridor. No pleuritic chestpain. Gastrointestinal: Negative for nausea, vomiting, diarrhea, constipation, abdominal pain, melena, blood in stool, hematemesis, jaundice and rectal bleeding.    Genitourinary: Negative for frequency, dysuria, incontinence and hematuria; Musculoskeletal: Negative for back pain and neck pain. Negative for swelling and trauma.;  Skin: . Negative for pruritus, rash, abrasions,  bruising and skin lesion.; ulcerations Neuro: Negative for headache, lightheadedness and neck stiffness. Negative for weakness, altered level of consciousness , altered mental status, extremity weakness, burning feet, involuntary movement, seizure and syncope.  Psych: negative for anxiety, depression, insomnia, tearfulness, panic attacks, hallucinations, paranoia, suicidal or homicidal ideation    Past Medical History  Diagnosis Date  . Ovarian cyst   . UTI (lower urinary tract infection)   . Asthma   . Kidney stones   . Calcium oxalate renal stones   . Chronic pelvic pain in female     Past Surgical History  Procedure Laterality Date  . Ovarian cyst removal    . Lithotripsy    . Laparoscopic tubal ligation    . Cholecystectomy    . Appendectomy      Medications:  HOME MEDS: Prior to Admission medications   Medication Sig Start Date End Date Taking? Authorizing Provider  ciprofloxacin (CIPRO) 500 MG tablet Take 1 tablet (500 mg total) by mouth 2 (two) times daily. 10/26/12  Yes Candyce Churn, MD  ibuprofen (ADVIL,MOTRIN) 800 MG tablet Take 800 mg by mouth daily as needed for pain. 11/14/11  Yes Kathie Dike, PA-C  oxyCODONE-acetaminophen (PERCOCET/ROXICET) 5-325 MG per tablet Take 1-2 tablets by mouth every 6 (six) hours as needed for pain. 10/26/12  Yes Candyce Churn, MD  cephALEXin (KEFLEX) 500 MG capsule Take 500 mg by mouth 2 (two) times daily.    Historical Provider, MD  promethazine (PHENERGAN) 25 MG tablet Take 1 tablet (25 mg total) by mouth every 6 (six) hours as needed for nausea. 10/26/12   Candyce Churn, MD     Allergies:  Allergies  Allergen Reactions  . Iohexol Hives  ALLERGIC TO IVP DYE, HIVE, PT NEEDS PRE MEDS   . Zofran Nausea And Vomiting  . Ketorolac Tromethamine Rash  . Morphine And Related Rash    Social History:   reports that she has been smoking Cigarettes.  She has a 7 pack-year smoking history. She has never used smokeless  tobacco. She reports that she does not drink alcohol or use illicit drugs.  Family History: Family History  Problem Relation Age of Onset  . Diabetes Other   . Seizures Other   . Cancer Other      Physical Exam: Filed Vitals:   10/28/12 1037 10/28/12 1224 10/28/12 1644 10/28/12 1855  BP: 146/99 159/96 159/100 166/108  Pulse: 91 73  76  Temp: 98.6 F (37 C)  98.4 F (36.9 C) 97.8 F (36.6 C)  TempSrc: Oral  Oral Oral  Resp: 20 20 16 20   Height: 5\' 4"  (1.626 m)   5\' 4"  (1.626 m)  Weight: 93.441 kg (206 lb)   93.8 kg (206 lb 12.7 oz)  SpO2: 100% 100% 100% 100%   Blood pressure 166/108, pulse 76, temperature 97.8 F (36.6 C), temperature source Oral, resp. rate 20, height 5\' 4"  (1.626 m), weight 93.8 kg (206 lb 12.7 oz), last menstrual period 10/07/2012, SpO2 100.00%.  GEN:  Pleasant  patient lying in the stretcher in no acute distress; cooperative with exam. PSYCH:  alert and oriented x4; does not appear anxious or depressed; affect is appropriate. HEENT: Mucous membranes pink and anicteric; PERRLA; EOM intact; no cervical lymphadenopathy nor thyromegaly or carotid bruit; no JVD; There were no stridor. Neck is very supple. Breasts:: Not examined CHEST WALL: No tenderness CHEST: Normal respiration, clear to auscultation bilaterally.  HEART: Regular rate and rhythm.  There are no murmur, rub, or gallops.   BACK: No kyphosis or scoliosis; slightly tender to touch over the left flank. ABDOMEN: soft and non-tender; no masses, no organomegaly, normal abdominal bowel sounds; no pannus; no intertriginous candida. There is no rebound and no distention. Rectal Exam: Not done EXTREMITIES: No bone or joint deformity; age-appropriate arthropathy of the hands and knees; no edema; no ulcerations.  There is no calf tenderness. Genitalia: not examined PULSES: 2+ and symmetric SKIN: Normal hydration no rash or ulceration.  No peripheral stigmata of endocarditis. CNS: Cranial nerves 2-12 grossly  intact no focal lateralizing neurologic deficit.  Speech is fluent; uvula elevated with phonation, facial symmetry and tongue midline. DTR are normal bilaterally, cerebella exam is intact, barbinski is negative and strengths are equaled bilaterally.  No sensory loss.   Labs on Admission:  Basic Metabolic Panel:  Recent Labs Lab 10/26/12 1151 10/28/12 1216  NA 138 138  K 3.6 3.5  CL 101 102  CO2 27 24  GLUCOSE 82 87  BUN 5* 4*  CREATININE 0.66 0.69  CALCIUM 10.1 10.4   Liver Function Tests:  Recent Labs Lab 10/28/12 1216  AST 23  ALT 30  ALKPHOS 84  BILITOT 0.3  PROT 8.3  ALBUMIN 4.4    Recent Labs Lab 10/28/12 1216  LIPASE 42   No results found for this basename: AMMONIA,  in the last 168 hours CBC:  Recent Labs Lab 10/26/12 1151 10/28/12 1350  WBC 5.1 5.1  NEUTROABS 2.8 2.2  HGB 13.0 12.9  HCT 38.6 38.8  MCV 90.2 90.7  PLT 209 298   Cardiac Enzymes: No results found for this basename: CKTOTAL, CKMB, CKMBINDEX, TROPONINI,  in the last 168 hours  CBG: No results found for  this basename: GLUCAP,  in the last 168 hours   Radiological Exams on Admission: Ct Abdomen Pelvis Wo Contrast  10/28/2012   *RADIOLOGY REPORT*  Clinical Data: Flank pain.  History of renal stones.  CT ABDOMEN AND PELVIS WITHOUT CONTRAST  Technique:  Multidetector CT imaging of the abdomen and pelvis was performed following the standard protocol without intravenous contrast.  Comparison: CT 05/20/2012  Findings: Visualization of the lung bases demonstrates no consolidative or nodular pulmonary opacities.  No pleural effusion. Normal heart size.  Lack of intravenous contrast material limits evaluation of the solid organ parenchyma.  Status post cholecystectomy.  The spleen, pancreas and bilateral adrenal glands are unremarkable.  No significant interval change in size or number of multiple nonobstructing left-sided renal stones with the largest in the inferior pole measuring 6 mm.  No  ureterolithiasis.  No hydronephrosis.  Urinary bladder is grossly unremarkable.  Normal caliber abdominal aorta.  No retroperitoneal, pelvic or mesenteric lymphadenopathy.  Uterus is grossly unremarkable.  No abnormal bowel wall thickening.  No bowel obstruction.  No free fluid or free intraperitoneal air.  Postsurgical change compatible with appendectomy.  No aggressive or acute appearing osseous lesions.  IMPRESSION: No hydronephrosis or evidence for ureterolithiasis.  Unchanged nonobstructing left renal stones.   Original Report Authenticated By: Annia Belt, M.D   Dg Chest 2 View  10/28/2012   CLINICAL DATA:  Possible endocarditis.  Asthma.  EXAM: CHEST  2 VIEW  COMPARISON:  Single view of the chest 07/21/2011.  FINDINGS: The heart size and mediastinal contours are within normal limits. Both lungs are clear. The visualized skeletal structures are unremarkable.  IMPRESSION: No active cardiopulmonary disease.   Electronically Signed   By: Drusilla Kanner M.D.   On: 10/28/2012 14:43   Assessment/Plan Present on Admission:  . Nausea . Nephrolithiasis UTI Flank pain;  PLAN:  Unless the urine culture was contaminated, which is quite possible, MRSA in the urine is unusual and conerning for endocarditis.  She will need a TEE, along with BC.  I discussed with Dr Terri Piedra, who will see her tomorrow, about the case, and will hold off on further antibiotics.  Will give IVF, and pain medication.  I have made her NPO in case TEE can be done tomorrow.  If not, please give her a regular diet.  It is also possible that she has another kidney stone which was not detected by the CT today.  She is otherwise stable, full code, and will be admitted to [facility] service.  Thank you for allowing me to participate in the care of this nice patient.  Other plans as per orders.  Code Status: FULL Unk Lightning, MD. [facility] Pager [phone] 7pm to 7am.  10/28/2012, 7:57 PM

## 2012-10-28 NOTE — ED Notes (Signed)
Pt requesting more pain medication. Dr. Jodelle Gross made aware and not ordering anything else. Pt made aware no pain medication is being ordered. MD is waiting to speak with Internal Medicine and Infectious Disease regarding pt. Pt becoming increasingly more upset, insisting to see MD "right now". MD made aware.

## 2012-10-28 NOTE — ED Provider Notes (Addendum)
CSN: [id number]     Arrival date & time 10/28/12  1021 History  This chart was scribed for [name] Jakes, MD by Bennett Scrape, ED Scribe. This patient was seen in room APA08/APA08 and the patient's care was started at 11:44 AM.    Chief Complaint  Patient presents with  . abnormal labs     Patient is a 27 y.o. female presenting with flank pain. The history is provided by the patient. No language interpreter was used.  Flank Pain This is a new problem. The current episode started more than 2 days ago. The problem occurs constantly. The problem has been gradually worsening. Associated symptoms include abdominal pain. Pertinent negatives include no chest pain, no headaches and no shortness of breath. Treatments tried: Cipro and Percocet. The treatment provided no relief.     HPI Comments: [name] is a 27 y.o. female who presents to the Emergency Department for persistent right lower back pain that radiates into the RLQ with associated nausea, emesis, dysuria and fatigue for the past 5 days. She reports 2 episodes of emesis today. Pt states that she was evaluated in the ED 2 days ago (10/26/12) for the same. She had a negative [place] and an urine that showed a large amount of RBC and WBCs at the time. She was discharged with Cipro and Percocet with no improvement. She was called by Dr. Almon Hercules at the [facility] Infectious Disease office and was told that she had S. Aureus in urine. He advised her to come to the ED for workup for SAB and IV antibiotics. No blood cultures have been done. She reports having a h/o UTIs but denies any prior kidney infections. She denies diarrhea, CP, cough, congestion and fevers as associated symptoms. LNMP was August 28th, 2014.  PCP is Dr. Ihor Austin in [place] with Dr. [facility] Copa group  Past Medical History  Diagnosis Date  . Ovarian cyst   . UTI (lower urinary tract infection)   . Asthma   . Kidney stones   . Calcium oxalate renal stones   . Chronic pelvic  pain in female    Past Surgical History  Procedure Laterality Date  . Ovarian cyst removal    . Lithotripsy    . Laparoscopic tubal ligation    . Cholecystectomy    . Appendectomy     Family History  Problem Relation Age of Onset  . Diabetes Other   . Seizures Other   . Cancer Other    History  Substance Use Topics  . Smoking status: Current Every Day Smoker -- 0.50 packs/day for 14 years    Types: Cigarettes  . Smokeless tobacco: Never Used  . Alcohol Use: No   OB History   Grav Para Term Preterm Abortions TAB SAB Ect Mult Living   13 2 1 1 10  9 1  2      Review of Systems  Constitutional: Positive for fatigue. Negative for fever and chills.  HENT: Positive for sore throat and neck pain (chronic). Negative for congestion.   Eyes: Negative for visual disturbance.  Respiratory: Positive for cough. Negative for shortness of breath.   Cardiovascular: Negative for chest pain and leg swelling.  Gastrointestinal: Positive for nausea, vomiting and abdominal pain. Negative for diarrhea.  Genitourinary: Positive for dysuria and flank pain.  Musculoskeletal: Positive for back pain.  Skin: Negative for rash.  Neurological: Negative for headaches.  Hematological: Does not bruise/bleed easily.  Psychiatric/Behavioral: Negative for confusion.    Allergies  Iohexol; Zofran; Ketorolac tromethamine; and Morphine and related  Home Medications   Current Outpatient Rx  Name  Route  Sig  Dispense  Refill  . ciprofloxacin (CIPRO) 500 MG tablet   Oral   Take 1 tablet (500 mg total) by mouth 2 (two) times daily.   14 tablet   0   . ibuprofen (ADVIL,MOTRIN) 800 MG tablet   Oral   Take 800 mg by mouth daily as needed for pain.         Marland Kitchen oxyCODONE-acetaminophen (PERCOCET/ROXICET) 5-325 MG per tablet   Oral   Take 1-2 tablets by mouth every 6 (six) hours as needed for pain.   14 tablet   0   . cephALEXin (KEFLEX) 500 MG capsule   Oral   Take 500 mg by mouth 2 (two) times  daily.         . promethazine (PHENERGAN) 25 MG tablet   Oral   Take 1 tablet (25 mg total) by mouth every 6 (six) hours as needed for nausea.   10 tablet   0    Triage Vitals: BP 146/99  Pulse 91  Temp(Src) 98.6 F (37 C) (Oral)  Resp 20  Ht 5\' 4"  (1.626 m)  Wt 206 lb (93.441 kg)  BMI 35.34 kg/m2  SpO2 100%  LMP 10/07/2012  Physical Exam  Nursing note and vitals reviewed. Constitutional: She is oriented to person, place, and time. She appears well-developed and well-nourished.  Tearful  HENT:  Head: Normocephalic and atraumatic.  Mouth/Throat: Oropharynx is clear and moist.  Moist MM  Eyes: Conjunctivae and EOM are normal. Pupils are equal, round, and reactive to light.  Sclera are clear  Neck: Neck supple. No tracheal deviation present.  Cardiovascular: Normal rate and regular rhythm.   No murmur heard. Pulses:      Dorsalis pedis pulses are 2+ on the right side, and 2+ on the left side.  Pulmonary/Chest: Effort normal and breath sounds normal. No respiratory distress. She has no wheezes.  Abdominal: Soft. Bowel sounds are normal. She exhibits no distension. There is no tenderness.  Musculoskeletal: Normal range of motion. She exhibits no edema (no ankle swelling).  Capillary refill is <1 second in all toes  Lymphadenopathy:    She has no cervical adenopathy.  Neurological: She is alert and oriented to person, place, and time. No cranial nerve deficit.  Pt able to move both sets of fingers and toes  Skin: Skin is warm and dry. No rash noted.  Psychiatric: She has a normal mood and affect. Her behavior is normal.    ED Course  Procedures (including critical care time)  Medications  0.9 %  sodium chloride infusion (not administered)  ondansetron (ZOFRAN-ODT) disintegrating tablet 4 mg (4 mg Oral Not Given 10/28/12 1243)  vancomycin (VANCOCIN) IVPB 1000 mg/200 mL premix (0 mg Intravenous Hold 10/28/12 1414)  sodium chloride 0.9 % bolus 500 mL (500 mLs Intravenous  New Bag/Given 10/28/12 1405)  HYDROmorphone (DILAUDID) injection 2 mg (2 mg Intramuscular Given 10/28/12 1227)  HYDROmorphone (DILAUDID) injection 1 mg (1 mg Intravenous Given 10/28/12 1409)  promethazine (PHENERGAN) tablet 12.5 mg (12.5 mg Oral Given 10/28/12 1409)    DIAGNOSTIC STUDIES: Oxygen Saturation is 100% on room air, normal by my interpretation.    COORDINATION OF CARE: 11:53 AM-Discussed treatment plan which includes medications, CT of abdomen, CXR, CBC panel, CMP and UA with pt at bedside and pt agreed to plan.   Labs Review Labs Reviewed  COMPREHENSIVE METABOLIC PANEL -  Abnormal; Notable for the following:    BUN 4 (*)    All other components within normal limits  URINALYSIS, ROUTINE W REFLEX MICROSCOPIC - Abnormal; Notable for the following:    Specific Gravity, Urine <1.005 (*)    Hgb urine dipstick LARGE (*)    Leukocytes, UA SMALL (*)    All other components within normal limits  CBC WITH DIFFERENTIAL - Abnormal; Notable for the following:    Lymphocytes Relative 51 (*)    All other components within normal limits  CULTURE, BLOOD (ROUTINE X 2)  CULTURE, BLOOD (ROUTINE X 2)  LIPASE, BLOOD  PREGNANCY, URINE  URINE MICROSCOPIC-ADD ON  CBC WITH DIFFERENTIAL   Imaging Review Ct Abdomen Pelvis Wo Contrast  10/28/2012   *RADIOLOGY REPORT*  Clinical Data: Flank pain.  History of renal stones.  CT ABDOMEN AND PELVIS WITHOUT CONTRAST  Technique:  Multidetector CT imaging of the abdomen and pelvis was performed following the standard protocol without intravenous contrast.  Comparison: CT 05/20/2012  Findings: Visualization of the lung bases demonstrates no consolidative or nodular pulmonary opacities.  No pleural effusion. Normal heart size.  Lack of intravenous contrast material limits evaluation of the solid organ parenchyma.  Status post cholecystectomy.  The spleen, pancreas and bilateral adrenal glands are unremarkable.  No significant interval change in size or number of  multiple nonobstructing left-sided renal stones with the largest in the inferior pole measuring 6 mm.  No ureterolithiasis.  No hydronephrosis.  Urinary bladder is grossly unremarkable.  Normal caliber abdominal aorta.  No retroperitoneal, pelvic or mesenteric lymphadenopathy.  Uterus is grossly unremarkable.  No abnormal bowel wall thickening.  No bowel obstruction.  No free fluid or free intraperitoneal air.  Postsurgical change compatible with appendectomy.  No aggressive or acute appearing osseous lesions.  IMPRESSION: No hydronephrosis or evidence for ureterolithiasis.  Unchanged nonobstructing left renal stones.   Original Report Authenticated By: Annia Belt, M.D   Dg Chest 2 View  10/28/2012   CLINICAL DATA:  Possible endocarditis.  Asthma.  EXAM: CHEST  2 VIEW  COMPARISON:  Single view of the chest 07/21/2011.  FINDINGS: The heart size and mediastinal contours are within normal limits. Both lungs are clear. The visualized skeletal structures are unremarkable.  IMPRESSION: No active cardiopulmonary disease.   Electronically Signed   By: Drusilla Kanner M.D.   On: 10/28/2012 14:43   Results for orders placed during the hospital encounter of 10/28/12  COMPREHENSIVE METABOLIC PANEL      Result Value Range   Sodium 138  135 - 145 mEq/L   Potassium 3.5  3.5 - 5.1 mEq/L   Chloride 102  96 - 112 mEq/L   CO2 24  19 - 32 mEq/L   Glucose, Bld 87  70 - 99 mg/dL   BUN 4 (*) 6 - 23 mg/dL   Creatinine, Ser 1.61  0.50 - 1.10 mg/dL   Calcium 09.6  8.4 - 04.5 mg/dL   Total Protein 8.3  6.0 - 8.3 g/dL   Albumin 4.4  3.5 - 5.2 g/dL   AST 23  0 - 37 U/L   ALT 30  0 - 35 U/L   Alkaline Phosphatase 84  39 - 117 U/L   Total Bilirubin 0.3  0.3 - 1.2 mg/dL   GFR calc non Af Amer >90  >90 mL/min   GFR calc Af Amer >90  >90 mL/min  LIPASE, BLOOD      Result Value Range   Lipase 42  11 -  59 U/L  URINALYSIS, ROUTINE W REFLEX MICROSCOPIC      Result Value Range   Color, Urine YELLOW  YELLOW   APPearance  CLEAR  CLEAR   Specific Gravity, Urine <1.005 (*) 1.005 - 1.030   pH 7.0  5.0 - 8.0   Glucose, UA NEGATIVE  NEGATIVE mg/dL   Hgb urine dipstick LARGE (*) NEGATIVE   Bilirubin Urine NEGATIVE  NEGATIVE   Ketones, ur NEGATIVE  NEGATIVE mg/dL   Protein, ur NEGATIVE  NEGATIVE mg/dL   Urobilinogen, UA 0.2  0.0 - 1.0 mg/dL   Nitrite NEGATIVE  NEGATIVE   Leukocytes, UA SMALL (*) NEGATIVE  PREGNANCY, URINE      Result Value Range   Preg Test, Ur NEGATIVE  NEGATIVE  URINE MICROSCOPIC-ADD ON      Result Value Range   Squamous Epithelial / LPF RARE  RARE   WBC, UA 3-6  <3 WBC/hpf   RBC / HPF 3-6  <3 RBC/hpf   Bacteria, UA RARE  RARE  CBC WITH DIFFERENTIAL      Result Value Range   WBC 5.1  4.0 - 10.5 K/uL   RBC 4.28  3.87 - 5.11 MIL/uL   Hemoglobin 12.9  12.0 - 15.0 g/dL   HCT 40.9  81.1 - 91.4 %   MCV 90.7  78.0 - 100.0 fL   MCH 30.1  26.0 - 34.0 pg   MCHC 33.2  30.0 - 36.0 g/dL   RDW 78.2  95.6 - 21.3 %   Platelets 298  150 - 400 K/uL   Neutrophils Relative % 44  43 - 77 %   Neutro Abs 2.2  1.7 - 7.7 K/uL   Lymphocytes Relative 51 (*) 12 - 46 %   Lymphs Abs 2.6  0.7 - 4.0 K/uL   Monocytes Relative 5  3 - 12 %   Monocytes Absolute 0.2  0.1 - 1.0 K/uL   Eosinophils Relative 1  0 - 5 %   Eosinophils Absolute 0.0  0.0 - 0.7 K/uL   Basophils Relative 1  0 - 1 %   Basophils Absolute 0.0  0.0 - 0.1 K/uL    Date: 10/28/2012  Rate: 66  Rhythm: normal sinus rhythm  QRS Axis: normal  Intervals: normal  ST/T Wave abnormalities: nonspecific T wave changes  Conduction Disutrbances:none  Narrative Interpretation:   Old EKG Reviewed: none available   MDM   1. Flank pain    Discussed with internal medicine hospitalist team here. Patient's clinical picture confusing clinically seems like a pilonidal picture urinalysis from the 17th was consistent with too numerous to count reds and 2 numerous to count whites in her urine patient was treated with Cipro urine culture grew staph aureus  methicillin resistant. Appropriately referred by infectious disease for concerns for bacterial endocarditis based on that urine result. However today's urinalysis only has 3-6 whites 3-6 reds patient was treated with Cipro she had not gotten better based on the sensitivity and resistant panel for the urine culture. Patient's white count is normal. Patient's EKG is a rate of 66 there is no fevers patient's renal function is normal CT scan of the abdomen shows no evidence of any kidney stones or higher low. Perplexing what's going on. The concern on their part was concern for bacterial endocarditis. Hospitalist team here does not feel patient needs to be admitted we'll discuss with infectious disease down to [facility] once it raised a concern about the urine culture and discussed the case clinically with  them.  Discussed with the hospitalist here and with infectious disease. Pictures he still feels the patient needs to be admitted to having the staph aureus in the urine concern is for a occult fracture of endocarditis or bacteremia. That may have not been the cause of the urinary tract infection but it was still present. Patient clinically without explanation for her persistent right flank pain and CVA pain. Patient is now starting to complain of left flank pain. Since the stitches he still feels strongly patient needs to be admitted and have a transesophageal echo and hospitalist here does not have the ability to do that and does not want and that the patient here we'll discuss with the hospitalist team at [facility]. [facility] feels that patient should not go home.     I personally performed the services described in this documentation, which was scribed in my presence. The recorded information has been reviewed and is accurate.      [name] Jakes, MD 10/28/12 1531  [name] Jakes, MD 10/28/12 [phone]

## 2012-10-28 NOTE — ED Notes (Signed)
Pt transported to [facility] via [facility] 

## 2012-10-28 NOTE — ED Notes (Signed)
Ultrasound at bedside attempting to start IV.

## 2012-10-28 NOTE — ED Notes (Signed)
Patient began w/n/v/back, dysuria pain Monday.  Came to ER Wednesday.  Had labs drawn and was called by Dr. Almon Hercules today and told to come to ER, that "I have an infection in by bloodstream and need IV antibiotics."

## 2012-10-28 NOTE — ED Notes (Signed)
Patient transported to X-ray 

## 2012-10-28 NOTE — Progress Notes (Signed)
PENDING ACCEPTANCE TRANFER NOTE:  Call received from:    Zackowski at [facility]  REASON FOR REQUESTING TRANSFER:    MRSA UTI, concern for her MRSA bacteremia  HPI:   27 year old with recurrent UTIs, patient grew staph aureus from the urine, that now with the infectious disease: Patient to go to the ED for further evaluation. The concern is to rule out MRSA bacteremia, patient per ID will need transesophageal echocardiogram. Blood cultures obtained in the ED. TEE cannot be done in [facility]   PLAN:  According to telephone report, this patient was accepted for transfer to [facility],   Under [facility] team:  10,  I have requested an order be written to call Flow Manager at [phone] upon patient arrival to the floor for final physician assignment who will do the admission and give admitting orders.  SIGNED: Clint Lipps, MD [facility]  10/28/2012, 4:18 PM

## 2012-10-28 NOTE — Telephone Encounter (Signed)
Pt with S. Aureus in urine, no simultaneiouis blood cultures. I think this is worrisome for true bacteremia with seeding of hte urjine.  I have asked her to come back to the ED for admission for workup for SAB  She needs TEE, repeat blood cultures and investigation for other metastatic foci of infection  She will need 2-6 weeks of IV abx depending on TEE results and studies

## 2012-10-28 NOTE — ED Notes (Signed)
Went into take pt's blood pressure, pt states she does not want her blood pressure to be taken right now. RN is aware.

## 2012-10-28 NOTE — ED Notes (Signed)
Results called from [facility].  URNC -> (+) MRSA.  Pt has already been called and asked to return to ED for further testing by Dr Daiva Eves w/ID.  At time of call pt is currently in [facility] ED.  General flag for MRSA set.  [facility] MD to be notified now MRSA not just staph. Aureus.

## 2012-10-29 DIAGNOSIS — A4902 Methicillin resistant Staphylococcus aureus infection, unspecified site: Secondary | ICD-10-CM

## 2012-10-29 DIAGNOSIS — N390 Urinary tract infection, site not specified: Principal | ICD-10-CM

## 2012-10-29 DIAGNOSIS — M545 Low back pain: Secondary | ICD-10-CM

## 2012-10-29 DIAGNOSIS — Z113 Encounter for screening for infections with a predominantly sexual mode of transmission: Secondary | ICD-10-CM

## 2012-10-29 MED ORDER — BIOTENE DRY MOUTH MT LIQD: 15.0000 mL | Freq: Two times a day (BID) | OROMUCOSAL | Status: DC

## 2012-10-29 MED ORDER — CHLORHEXIDINE GLUCONATE 0.12 % MT SOLN
15.0000 mL | Freq: Two times a day (BID) | OROMUCOSAL | Status: DC
  Administered 2012-10-29: 15 mL via OROMUCOSAL
  Filled 2012-10-29: qty 15

## 2012-10-29 MED ORDER — VANCOMYCIN HCL IN DEXTROSE 1-5 GM/200ML-% IV SOLN
1000.0000 mg | Freq: Three times a day (TID) | INTRAVENOUS | Status: DC
  Administered 2012-10-29 – 2012-10-31 (×6): 1000 mg via INTRAVENOUS
  Filled 2012-10-29 (×9): qty 200

## 2012-10-29 MED ORDER — HYDROMORPHONE HCL PF 1 MG/ML IJ SOLN
2.0000 mg | INTRAMUSCULAR | Status: DC | PRN
  Administered 2012-10-29 – 2012-10-31 (×14): 2 mg via INTRAVENOUS
  Filled 2012-10-29 (×18): qty 2

## 2012-10-29 MED ORDER — PROMETHAZINE HCL 25 MG/ML IJ SOLN
12.5000 mg | INTRAMUSCULAR | Status: DC | PRN
  Administered 2012-10-29 – 2012-10-31 (×14): 12.5 mg via INTRAVENOUS
  Filled 2012-10-29 (×14): qty 1

## 2012-10-29 NOTE — Progress Notes (Signed)
[facility] PROGRESS NOTE   [name] RUE:[id number] DOB: 1985-10-28 DOA: 10/28/2012 PCP: Rebecka Apley, RN  HPI: NIL [name] is an 27 y.o. female with history of recurrent nephrolithiasis (8 times, she said), s/p prior stent placement and removed, asthma, ovarian cyst, was seen in the ER and diagnosed with UTI, treated with cipro, with culture grew MRSA, presents to [facility] emergency as recommended by Dr Terri Piedra for evaluation of possible endocarditis  Assessment/Plan: MRSA UTI  - blood cultures NGTD. Repeat urine culture this morning then probably may need to start antibiotics since patient has urinary tract infection symptoms and flank pain not improved on oral antibiotic at home. Not febrile and stable otherwise.  - denies IVDA or tattoos  - talked to Dr. Eden Emms from cardiology for TEE, will be scheduled early next week, unable to do over the weekend.  - will obtain regular 2D echo meanwhile.  - ID consulted. Nephrolithiasis - CT with stones present without obstruction.  DVT Prophylaxis - Lovenox  Code Status: Full Family Communication: none  Disposition Plan: inpatient  Consultants:  ID  Cardiology for TEE  Procedures:  none  Anti-infectives   Start     Dose/Rate Route Frequency Ordered Stop   10/28/12 1230  vancomycin (VANCOCIN) IVPB 1000 mg/200 mL premix  Status:  Discontinued     1,000 mg 200 mL/hr over 60 Minutes Intravenous  Once 10/28/12 1216 10/28/12 1958     Antibiotics Given (last 72 hours)   None     HPI/Subjective: - ongoing flank pain and dysuria, not improved in the past few days  Objective: Filed Vitals:   10/28/12 1644 10/28/12 1855 10/28/12 2203 10/29/12 0532  BP: 159/100 166/108 128/76 120/76  Pulse:  76 67 66  Temp: 98.4 F (36.9 C) 97.8 F (36.6 C) 98.1 F (36.7 C) 98.4 F (36.9 C)  TempSrc: Oral Oral Oral Oral  Resp: 16 20 18 16   Height:  5\' 4"  (1.626 m)    Weight:  93.8 kg (206 lb 12.7 oz)    SpO2: 100% 100%  100% 100%    Intake/Output Summary (Last 24 hours) at 10/29/12 0804 Last data filed at 10/29/12 1478  Gross per 24 hour  Intake   1162 ml  Output      0 ml  Net   1162 ml   Filed Weights   10/28/12 1037 10/28/12 1855  Weight: 93.441 kg (206 lb) 93.8 kg (206 lb 12.7 oz)    Exam:   General:  NAD  Cardiovascular: regular rate and rhythm, without MRG  Respiratory: good air movement, clear to auscultation throughout, no wheezing, ronchi or rales  Abdomen: soft, not tender to palpation, positive bowel sounds  MSK: no peripheral edema  Data Reviewed: Basic Metabolic Panel:  Recent Labs Lab 10/26/12 1151 10/28/12 1216  NA 138 138  K 3.6 3.5  CL 101 102  CO2 27 24  GLUCOSE 82 87  BUN 5* 4*  CREATININE 0.66 0.69  CALCIUM 10.1 10.4   Liver Function Tests:  Recent Labs Lab 10/28/12 1216  AST 23  ALT 30  ALKPHOS 84  BILITOT 0.3  PROT 8.3  ALBUMIN 4.4    Recent Labs Lab 10/28/12 1216  LIPASE 42   CBC:  Recent Labs Lab 10/26/12 1151 10/28/12 1350  WBC 5.1 5.1  NEUTROABS 2.8 2.2  HGB 13.0 12.9  HCT 38.6 38.8  MCV 90.2 90.7  PLT 209 298   Recent Results (from the past 240 hour(s))  URINE CULTURE     Status: None   Collection Time    10/26/12 12:00 PM      Result Value Range Status   Specimen Description URINE, CLEAN CATCH   Final   Special Requests NONE   Final   Culture  Setup Time     Final   Value: 10/26/2012 19:48     Performed at [facility] Count     Final   Value: >=100,000 COLONIES/ML     Performed at [facility]   Culture     Final   Value: METHICILLIN RESISTANT STAPHYLOCOCCUS AUREUS     Note: RIFAMPIN AND GENTAMICIN SHOULD NOT BE USED AS SINGLE DRUGS FOR TREATMENT OF STAPH INFECTIONS. CRITICAL RESULT CALLED TO, READ BACK BY AND VERIFIED WITH: PATTY CLARK ON 9.19.14 @ 1050 BY FERGK     Performed at [facility]   Report Status 10/28/2012 FINAL   Final   Organism ID, Bacteria METHICILLIN RESISTANT  STAPHYLOCOCCUS AUREUS   Final  CULTURE, BLOOD (ROUTINE X 2)     Status: None   Collection Time    10/28/12  1:50 PM      Result Value Range Status   Specimen Description BLOOD RIGHT ANTECUBITAL   Final   Special Requests BOTTLES DRAWN AEROBIC AND ANAEROBIC 5CC   Final   Culture NO GROWTH 1 DAY   Final   Report Status PENDING   Incomplete  CULTURE, BLOOD (ROUTINE X 2)     Status: None   Collection Time    10/28/12  1:50 PM      Result Value Range Status   Specimen Description BLOOD LEFT HAND   Final   Special Requests BOTTLES DRAWN AEROBIC AND ANAEROBIC 4CC   Final   Culture NO GROWTH 1 DAY   Final   Report Status PENDING   Incomplete     Studies: Ct Abdomen Pelvis Wo Contrast  10/28/2012   *RADIOLOGY REPORT*  Clinical Data: Flank pain.  History of renal stones.  CT ABDOMEN AND PELVIS WITHOUT CONTRAST  Technique:  Multidetector CT imaging of the abdomen and pelvis was performed following the standard protocol without intravenous contrast.  Comparison: CT 05/20/2012  Findings: Visualization of the lung bases demonstrates no consolidative or nodular pulmonary opacities.  No pleural effusion. Normal heart size.  Lack of intravenous contrast material limits evaluation of the solid organ parenchyma.  Status post cholecystectomy.  The spleen, pancreas and bilateral adrenal glands are unremarkable.  No significant interval change in size or number of multiple nonobstructing left-sided renal stones with the largest in the inferior pole measuring 6 mm.  No ureterolithiasis.  No hydronephrosis.  Urinary bladder is grossly unremarkable.  Normal caliber abdominal aorta.  No retroperitoneal, pelvic or mesenteric lymphadenopathy.  Uterus is grossly unremarkable.  No abnormal bowel wall thickening.  No bowel obstruction.  No free fluid or free intraperitoneal air.  Postsurgical change compatible with appendectomy.  No aggressive or acute appearing osseous lesions.  IMPRESSION: No hydronephrosis or evidence for  ureterolithiasis.  Unchanged nonobstructing left renal stones.   Original Report Authenticated By: Annia Belt, M.D   Dg Chest 2 View  10/28/2012   CLINICAL DATA:  Possible endocarditis.  Asthma.  EXAM: CHEST  2 VIEW  COMPARISON:  Single view of the chest 07/21/2011.  FINDINGS: The heart size and mediastinal contours are within normal limits. Both lungs are clear. The visualized skeletal structures are unremarkable.  IMPRESSION: No active cardiopulmonary disease.  Electronically Signed   By: Drusilla Kanner M.D.   On: 10/28/2012 14:43    Scheduled Meds: . sodium chloride   Intravenous STAT  . antiseptic oral rinse  15 mL Mouth Rinse q12n4p  . chlorhexidine  15 mL Mouth Rinse BID  . docusate sodium  100 mg Oral BID  . enoxaparin (LOVENOX) injection  40 mg Subcutaneous Q24H   Continuous Infusions: . sodium chloride 100 mL/hr at 10/29/12 8295  . dextrose 5 % and 0.9% NaCl 100 mL/hr at 10/28/12 2047   Active Problems:   Nephrolithiasis   Nausea   UTI (lower urinary tract infection)   Flank pain  Time spent: 40  Pamella Pert, MD [facility] Pager [phone]. If 7 PM - 7 AM, please contact night-coverage at [email or web address], password [facility] 10/29/2012, 8:04 AM  LOS: 1 day

## 2012-10-29 NOTE — Progress Notes (Signed)
ANTIBIOTIC CONSULT NOTE - INITIAL  Pharmacy Consult for Vancomycin Indication: endocarditis  Allergies  Allergen Reactions  . Iohexol Hives    ALLERGIC TO IVP DYE, HIVE, PT NEEDS PRE MEDS   . Zofran Nausea And Vomiting  . Ketorolac Tromethamine Rash  . Morphine And Related Rash    Patient Measurements: Height: 5\' 4"  (162.6 cm) Weight: 206 lb 12.7 oz (93.8 kg) IBW/kg (Calculated) : 54.7 Adjusted Body Weight: 66.4 kg  Vital Signs: Temp: 98.1 F (36.7 C) (09/20 1337) Temp src: Oral (09/20 0532) BP: 136/98 mmHg (09/20 1337) Pulse Rate: 80 (09/20 1337) Intake/Output from previous day: 09/19 0701 - 09/20 0700 In: 1162 [I.V.:1162] Out: -  Intake/Output from this shift: Total I/O In: 360 [P.O.:360] Out: -   Labs:  Recent Labs  10/28/12 1216 10/28/12 1350  WBC  --  5.1  HGB  --  12.9  PLT  --  298  CREATININE 0.69  --    Estimated Creatinine Clearance: 116.2 ml/min (by C-G formula based on Cr of 0.69). No results found for this basename: Rolm Gala, VANCORANDOM, GENTTROUGH, GENTPEAK, GENTRANDOM, TOBRATROUGH, TOBRAPEAK, TOBRARND, AMIKACINPEAK, AMIKACINTROU, AMIKACIN,  in the last 72 hours   Microbiology: Recent Results (from the past 720 hour(s))  URINE CULTURE     Status: None   Collection Time    10/26/12 12:00 PM      Result Value Range Status   Specimen Description URINE, CLEAN CATCH   Final   Special Requests NONE   Final   Culture  Setup Time     Final   Value: 10/26/2012 19:48     Performed at [facility] Count     Final   Value: >=100,000 COLONIES/ML     Performed at [facility]   Culture     Final   Value: METHICILLIN RESISTANT STAPHYLOCOCCUS AUREUS     Note: RIFAMPIN AND GENTAMICIN SHOULD NOT BE USED AS SINGLE DRUGS FOR TREATMENT OF STAPH INFECTIONS. CRITICAL RESULT CALLED TO, READ BACK BY AND VERIFIED WITH: PATTY CLARK ON 9.19.14 @ 1050 BY FERGK     Performed at [facility]   Report Status 10/28/2012  FINAL   Final   Organism ID, Bacteria METHICILLIN RESISTANT STAPHYLOCOCCUS AUREUS   Final  CULTURE, BLOOD (ROUTINE X 2)     Status: None   Collection Time    10/28/12  1:50 PM      Result Value Range Status   Specimen Description BLOOD RIGHT ANTECUBITAL   Final   Special Requests BOTTLES DRAWN AEROBIC AND ANAEROBIC 5CC   Final   Culture NO GROWTH 1 DAY   Final   Report Status PENDING   Incomplete  CULTURE, BLOOD (ROUTINE X 2)     Status: None   Collection Time    10/28/12  1:50 PM      Result Value Range Status   Specimen Description BLOOD LEFT HAND   Final   Special Requests BOTTLES DRAWN AEROBIC AND ANAEROBIC 4CC   Final   Culture NO GROWTH 1 DAY   Final   Report Status PENDING   Incomplete    Medical History: Past Medical History  Diagnosis Date  . Ovarian cyst   . UTI (lower urinary tract infection)   . Asthma   . Kidney stones   . Calcium oxalate renal stones   . Chronic pelvic pain in female     Medications:  Prescriptions prior to admission  Medication Sig Dispense Refill  .  ciprofloxacin (CIPRO) 500 MG tablet Take 1 tablet (500 mg total) by mouth 2 (two) times daily.  14 tablet  0  . ibuprofen (ADVIL,MOTRIN) 800 MG tablet Take 800 mg by mouth daily as needed for pain.      Marland Kitchen oxyCODONE-acetaminophen (PERCOCET/ROXICET) 5-325 MG per tablet Take 1-2 tablets by mouth every 6 (six) hours as needed for pain.  14 tablet  0  . cephALEXin (KEFLEX) 500 MG capsule Take 500 mg by mouth 2 (two) times daily.      . promethazine (PHENERGAN) 25 MG tablet Take 1 tablet (25 mg total) by mouth every 6 (six) hours as needed for nausea.  10 tablet  0    Admit Complaint: 27 y.o.  female  admitted 10/28/2012 with recurrent nephrolithasis, urine culture growing MRSA, admitted to r/o endocarditis.  Pharmacy consulted to dose Vancomycin.  ZOX:[id number], stent, asthma, ovarian cyst.  Assessment: Anticoagulation: VTE Prophylaxis; enoxaparin  Infectious Disease: MRSA uti, r/o  meningitis Antibiotics: 9/20>>vancomycin Cultures: 9/17> urine  PTA Medication Issues: Home Meds Not Ordered: PTA courses of cipro and keflex  Best Practices: VTE Prophylaxis:  enoxaparin   Goal of Therapy:  Vancomycin trough level 10-15 mcg/ml  Plan:  1. Vancomycin 1g IV q8h 2. Follow up SCr, UOP, cultures, clinical course and adjust as clinically indicated.    Thank you for allowing pharmacy to be a part of this patients care team.  Lovenia Kim Pharm.D., BCPS Clinical Pharmacist 10/29/2012 4:17 PM Pager: [phone] Phone: [phone]

## 2012-10-29 NOTE — Progress Notes (Signed)
IV site tender and swollen, no vein noted, verified by another RN, IV nurse made aware,

## 2012-10-29 NOTE — Consult Note (Signed)
[facility]    Date of Admission:  10/28/2012  Date of Consult:  10/29/2012  Reason for Consult: MRSA in urine concern for undiagnosed MRSA bacteremia  Referring Physician: Dr. Elvera Lennox   HPI: [name] is an 27 y.o. female with hx of renal stones (recurred 8x)  who had prior stent placement, then removed, with hx of recent onset last week of URI ssx of sore throat, malaise for which she took some oral PCN that she had on hand. She then developed right sided back and flank pain and abdominal pain when "my bladder is full" with dysuria, no fevers, but nause and vomiting. UA done in the ED showed copious WBC TNTC, no comment on squamous cells. Urine cx grew >100K MRSA Sensitive to cipro that she has been placed on empirically. I was alerted on Friday that her culture was positive for MRSA via [facility] pharmacy . I shared concern for undiagnosed MRSA bacteremia +/- endocarditi sand asked her to come to ED. She went to [facility] and ultimately transferred to [facility] for consideration of TEE. BLood cultures drwan on cipro, abx held but restarted today due to worsening ssx    Past Medical History  Diagnosis Date  . Ovarian cyst   . UTI (lower urinary tract infection)   . Asthma   . Kidney stones   . Calcium oxalate renal stones   . Chronic pelvic pain in female     Past Surgical History  Procedure Laterality Date  . Ovarian cyst removal    . Lithotripsy    . Laparoscopic tubal ligation    . Cholecystectomy    . Appendectomy    ergies:   Allergies  Allergen Reactions  . Iohexol Hives    ALLERGIC TO IVP DYE, HIVE, PT NEEDS PRE MEDS   . Zofran Nausea And Vomiting  . Ketorolac Tromethamine Rash  . Morphine And Related Rash     Medications: I have reviewed patients current medications as documented in Epic Anti-infectives   Start     Dose/Rate Route Frequency Ordered Stop   10/29/12 1700  vancomycin (VANCOCIN) IVPB 1000 mg/200 mL premix     1,000 mg 200  mL/hr over 60 Minutes Intravenous Every 8 hours 10/29/12 1620     10/28/12 1230  vancomycin (VANCOCIN) IVPB 1000 mg/200 mL premix  Status:  Discontinued     1,000 mg 200 mL/hr over 60 Minutes Intravenous  Once 10/28/12 1216 10/28/12 1958      Social History:  reports that she has been smoking Cigarettes.  She has a 7 pack-year smoking history. She has never used smokeless tobacco. She reports that she does not drink alcohol or use illicit drugs.  Family History  Problem Relation Age of Onset  . Diabetes Other   . Seizures Other   . Cancer Other     As in HPI and primary teams notes otherwise 12 point review of systems is negative  Blood pressure 136/98, pulse 80, temperature 98.1 F (36.7 C), temperature source Oral, resp. rate 20, height 5\' 4"  (1.626 m), weight 206 lb 12.7 oz (93.8 kg), last menstrual period 10/07/2012, SpO2 100.00%. General: Alert and awake, oriented x3, not in any acute distress. HEENT: anicteric sclera, pupils reactive to light and accommodation, EOMI, oropharynx clear and without exudate CVS regular rate, normal r,  no murmur rubs or gallops Chest: clear to auscultation bilaterally, no wheezing, rales or rhonchi Abdomen: soft nontender, nondistended, normal bowel sounds, Extremities: no  clubbing  or edema noted bilaterally Back: TTP on the right Skin: no rashes Neuro: nonfocal, strength and sensation intact   Results for orders placed during the hospital encounter of 10/28/12 (from the past 48 hour(s))  URINALYSIS, ROUTINE W REFLEX MICROSCOPIC     Status: Abnormal   Collection Time    10/28/12 11:15 AM      Result Value Range   Color, Urine YELLOW  YELLOW   APPearance CLEAR  CLEAR   Specific Gravity, Urine <1.005 (*) 1.005 - 1.030   pH 7.0  5.0 - 8.0   Glucose, UA NEGATIVE  NEGATIVE mg/dL   Hgb urine dipstick LARGE (*) NEGATIVE   Bilirubin Urine NEGATIVE  NEGATIVE   Ketones, ur NEGATIVE  NEGATIVE mg/dL   Protein, ur NEGATIVE  NEGATIVE mg/dL    Urobilinogen, UA 0.2  0.0 - 1.0 mg/dL   Nitrite NEGATIVE  NEGATIVE   Leukocytes, UA SMALL (*) NEGATIVE  PREGNANCY, URINE     Status: None   Collection Time    10/28/12 11:15 AM      Result Value Range   Preg Test, Ur NEGATIVE  NEGATIVE   Comment:            THE SENSITIVITY OF THIS     METHODOLOGY IS >20 mIU/mL.  URINE MICROSCOPIC-ADD ON     Status: None   Collection Time    10/28/12 11:15 AM      Result Value Range   Squamous Epithelial / LPF RARE  RARE   WBC, UA 3-6  <3 WBC/hpf   RBC / HPF 3-6  <3 RBC/hpf   Bacteria, UA RARE  RARE  COMPREHENSIVE METABOLIC PANEL     Status: Abnormal   Collection Time    10/28/12 12:16 PM      Result Value Range   Sodium 138  135 - 145 mEq/L   Potassium 3.5  3.5 - 5.1 mEq/L   Chloride 102  96 - 112 mEq/L   CO2 24  19 - 32 mEq/L   Glucose, Bld 87  70 - 99 mg/dL   BUN 4 (*) 6 - 23 mg/dL   Creatinine, Ser 1.61  0.50 - 1.10 mg/dL   Calcium 09.6  8.4 - 04.5 mg/dL   Total Protein 8.3  6.0 - 8.3 g/dL   Albumin 4.4  3.5 - 5.2 g/dL   AST 23  0 - 37 U/L   ALT 30  0 - 35 U/L   Alkaline Phosphatase 84  39 - 117 U/L   Total Bilirubin 0.3  0.3 - 1.2 mg/dL   GFR calc non Af Amer >90  >90 mL/min   GFR calc Af Amer >90  >90 mL/min   Comment: (NOTE)     The eGFR has been calculated using the CKD EPI equation.     This calculation has not been validated in all clinical situations.     eGFR's persistently <90 mL/min signify possible Chronic Kidney     Disease.  LIPASE, BLOOD     Status: None   Collection Time    10/28/12 12:16 PM      Result Value Range   Lipase 42  11 - 59 U/L  CULTURE, BLOOD (ROUTINE X 2)     Status: None   Collection Time    10/28/12  1:50 PM      Result Value Range   Specimen Description BLOOD RIGHT ANTECUBITAL     Special Requests BOTTLES DRAWN AEROBIC AND ANAEROBIC 5CC  Culture NO GROWTH 1 DAY     Report Status PENDING    CULTURE, BLOOD (ROUTINE X 2)     Status: None   Collection Time    10/28/12  1:50 PM      Result  Value Range   Specimen Description BLOOD LEFT HAND     Special Requests BOTTLES DRAWN AEROBIC AND ANAEROBIC 4CC     Culture NO GROWTH 1 DAY     Report Status PENDING    CBC WITH DIFFERENTIAL     Status: Abnormal   Collection Time    10/28/12  1:50 PM      Result Value Range   WBC 5.1  4.0 - 10.5 K/uL   RBC 4.28  3.87 - 5.11 MIL/uL   Hemoglobin 12.9  12.0 - 15.0 g/dL   HCT 40.9  81.1 - 91.4 %   MCV 90.7  78.0 - 100.0 fL   MCH 30.1  26.0 - 34.0 pg   MCHC 33.2  30.0 - 36.0 g/dL   RDW 78.2  95.6 - 21.3 %   Platelets 298  150 - 400 K/uL   Neutrophils Relative % 44  43 - 77 %   Neutro Abs 2.2  1.7 - 7.7 K/uL   Lymphocytes Relative 51 (*) 12 - 46 %   Lymphs Abs 2.6  0.7 - 4.0 K/uL   Monocytes Relative 5  3 - 12 %   Monocytes Absolute 0.2  0.1 - 1.0 K/uL   Eosinophils Relative 1  0 - 5 %   Eosinophils Absolute 0.0  0.0 - 0.7 K/uL   Basophils Relative 1  0 - 1 %   Basophils Absolute 0.0  0.0 - 0.1 K/uL      Component Value Date/Time   SDES BLOOD RIGHT ANTECUBITAL 10/28/2012 1350   SDES BLOOD LEFT HAND 10/28/2012 1350   SPECREQUEST BOTTLES DRAWN AEROBIC AND ANAEROBIC 5CC 10/28/2012 1350   SPECREQUEST BOTTLES DRAWN AEROBIC AND ANAEROBIC 4CC 10/28/2012 1350   CULT NO GROWTH 1 DAY 10/28/2012 1350   CULT NO GROWTH 1 DAY 10/28/2012 1350   REPTSTATUS PENDING 10/28/2012 1350   REPTSTATUS PENDING 10/28/2012 1350   Ct Abdomen Pelvis Wo Contrast  10/28/2012   *RADIOLOGY REPORT*  Clinical Data: Flank pain.  History of renal stones.  CT ABDOMEN AND PELVIS WITHOUT CONTRAST  Technique:  Multidetector CT imaging of the abdomen and pelvis was performed following the standard protocol without intravenous contrast.  Comparison: CT 05/20/2012  Findings: Visualization of the lung bases demonstrates no consolidative or nodular pulmonary opacities.  No pleural effusion. Normal heart size.  Lack of intravenous contrast material limits evaluation of the solid organ parenchyma.  Status post cholecystectomy.  The  spleen, pancreas and bilateral adrenal glands are unremarkable.  No significant interval change in size or number of multiple nonobstructing left-sided renal stones with the largest in the inferior pole measuring 6 mm.  No ureterolithiasis.  No hydronephrosis.  Urinary bladder is grossly unremarkable.  Normal caliber abdominal aorta.  No retroperitoneal, pelvic or mesenteric lymphadenopathy.  Uterus is grossly unremarkable.  No abnormal bowel wall thickening.  No bowel obstruction.  No free fluid or free intraperitoneal air.  Postsurgical change compatible with appendectomy.  No aggressive or acute appearing osseous lesions.  IMPRESSION: No hydronephrosis or evidence for ureterolithiasis.  Unchanged nonobstructing left renal stones.   Original Report Authenticated By: Annia Belt, M.D   Dg Chest 2 View  10/28/2012   CLINICAL DATA:  Possible endocarditis.  Asthma.  EXAM: CHEST  2 VIEW  COMPARISON:  Single view of the chest 07/21/2011.  FINDINGS: The heart size and mediastinal contours are within normal limits. Both lungs are clear. The visualized skeletal structures are unremarkable.  IMPRESSION: No active cardiopulmonary disease.   Electronically Signed   By: Drusilla Kanner M.D.   On: 10/28/2012 14:43     Recent Results (from the past 720 hour(s))  URINE CULTURE     Status: None   Collection Time    10/26/12 12:00 PM      Result Value Range Status   Specimen Description URINE, CLEAN CATCH   Final   Special Requests NONE   Final   Culture  Setup Time     Final   Value: 10/26/2012 19:48     Performed at [facility] Count     Final   Value: >=100,000 COLONIES/ML     Performed at [facility]   Culture     Final   Value: METHICILLIN RESISTANT STAPHYLOCOCCUS AUREUS     Note: RIFAMPIN AND GENTAMICIN SHOULD NOT BE USED AS SINGLE DRUGS FOR TREATMENT OF STAPH INFECTIONS. CRITICAL RESULT CALLED TO, READ BACK BY AND VERIFIED WITH: PATTY CLARK ON 9.19.14 @ 1050 BY FERGK      Performed at [facility]   Report Status 10/28/2012 FINAL   Final   Organism ID, Bacteria METHICILLIN RESISTANT STAPHYLOCOCCUS AUREUS   Final  CULTURE, BLOOD (ROUTINE X 2)     Status: None   Collection Time    10/28/12  1:50 PM      Result Value Range Status   Specimen Description BLOOD RIGHT ANTECUBITAL   Final   Special Requests BOTTLES DRAWN AEROBIC AND ANAEROBIC 5CC   Final   Culture NO GROWTH 1 DAY   Final   Report Status PENDING   Incomplete  CULTURE, BLOOD (ROUTINE X 2)     Status: None   Collection Time    10/28/12  1:50 PM      Result Value Range Status   Specimen Description BLOOD LEFT HAND   Final   Special Requests BOTTLES DRAWN AEROBIC AND ANAEROBIC 4CC   Final   Culture NO GROWTH 1 DAY   Final   Report Status PENDING   Incomplete     Impression/Recommendation  27 yo with recurrent kidney stones now with MRSA urine + concern for undiagnosed MRSA bacteremia +/- endocarditis  #1 MRSA in urine: we typically see this when pt actually is bacterermic and urine is "seeded" from blood NOT typically as a true URINARY pathogen though this CAn occur. BLood cultures were NOT drawn at time of urine cx so no way to prove one way or other with certainty but concern remains  --agree with IV vancomycin --agree with TEE on Monday --if TEE negative consider ? Give 2 week IV course vs FQ + rifampin  #2 Screening: check HIV,   #3 LBP: consider MRI L spine for ruling out diskitis though her hx is more c/w renal colic   Thank you so much for this interesting consult  [facility] [phone] (pager) [phone] (office) 10/29/2012, 4:45 PM  Paulette Blanch Dam 10/29/2012, 4:45 PM

## 2012-10-30 DIAGNOSIS — I514 Myocarditis, unspecified: Secondary | ICD-10-CM

## 2012-10-30 DIAGNOSIS — N10 Acute tubulo-interstitial nephritis: Secondary | ICD-10-CM

## 2012-10-30 LAB — HIV ANTIBODY (ROUTINE TESTING W REFLEX): HIV: NONREACTIVE

## 2012-10-30 MED ORDER — HYDROMORPHONE HCL PF 1 MG/ML IJ SOLN
0.5000 mg | Freq: Once | INTRAMUSCULAR | Status: AC
  Administered 2012-10-30: 0.5 mg via INTRAVENOUS
  Filled 2012-10-30: qty 1

## 2012-10-30 NOTE — Progress Notes (Signed)
Echocardiogram 2D Echocardiogram has been performed.  [name] 10/30/2012, 12:16 PM

## 2012-10-30 NOTE — Progress Notes (Signed)
[facility]    Subjective: C/o back pain, apparently also later requested PICC line   Antibiotics:  Anti-infectives   Start     Dose/Rate Route Frequency Ordered Stop   10/29/12 1700  vancomycin (VANCOCIN) IVPB 1000 mg/200 mL premix     1,000 mg 200 mL/hr over 60 Minutes Intravenous Every 8 hours 10/29/12 1620     10/28/12 1230  vancomycin (VANCOCIN) IVPB 1000 mg/200 mL premix  Status:  Discontinued     1,000 mg 200 mL/hr over 60 Minutes Intravenous  Once 10/28/12 1216 10/28/12 1958      Medications: Scheduled Meds: . docusate sodium  100 mg Oral BID  . enoxaparin (LOVENOX) injection  40 mg Subcutaneous Q24H  . vancomycin  1,000 mg Intravenous Q8H   Continuous Infusions: . dextrose 5 % and 0.9% NaCl 100 mL/hr at 10/30/12 0032   PRN Meds:.HYDROmorphone (DILAUDID) injection, promethazine   Objective: Weight change:   Intake/Output Summary (Last 24 hours) at 10/30/12 2015 Last data filed at 10/30/12 1900  Gross per 24 hour  Intake   3960 ml  Output      4 ml  Net   3956 ml   Blood pressure 153/90, pulse 80, temperature 98.8 F (37.1 C), temperature source Oral, resp. rate 18, height 5\' 4"  (1.626 m), weight 206 lb 12.7 oz (93.8 kg), last menstrual period 10/07/2012, SpO2 100.00%. Temp:  [98.4 F (36.9 C)-98.8 F (37.1 C)] 98.8 F (37.1 C) (09/21 1503) Pulse Rate:  [75-88] 80 (09/21 1503) Resp:  [18] 18 (09/21 1503) BP: (134-153)/(82-90) 153/90 mmHg (09/21 1503) SpO2:  [100 %] 100 % (09/21 1503)  Physical Exam: General: Alert and awake, oriented x3, not in any acute distress. HEENT: anicteric sclera, pupils reactive to light and accommodation, EOMI CVS regular rate, normal r,  no murmur rubs or gallops Chest: clear to auscultation bilaterally, no wheezing, rales or rhonchi Abdomen: soft nontender, nondistended, normal bowel sounds, Extremities: no  clubbing or edema noted bilaterally Skin: no rashes Lymph: no new  lymphadenopathy Neuro: nonfocal  Lab Results:  Recent Labs  10/28/12 1350  WBC 5.1  HGB 12.9  HCT 38.8  PLT 298    BMET  Recent Labs  10/28/12 1216  NA 138  K 3.5  CL 102  CO2 24  GLUCOSE 87  BUN 4*  CREATININE 0.69  CALCIUM 10.4    Micro Results: Recent Results (from the past 240 hour(s))  URINE CULTURE     Status: None   Collection Time    10/26/12 12:00 PM      Result Value Range Status   Specimen Description URINE, CLEAN CATCH   Final   Special Requests NONE   Final   Culture  Setup Time     Final   Value: 10/26/2012 19:48     Performed at [facility] Count     Final   Value: >=100,000 COLONIES/ML     Performed at [facility]   Culture     Final   Value: METHICILLIN RESISTANT STAPHYLOCOCCUS AUREUS     Note: RIFAMPIN AND GENTAMICIN SHOULD NOT BE USED AS SINGLE DRUGS FOR TREATMENT OF STAPH INFECTIONS. CRITICAL RESULT CALLED TO, READ BACK BY AND VERIFIED WITH: [name] ON 9.19.14 @ 1050 BY FERGK     Performed at [facility]   Report Status 10/28/2012 FINAL   Final   Organism ID, Bacteria METHICILLIN RESISTANT STAPHYLOCOCCUS AUREUS   Final  CULTURE,  BLOOD (ROUTINE X 2)     Status: None   Collection Time    10/28/12  1:50 PM      Result Value Range Status   Specimen Description BLOOD RIGHT ANTECUBITAL   Final   Special Requests BOTTLES DRAWN AEROBIC AND ANAEROBIC 5CC   Final   Culture NO GROWTH 2 DAYS   Final   Report Status PENDING   Incomplete  CULTURE, BLOOD (ROUTINE X 2)     Status: None   Collection Time    10/28/12  1:50 PM      Result Value Range Status   Specimen Description BLOOD LEFT HAND   Final   Special Requests BOTTLES DRAWN AEROBIC AND ANAEROBIC 4CC   Final   Culture NO GROWTH 2 DAYS   Final   Report Status PENDING   Incomplete    Studies/Results: No results found.    Assessment/Plan: [name] is a 27 y.o. female with recurrent kidney stones now with MRSA urine + concern for  undiagnosed MRSA bacteremia +/- endocarditis   #1 MRSA in urine: we typically see this when pt actually is bacterermic and urine is "seeded" from blood NOT typically as a true URINARY pathogen though this CAn occur. BLood cultures were NOT drawn at time of urine cx so no way to prove one way or other with certainty but concern remains   --agree with IV vancomycin  --agree with TEE on Monday  --if TEE negative consider ? Give 2 week IV course vs FQ + rifampin  (given concern for drug seeking behavior would favor latter approach)   #2 Screening: HIV negative   Dr. Ninetta [name] back tomorrow.     LOS: 2 days   [name] 10/30/2012, 8:15 PM

## 2012-10-30 NOTE — Progress Notes (Addendum)
Chart reviewed.  Pt known to me from previous.  [facility] PROGRESS NOTE   [name] MRN:[id number] DOB: 02/10/1985 DOA: 10/28/2012 PCP: HEMBERG, KATHERINE V, RN  HPI: [name] is an 26 y.o. female with history of recurrent nephrolithiasis (8 times, she said), s/p prior stent placement and removed, asthma, ovarian cyst, was seen in the ER and diagnosed with UTI, treated with cipro, with culture grew MRSA, presents to [facility] emergency as recommended by Dr Van Damn for evaluation of possible endocarditis  Assessment/Plan: MRSA UTI  - blood cultures NGTD. For TEE with [facility] per Dr. Gherghe's note - TTE pending - ID consulted. Patient has CHRONIC RLQ PAIN.  Has had multiple visits to ed, admissions to multiple hospitals, and multiple scans over the years.   DVT Prophylaxis - Lovenox  Code Status: Full Family Communication: none  Disposition Plan: inpatient  Consultants:  ID  Cardiology for TEE  Procedures:  none  Anti-infectives   Start     Dose/Rate Route Frequency Ordered Stop   10/29/12 1700  vancomycin (VANCOCIN) IVPB 1000 mg/200 mL premix     1,000 mg 200 mL/hr over 60 Minutes Intravenous Every 8 hours 10/29/12 1620     10/28/12 1230  vancomycin (VANCOCIN) IVPB 1000 mg/200 mL premix  Status:  Discontinued     1,000 mg 200 mL/hr over 60 Minutes Intravenous  Once 10/28/12 1216 10/28/12 1958     Antibiotics Given (last 72 hours)   Date/Time Action Medication Dose Rate   10/29/12 1653 Given   vancomycin (VANCOCIN) IVPB 1000 mg/200 mL premix 1,000 mg 200 mL/hr   10/30/12 0046 Given   vancomycin (VANCOCIN) IVPB 1000 mg/200 mL premix 1,000 mg 200 mL/hr   10/30/12 1027 Given   vancomycin (VANCOCIN) IVPB 1000 mg/200 mL premix 1,000 mg 200 mL/hr     HPI/Subjective: Pain a little better.  Requesting PICC  Objective: Filed Vitals:   10/29/12 1337 10/29/12 2230 10/30/12 0657 10/30/12 1503  BP: 136/98 134/85 137/82 153/90  Pulse: 80 88 75 80  Temp:  98.1 F (36.7 C) 98.6 F (37 C) 98.4 F (36.9 C) 98.8 F (37.1 C)  TempSrc:  Axillary Oral   Resp: 20 18 18 18  Height:      Weight:      SpO2: 100% 100% 100% 100%    Intake/Output Summary (Last 24 hours) at 10/30/12 1533 Last data filed at 10/30/12 1400  Gross per 24 hour  Intake   3600 ml  Output      4 ml  Net   3596 ml   Filed Weights   10/28/12 1037 10/28/12 1855  Weight: 93.441 kg (206 lb) 93.8 kg (206 lb 12.7 oz)    Exam:   General:  Nontoxic.  Appears comfortable, and more interactive than previous hospitalizations  Cardiovascular: regular rate and rhythm, without MRG  Respiratory: good air movement, clear to auscultation throughout, no wheezing, ronchi or rales  Abdomen: soft, not tender to palpation, positive bowel sounds. obese  MSK: no peripheral edema  Data Reviewed: Basic Metabolic Panel:  Recent Labs Lab 10/26/12 1151 10/28/12 1216  NA 138 138  K 3.6 3.5  CL 101 102  CO2 27 24  GLUCOSE 82 87  BUN 5* 4*  CREATININE 0.66 0.69  CALCIUM 10.1 10.4   Liver Function Tests:  Recent Labs Lab 10/28/12 1216  AST 23  ALT 30  ALKPHOS 84  BILITOT 0.3  PROT 8.3  ALBUMIN 4.4    Recent Labs Lab   10/28/12 1216  LIPASE 42   CBC:  Recent Labs Lab 10/26/12 1151 10/28/12 1350  WBC 5.1 5.1  NEUTROABS 2.8 2.2  HGB 13.0 12.9  HCT 38.6 38.8  MCV 90.2 90.7  PLT 209 298   Recent Results (from the past 240 hour(s))  URINE CULTURE     Status: None   Collection Time    10/26/12 12:00 PM      Result Value Range Status   Specimen Description URINE, CLEAN CATCH   Final   Special Requests NONE   Final   Culture  Setup Time     Final   Value: 10/26/2012 19:48     Performed at [facility]   Colony Count     Final   Value: >=100,000 COLONIES/ML     Performed at [facility]   Culture     Final   Value: METHICILLIN RESISTANT STAPHYLOCOCCUS AUREUS     Note: RIFAMPIN AND GENTAMICIN SHOULD NOT BE USED AS SINGLE DRUGS FOR  TREATMENT OF STAPH INFECTIONS. CRITICAL RESULT CALLED TO, READ BACK BY AND VERIFIED WITH: PATTY CLARK ON 9.19.14 @ 1050 BY FERGK     Performed at [facility]   Report Status 10/28/2012 FINAL   Final   Organism ID, Bacteria METHICILLIN RESISTANT STAPHYLOCOCCUS AUREUS   Final  CULTURE, BLOOD (ROUTINE X 2)     Status: None   Collection Time    10/28/12  1:50 PM      Result Value Range Status   Specimen Description BLOOD RIGHT ANTECUBITAL   Final   Special Requests BOTTLES DRAWN AEROBIC AND ANAEROBIC 5CC   Final   Culture NO GROWTH 2 DAYS   Final   Report Status PENDING   Incomplete  CULTURE, BLOOD (ROUTINE X 2)     Status: None   Collection Time    10/28/12  1:50 PM      Result Value Range Status   Specimen Description BLOOD LEFT HAND   Final   Special Requests BOTTLES DRAWN AEROBIC AND ANAEROBIC 4CC   Final   Culture NO GROWTH 2 DAYS   Final   Report Status PENDING   Incomplete     Studies: No results found.  Scheduled Meds: . docusate sodium  100 mg Oral BID  . enoxaparin (LOVENOX) injection  40 mg Subcutaneous Q24H  . vancomycin  1,000 mg Intravenous Q8H   Continuous Infusions: . sodium chloride 100 mL/hr at 10/29/12 0633  . dextrose 5 % and 0.9% NaCl 100 mL/hr at 10/30/12 0032   Time spent: 25  Majed Pellegrin, MD [facility] Pager [phone]. If 7 PM - 7 AM, please contact night-coverage at [email or web address], password [email or web address] 10/30/2012, 3:33 PM  LOS: 2 days   

## 2012-10-31 ENCOUNTER — Encounter (HOSPITAL_COMMUNITY)
Admission: EM | Disposition: A | Discharge status: Home / Self Care | Payer: Self-pay | Source: Home / Self Care | Attending: Internal Medicine

## 2012-10-31 ENCOUNTER — Encounter (HOSPITAL_COMMUNITY): Payer: Self-pay | Admitting: *Deleted

## 2012-10-31 DIAGNOSIS — I339 Acute and subacute endocarditis, unspecified: Secondary | ICD-10-CM

## 2012-10-31 HISTORY — PX: TEE WITHOUT CARDIOVERSION: SHX5443

## 2012-10-31 SURGERY — ECHOCARDIOGRAM, TRANSESOPHAGEAL
Anesthesia: Moderate Sedation

## 2012-10-31 MED ORDER — FENTANYL CITRATE 0.05 MG/ML IJ SOLN
INTRAMUSCULAR | Status: AC
  Filled 2012-10-31: qty 2

## 2012-10-31 MED ORDER — BUTAMBEN-TETRACAINE-BENZOCAINE 2-2-14 % EX AERO
INHALATION_SPRAY | CUTANEOUS | Status: DC | PRN
  Administered 2012-10-31: 2 via TOPICAL

## 2012-10-31 MED ORDER — SULFAMETHOXAZOLE-TMP DS 800-160 MG PO TABS: 1.0000 | ORAL_TABLET | Freq: Two times a day (BID) | ORAL | Status: DC

## 2012-10-31 MED ORDER — MIDAZOLAM HCL 10 MG/2ML IJ SOLN
INTRAMUSCULAR | Status: DC | PRN
  Administered 2012-10-31 (×2): 2 mg via INTRAVENOUS

## 2012-10-31 MED ORDER — FENTANYL CITRATE 0.05 MG/ML IJ SOLN
INTRAMUSCULAR | Status: DC | PRN
  Administered 2012-10-31 (×2): 25 ug via INTRAVENOUS

## 2012-10-31 MED ORDER — OXYCODONE-ACETAMINOPHEN 5-325 MG PO TABS: 1.0000 | ORAL_TABLET | Freq: Four times a day (QID) | ORAL | Status: DC | PRN

## 2012-10-31 MED ORDER — MIDAZOLAM HCL 5 MG/ML IJ SOLN
INTRAMUSCULAR | Status: AC
  Filled 2012-10-31: qty 2

## 2012-10-31 MED ORDER — DIPHENHYDRAMINE HCL 50 MG/ML IJ SOLN
INTRAMUSCULAR | Status: AC
  Filled 2012-10-31: qty 1

## 2012-10-31 NOTE — Interval H&P Note (Signed)
History and Physical Interval Note:  10/31/2012 2:15 PM  [name]  has presented today for surgery, with the diagnosis of r/o vegitation   The various methods of treatment have been discussed with the patient and family. After consideration of risks, benefits and other options for treatment, the patient has consented to  Procedure(s): TRANSESOPHAGEAL ECHOCARDIOGRAM (TEE) (N/A) as a surgical intervention .  The patient's history has been reviewed, patient examined, no change in status, stable for surgery.  I have reviewed the patient's chart and labs.  Questions were answered to the patient's satisfaction.     Gracelee Stemmler [employer]

## 2012-10-31 NOTE — Discharge Summary (Signed)
Physician Discharge Summary  [name] ZOX:[id number] DOB: 05-07-1985 DOA: 10/28/2012  PCP: Rebecka Apley, RN  Admit date: 10/28/2012 Discharge date: 10/31/2012  Time spent: greater than 30 minutes  Discharge Diagnoses:  MRSA UTI Chronic RLQ pain obesity  Discharge condition:  stable  Filed Weights   10/28/12 1037 10/28/12 1855  Weight: 93.441 kg (206 lb) 93.8 kg (206 lb 12.7 oz)    History of present illness:  [name] is an 27 y.o. female with history of recurrent uti, nephrolithiasis, s/p prior stent placement and removed, asthma, ovarian cyst, was seen in the ER and diagnosed with UTI, treated with cipro, with culture grew MRSA.  Presents to [facility] emergency as recommended by Dr Terri Piedra for evaluation of possible endocarditis. Unless the MRSA was a contaminant, there would be concern for bacteria embolization. She has no fever, chills, but has nausea and vomiting. Her repeated UA today only showed 3 WBC's. Her abdominal pelvic CT showed no ureteral stone nor hydronephrosis, but she does has a non-obstructing renal stone. She has a normal WBC, HB, and normal renal fx tests. Hospitalist was asked to accept her in transfer for further work up.   Has had countless ED visits and admissions to various hospitals for chronic abdominal pain and UTIs.  Hospital Course:  STarted on vancomycin.  ID consulted and recommended blood cultures and TEE, both of which are negative.  Patient continued to complain of pain and nausea, but appeared comfortable, and has a bucket of [employer] in her room.  STable for discharge on bactrim per ID recs.  Procedures: TEE  Consultations:  ID  Cardiology  Discharge Exam: Filed Vitals:   10/31/12 1441  BP: 149/104  Pulse: 87  Temp: 98.3 F (36.8 C)  Resp: 17    General: comfortable, nontoxic, eating a hamburger ABD:  S, NT, ND  Discharge Instructions  Discharge Orders   Future Orders Complete By Expires   Activity as tolerated -  No restrictions  As directed    Diet general  As directed        Medication List    STOP taking these medications       cephALEXin 500 MG capsule  Commonly known as:  KEFLEX     ciprofloxacin 500 MG tablet  Commonly known as:  CIPRO      TAKE these medications       ibuprofen 800 MG tablet  Commonly known as:  ADVIL,MOTRIN  Take 800 mg by mouth daily as needed for pain.     oxyCODONE-acetaminophen 5-325 MG per tablet  Commonly known as:  PERCOCET/ROXICET  Take 1 tablet by mouth every 6 (six) hours as needed for pain.     promethazine 25 MG tablet  Commonly known as:  PHENERGAN  Take 1 tablet (25 mg total) by mouth every 6 (six) hours as needed for nausea.     sulfamethoxazole-trimethoprim 800-160 MG per tablet  Commonly known as:  BACTRIM DS  Take 1 tablet by mouth 2 (two) times daily.       Allergies  Allergen Reactions  . Iohexol Hives    ALLERGIC TO IVP DYE, HIVE, PT NEEDS PRE MEDS   . Zofran Nausea And Vomiting  . Ketorolac Tromethamine Rash  . Morphine And Related Rash       Follow-up Information   Follow up with HEMBERG, Ruby Cola, RN. (If symptoms worsen)    Specialty:  Adult Health Nurse Practitioner   Contact information:   [address]  [address] [place] [address] [phone]        The results of significant diagnostics from this hospitalization (including imaging, microbiology, ancillary and laboratory) are listed below for reference.    Significant Diagnostic Studies: Ct Abdomen Pelvis Wo Contrast  10/28/2012   *RADIOLOGY REPORT*  Clinical Data: Flank pain.  History of renal stones.  CT ABDOMEN AND PELVIS WITHOUT CONTRAST  Technique:  Multidetector CT imaging of the abdomen and pelvis was performed following the standard protocol without intravenous contrast.  Comparison: CT 05/20/2012  Findings: Visualization of the lung bases demonstrates no consolidative or nodular pulmonary opacities.  No pleural effusion. Normal heart size.  Lack of  intravenous contrast material limits evaluation of the solid organ parenchyma.  Status post cholecystectomy.  The spleen, pancreas and bilateral adrenal glands are unremarkable.  No significant interval change in size or number of multiple nonobstructing left-sided renal stones with the largest in the inferior pole measuring 6 mm.  No ureterolithiasis.  No hydronephrosis.  Urinary bladder is grossly unremarkable.  Normal caliber abdominal aorta.  No retroperitoneal, pelvic or mesenteric lymphadenopathy.  Uterus is grossly unremarkable.  No abnormal bowel wall thickening.  No bowel obstruction.  No free fluid or free intraperitoneal air.  Postsurgical change compatible with appendectomy.  No aggressive or acute appearing osseous lesions.  IMPRESSION: No hydronephrosis or evidence for ureterolithiasis.  Unchanged nonobstructing left renal stones.   Original Report Authenticated By: Annia Belt, M.D   Dg Chest 2 View  10/28/2012   CLINICAL DATA:  Possible endocarditis.  Asthma.  EXAM: CHEST  2 VIEW  COMPARISON:  Single view of the chest 07/21/2011.  FINDINGS: The heart size and mediastinal contours are within normal limits. Both lungs are clear. The visualized skeletal structures are unremarkable.  IMPRESSION: No active cardiopulmonary disease.   Electronically Signed   By: Drusilla Kanner M.D.   On: 10/28/2012 14:43   US Renal  10/26/2012   CLINICAL DATA:  Right flank pain.  EXAM: RENAL/URINARY TRACT ULTRASOUND COMPLETE  COMPARISON:  CT 12/28/2011  FINDINGS: Right Kidney  Length: 10.6 cm. Previously seen punctate renal stones not visualized. Echogenicity within normal limits. No mass or hydronephrosis visualized.  Left Kidney  Length: 11.6 cm. Previously seen punctate renal stones not visualized. Echogenicity within normal limits. No mass or hydronephrosis visualized.  Bladder: Appears normal for degree of bladder distention. Bilateral ureteral jets noted.  IMPRESSION: No acute findings. No hydronephrosis.    Electronically Signed   By: Charlett Nose M.D.   On: 10/26/2012 12:54   Transesohpageal echo Left ventricle: The cavity size was normal. Wall thickness was normal. Systolic function was normal. The estimated ejection fraction was 55%. Wall motion was normal; there were no regional wall motion abnormalities. - Aortic valve: No evidence of vegetation. There was no stenosis. - Aorta: Normal caliber. - Mitral valve: No evidence of vegetation. Trivial regurgitation. - Left atrium: No evidence of thrombus in the atrial cavity or appendage. - Right ventricle: The cavity size was normal. Systolic function was normal. - Right atrium: No evidence of thrombus in the atrial cavity or appendage. - Tricuspid valve: No evidence of vegetation. - Pulmonic valve: No evidence of vegetation. Impressions:  - No evidence of endocarditis.   Microbiology: Recent Results (from the past 240 hour(s))  URINE CULTURE     Status: None   Collection Time    10/26/12 12:00 PM      Result Value Range Status   Specimen Description URINE, CLEAN CATCH   Final  Special Requests NONE   Final   Culture  Setup Time     Final   Value: 10/26/2012 19:48     Performed at [facility] Count     Final   Value: >=100,000 COLONIES/ML     Performed at [facility]   Culture     Final   Value: METHICILLIN RESISTANT STAPHYLOCOCCUS AUREUS     Note: RIFAMPIN AND GENTAMICIN SHOULD NOT BE USED AS SINGLE DRUGS FOR TREATMENT OF STAPH INFECTIONS. CRITICAL RESULT CALLED TO, READ BACK BY AND VERIFIED WITH: PATTY CLARK ON 9.19.14 @ 1050 BY FERGK     Performed at [facility]   Report Status 10/28/2012 FINAL   Final   Organism ID, Bacteria METHICILLIN RESISTANT STAPHYLOCOCCUS AUREUS   Final  CULTURE, BLOOD (ROUTINE X 2)     Status: None   Collection Time    10/28/12  1:50 PM      Result Value Range Status   Specimen Description BLOOD RIGHT ANTECUBITAL   Final   Special Requests BOTTLES DRAWN  AEROBIC AND ANAEROBIC 5CC   Final   Culture NO GROWTH 3 DAYS   Final   Report Status PENDING   Incomplete  CULTURE, BLOOD (ROUTINE X 2)     Status: None   Collection Time    10/28/12  1:50 PM      Result Value Range Status   Specimen Description BLOOD LEFT HAND   Final   Special Requests BOTTLES DRAWN AEROBIC AND ANAEROBIC 4CC   Final   Culture NO GROWTH 3 DAYS   Final   Report Status PENDING   Incomplete  URINE CULTURE     Status: None   Collection Time    10/29/12  2:51 PM      Result Value Range Status   Specimen Description URINE, CLEAN CATCH   Final   Special Requests NONE   Final   Culture  Setup Time     Final   Value: 10/30/2012 02:17     Performed at [facility] Count     Final   Value: >=100,000 COLONIES/ML     Performed at [facility]   Culture     Final   Value: STAPHYLOCOCCUS AUREUS     Note: RIFAMPIN AND GENTAMICIN SHOULD NOT BE USED AS SINGLE DRUGS FOR TREATMENT OF STAPH INFECTIONS.     Performed at [facility]   Report Status PENDING   Incomplete     Labs: Basic Metabolic Panel:  Recent Labs Lab 10/26/12 1151 10/28/12 1216  NA 138 138  K 3.6 3.5  CL 101 102  CO2 27 24  GLUCOSE 82 87  BUN 5* 4*  CREATININE 0.66 0.69  CALCIUM 10.1 10.4   Liver Function Tests:  Recent Labs Lab 10/28/12 1216  AST 23  ALT 30  ALKPHOS 84  BILITOT 0.3  PROT 8.3  ALBUMIN 4.4    Recent Labs Lab 10/28/12 1216  LIPASE 42   No results found for this basename: AMMONIA,  in the last 168 hours CBC:  Recent Labs Lab 10/26/12 1151 10/28/12 1350  WBC 5.1 5.1  NEUTROABS 2.8 2.2  HGB 13.0 12.9  HCT 38.6 38.8  MCV 90.2 90.7  PLT 209 298   Cardiac Enzymes: No results found for this basename: CKTOTAL, CKMB, CKMBINDEX, TROPONINI,  in the last 168 hours BNP: BNP (last 3 results) No results found for this basename: PROBNP,  in the  last 8760 hours CBG: No results found for this basename: GLUCAP,  in the last 168  hours     Signed:  Maxcine Strong L  [facility] 10/31/2012, 3:48 PM

## 2012-10-31 NOTE — CV Procedure (Signed)
Procedure: TEE  Indication: R/o endocarditis  Sedation: Versed 4 mg IV, Fentanyl 50 mcg IV  Findings: Please see echo section for full report.  Normal LV size and systolic function, EF 55%.  Normal RV size and systolic function.  No significant valvular abnormalities noted.  No endocarditis.    No complications  [name] 10/31/2012 2:31 PM

## 2012-10-31 NOTE — H&P (View-Only) (Signed)
Chart reviewed.  Pt known to me from previous.  [facility] PROGRESS NOTE   ZAILYN THOENNES ZOX:[id number] DOB: 18-May-1985 DOA: 10/28/2012 PCP: Rebecka Apley, RN  HPI: [name] is an 27 y.o. female with history of recurrent nephrolithiasis (8 times, she said), s/p prior stent placement and removed, asthma, ovarian cyst, was seen in the ER and diagnosed with UTI, treated with cipro, with culture grew MRSA, presents to [facility] emergency as recommended by Dr Terri Piedra for evaluation of possible endocarditis  Assessment/Plan: MRSA UTI  - blood cultures NGTD. For TEE with [facility] per Dr. Charlean Sanfilippo note - TTE pending - ID consulted. Patient has CHRONIC RLQ PAIN.  Has had multiple visits to ed, admissions to multiple hospitals, and multiple scans over the years.   DVT Prophylaxis - Lovenox  Code Status: Full Family Communication: none  Disposition Plan: inpatient  Consultants:  ID  Cardiology for TEE  Procedures:  none  Anti-infectives   Start     Dose/Rate Route Frequency Ordered Stop   10/29/12 1700  vancomycin (VANCOCIN) IVPB 1000 mg/200 mL premix     1,000 mg 200 mL/hr over 60 Minutes Intravenous Every 8 hours 10/29/12 1620     10/28/12 1230  vancomycin (VANCOCIN) IVPB 1000 mg/200 mL premix  Status:  Discontinued     1,000 mg 200 mL/hr over 60 Minutes Intravenous  Once 10/28/12 1216 10/28/12 1958     Antibiotics Given (last 72 hours)   Date/Time Action Medication Dose Rate   10/29/12 1653 Given   vancomycin (VANCOCIN) IVPB 1000 mg/200 mL premix 1,000 mg 200 mL/hr   10/30/12 0046 Given   vancomycin (VANCOCIN) IVPB 1000 mg/200 mL premix 1,000 mg 200 mL/hr   10/30/12 1027 Given   vancomycin (VANCOCIN) IVPB 1000 mg/200 mL premix 1,000 mg 200 mL/hr     HPI/Subjective: Pain a little better.  Requesting PICC  Objective: Filed Vitals:   10/29/12 1337 10/29/12 2230 10/30/12 0657 10/30/12 1503  BP: 136/98 134/85 137/82 153/90  Pulse: 80 88 75 80  Temp:  98.1 F (36.7 C) 98.6 F (37 C) 98.4 F (36.9 C) 98.8 F (37.1 C)  TempSrc:  Axillary Oral   Resp: 20 18 18 18   Height:      Weight:      SpO2: 100% 100% 100% 100%    Intake/Output Summary (Last 24 hours) at 10/30/12 1533 Last data filed at 10/30/12 1400  Gross per 24 hour  Intake   3600 ml  Output      4 ml  Net   3596 ml   Filed Weights   10/28/12 1037 10/28/12 1855  Weight: 93.441 kg (206 lb) 93.8 kg (206 lb 12.7 oz)    Exam:   General:  Nontoxic.  Appears comfortable, and more interactive than previous hospitalizations  Cardiovascular: regular rate and rhythm, without MRG  Respiratory: good air movement, clear to auscultation throughout, no wheezing, ronchi or rales  Abdomen: soft, not tender to palpation, positive bowel sounds. obese  MSK: no peripheral edema  Data Reviewed: Basic Metabolic Panel:  Recent Labs Lab 10/26/12 1151 10/28/12 1216  NA 138 138  K 3.6 3.5  CL 101 102  CO2 27 24  GLUCOSE 82 87  BUN 5* 4*  CREATININE 0.66 0.69  CALCIUM 10.1 10.4   Liver Function Tests:  Recent Labs Lab 10/28/12 1216  AST 23  ALT 30  ALKPHOS 84  BILITOT 0.3  PROT 8.3  ALBUMIN 4.4    Recent Labs Lab  10/28/12 1216  LIPASE 42   CBC:  Recent Labs Lab 10/26/12 1151 10/28/12 1350  WBC 5.1 5.1  NEUTROABS 2.8 2.2  HGB 13.0 12.9  HCT 38.6 38.8  MCV 90.2 90.7  PLT 209 298   Recent Results (from the past 240 hour(s))  URINE CULTURE     Status: None   Collection Time    10/26/12 12:00 PM      Result Value Range Status   Specimen Description URINE, CLEAN CATCH   Final   Special Requests NONE   Final   Culture  Setup Time     Final   Value: 10/26/2012 19:48     Performed at [facility] Count     Final   Value: >=100,000 COLONIES/ML     Performed at [facility]   Culture     Final   Value: METHICILLIN RESISTANT STAPHYLOCOCCUS AUREUS     Note: RIFAMPIN AND GENTAMICIN SHOULD NOT BE USED AS SINGLE DRUGS FOR  TREATMENT OF STAPH INFECTIONS. CRITICAL RESULT CALLED TO, READ BACK BY AND VERIFIED WITH: PATTY CLARK ON 9.19.14 @ 1050 BY FERGK     Performed at [facility]   Report Status 10/28/2012 FINAL   Final   Organism ID, Bacteria METHICILLIN RESISTANT STAPHYLOCOCCUS AUREUS   Final  CULTURE, BLOOD (ROUTINE X 2)     Status: None   Collection Time    10/28/12  1:50 PM      Result Value Range Status   Specimen Description BLOOD RIGHT ANTECUBITAL   Final   Special Requests BOTTLES DRAWN AEROBIC AND ANAEROBIC 5CC   Final   Culture NO GROWTH 2 DAYS   Final   Report Status PENDING   Incomplete  CULTURE, BLOOD (ROUTINE X 2)     Status: None   Collection Time    10/28/12  1:50 PM      Result Value Range Status   Specimen Description BLOOD LEFT HAND   Final   Special Requests BOTTLES DRAWN AEROBIC AND ANAEROBIC 4CC   Final   Culture NO GROWTH 2 DAYS   Final   Report Status PENDING   Incomplete     Studies: No results found.  Scheduled Meds: . docusate sodium  100 mg Oral BID  . enoxaparin (LOVENOX) injection  40 mg Subcutaneous Q24H  . vancomycin  1,000 mg Intravenous Q8H   Continuous Infusions: . sodium chloride 100 mL/hr at 10/29/12 1610  . dextrose 5 % and 0.9% NaCl 100 mL/hr at 10/30/12 0032   Time spent: 25  Crista Curb, MD [facility] Pager [phone]. If 7 PM - 7 AM, please contact night-coverage at [email or web address], password [facility] 10/30/2012, 3:33 PM  LOS: 2 days

## 2012-10-31 NOTE — Progress Notes (Signed)
Echocardiogram Echocardiogram Transesophageal has been performed.  Dorothey Baseman 10/31/2012, 3:13 PM

## 2012-10-31 NOTE — Progress Notes (Signed)
INFECTIOUS DISEASE PROGRESS NOTE  ID: Britiney Blahnik Matheson is a 27 y.o. female with  Active Problems:   Nephrolithiasis   Nausea   UTI (lower urinary tract infection)   Flank pain  Subjective: C/o back pain.   Abtx:  Anti-infectives   Start     Dose/Rate Route Frequency Ordered Stop   10/29/12 1700  vancomycin (VANCOCIN) IVPB 1000 mg/200 mL premix     1,000 mg 200 mL/hr over 60 Minutes Intravenous Every 8 hours 10/29/12 1620     10/28/12 1230  vancomycin (VANCOCIN) IVPB 1000 mg/200 mL premix  Status:  Discontinued     1,000 mg 200 mL/hr over 60 Minutes Intravenous  Once 10/28/12 1216 10/28/12 1958      Medications:  Scheduled: . docusate sodium  100 mg Oral BID  . enoxaparin (LOVENOX) injection  40 mg Subcutaneous Q24H  . vancomycin  1,000 mg Intravenous Q8H    Objective: Vital signs in last 24 hours: Temp:  [98.2 F (36.8 C)-98.3 F (36.8 C)] 98.3 F (36.8 C) (09/22 1441) Pulse Rate:  [81-93] 87 (09/22 1441) Resp:  [10-20] 17 (09/22 1441) BP: (147-179)/(90-125) 149/104 mmHg (09/22 1441) SpO2:  [99 %-100 %] 100 % (09/22 1441)   General appearance: alert and no distress Resp: clear to auscultation bilaterally Cardio: regular rate and rhythm GI: normal findings: bowel sounds normal and soft, non-tender  Lab Results No results found for this basename: WBC, HGB, HCT, PLATELETS, NA, K, CL, CO2, BUN, CREATININE, GLU,  in the last 72 hours Liver Panel No results found for this basename: PROT, ALBUMIN, AST, ALT, ALKPHOS, BILITOT, BILIDIR, IBILI,  in the last 72 hours Sedimentation Rate No results found for this basename: ESRSEDRATE,  in the last 72 hours C-Reactive Protein No results found for this basename: CRP,  in the last 72 hours  Microbiology: Recent Results (from the past 240 hour(s))  URINE CULTURE     Status: None   Collection Time    10/26/12 12:00 PM      Result Value Range Status   Specimen Description URINE, CLEAN CATCH   Final   Special Requests  NONE   Final   Culture  Setup Time     Final   Value: 10/26/2012 19:48     Performed at [facility] Count     Final   Value: >=100,000 COLONIES/ML     Performed at [facility]   Culture     Final   Value: METHICILLIN RESISTANT STAPHYLOCOCCUS AUREUS     Note: RIFAMPIN AND GENTAMICIN SHOULD NOT BE USED AS SINGLE DRUGS FOR TREATMENT OF STAPH INFECTIONS. CRITICAL RESULT CALLED TO, READ BACK BY AND VERIFIED WITH: PATTY CLARK ON 9.19.14 @ 1050 BY FERGK     Performed at [facility]   Report Status 10/28/2012 FINAL   Final   Organism ID, Bacteria METHICILLIN RESISTANT STAPHYLOCOCCUS AUREUS   Final  CULTURE, BLOOD (ROUTINE X 2)     Status: None   Collection Time    10/28/12  1:50 PM      Result Value Range Status   Specimen Description BLOOD RIGHT ANTECUBITAL   Final   Special Requests BOTTLES DRAWN AEROBIC AND ANAEROBIC 5CC   Final   Culture NO GROWTH 3 DAYS   Final   Report Status PENDING   Incomplete  CULTURE, BLOOD (ROUTINE X 2)     Status: None   Collection Time    10/28/12  1:50 PM  Result Value Range Status   Specimen Description BLOOD LEFT HAND   Final   Special Requests BOTTLES DRAWN AEROBIC AND ANAEROBIC 4CC   Final   Culture NO GROWTH 3 DAYS   Final   Report Status PENDING   Incomplete  URINE CULTURE     Status: None   Collection Time    10/29/12  2:51 PM      Result Value Range Status   Specimen Description URINE, CLEAN CATCH   Final   Special Requests NONE   Final   Culture  Setup Time     Final   Value: 10/30/2012 02:17     Performed at [facility] Count     Final   Value: >=100,000 COLONIES/ML     Performed at [facility]   Culture     Final   Value: STAPHYLOCOCCUS AUREUS     Note: RIFAMPIN AND GENTAMICIN SHOULD NOT BE USED AS SINGLE DRUGS FOR TREATMENT OF STAPH INFECTIONS.     Performed at [facility]   Report Status PENDING   Incomplete    Studies/Results: No results  found.   Assessment/Plan: MRSA UTI (9-17) TEE (-) BCx ngtd  Total days of antibiotics: 4 (vancomycin)  Consider d/c home on bactrim (10 days) F/u BCx to completion.          Johny Sax Infectious Diseases (pager) [phone] [email or web address] 10/31/2012, 3:37 PM  LOS: 3 days

## 2012-11-01 ENCOUNTER — Encounter (HOSPITAL_COMMUNITY): Payer: Self-pay | Admitting: Cardiology

## 2012-11-01 LAB — URINE CULTURE

## 2012-11-02 LAB — CULTURE, BLOOD (ROUTINE X 2)
Culture: NO GROWTH
Culture: NO GROWTH

## 2013-02-22 ENCOUNTER — Emergency Department (HOSPITAL_COMMUNITY)
Admission: EM | Admit: 2013-02-22 | Discharge: 2013-02-23 | Disposition: A | Discharge status: Home / Self Care | Payer: Medicaid Other | Attending: Emergency Medicine | Admitting: Emergency Medicine

## 2013-02-22 ENCOUNTER — Encounter (HOSPITAL_COMMUNITY): Payer: Self-pay | Admitting: Emergency Medicine

## 2013-02-22 DIAGNOSIS — Z3202 Encounter for pregnancy test, result negative: Secondary | ICD-10-CM | POA: Insufficient documentation

## 2013-02-22 DIAGNOSIS — G8929 Other chronic pain: Secondary | ICD-10-CM | POA: Insufficient documentation

## 2013-02-22 DIAGNOSIS — Z79899 Other long term (current) drug therapy: Secondary | ICD-10-CM | POA: Insufficient documentation

## 2013-02-22 DIAGNOSIS — J45909 Unspecified asthma, uncomplicated: Secondary | ICD-10-CM | POA: Insufficient documentation

## 2013-02-22 DIAGNOSIS — F17200 Nicotine dependence, unspecified, uncomplicated: Secondary | ICD-10-CM | POA: Insufficient documentation

## 2013-02-22 DIAGNOSIS — Z87442 Personal history of urinary calculi: Secondary | ICD-10-CM | POA: Insufficient documentation

## 2013-02-22 DIAGNOSIS — N390 Urinary tract infection, site not specified: Secondary | ICD-10-CM

## 2013-02-22 LAB — URINALYSIS, ROUTINE W REFLEX MICROSCOPIC
Bilirubin Urine: NEGATIVE
Glucose, UA: NEGATIVE mg/dL
Ketones, ur: NEGATIVE mg/dL
NITRITE: POSITIVE — AB
PH: 6.5 (ref 5.0–8.0)
Protein, ur: NEGATIVE mg/dL
SPECIFIC GRAVITY, URINE: 1.015 (ref 1.005–1.030)
Urobilinogen, UA: 0.2 mg/dL (ref 0.0–1.0)

## 2013-02-22 LAB — CBC
HCT: 37.8 % (ref 36.0–46.0)
Hemoglobin: 13 g/dL (ref 12.0–15.0)
MCH: 30.6 pg (ref 26.0–34.0)
MCHC: 34.4 g/dL (ref 30.0–36.0)
MCV: 88.9 fL (ref 78.0–100.0)
PLATELETS: 194 10*3/uL (ref 150–400)
RBC: 4.25 MIL/uL (ref 3.87–5.11)
RDW: 13.4 % (ref 11.5–15.5)
WBC: 6.2 10*3/uL (ref 4.0–10.5)

## 2013-02-22 LAB — URINE MICROSCOPIC-ADD ON

## 2013-02-22 LAB — COMPREHENSIVE METABOLIC PANEL
ALT: 22 U/L (ref 0–35)
AST: 19 U/L (ref 0–37)
Albumin: 4 g/dL (ref 3.5–5.2)
Alkaline Phosphatase: 73 U/L (ref 39–117)
BILIRUBIN TOTAL: 0.3 mg/dL (ref 0.3–1.2)
BUN: 8 mg/dL (ref 6–23)
CHLORIDE: 101 meq/L (ref 96–112)
CO2: 19 meq/L (ref 19–32)
Calcium: 9.6 mg/dL (ref 8.4–10.5)
Creatinine, Ser: 0.72 mg/dL (ref 0.50–1.10)
GFR calc Af Amer: >90 mL/min (ref >90)
Glucose, Bld: 98 mg/dL (ref 70–99)
POTASSIUM: 4 meq/L (ref 3.7–5.3)
Sodium: 135 mEq/L — ABNORMAL LOW (ref 137–147)
Total Protein: 7.8 g/dL (ref 6.0–8.3)

## 2013-02-22 LAB — LIPASE, BLOOD: Lipase: 45 U/L (ref 11–59)

## 2013-02-22 LAB — PREGNANCY, URINE: PREG TEST UR: NEGATIVE

## 2013-02-22 MED ORDER — HYDROMORPHONE HCL PF 1 MG/ML IJ SOLN
1.0000 mg | Freq: Once | INTRAMUSCULAR | Status: AC
  Administered 2013-02-22: 1 mg via INTRAMUSCULAR
  Filled 2013-02-22: qty 1

## 2013-02-22 MED ORDER — PROMETHAZINE HCL 25 MG/ML IJ SOLN
25.0000 mg | Freq: Once | INTRAMUSCULAR | Status: AC
  Administered 2013-02-22: 25 mg via INTRAMUSCULAR
  Filled 2013-02-22: qty 1

## 2013-02-22 NOTE — ED Notes (Signed)
Pt c/o lower abd pain since Monday

## 2013-02-22 NOTE — ED Provider Notes (Signed)
CSN: [id number]     Arrival date & time 02/22/13  2222 History  This chart was scribed for Sunnie NielsenBrian Saban Heinlen, MD by Ardelia Memsylan Malpass, ED Scribe. This patient was seen in room APA07/APA07 and the patient's care was started at 11:31 PM.   Chief Complaint  Patient presents with  . Abdominal Pain    Patient is a 28 y.o. female presenting with abdominal pain. The history is provided by the patient. No language interpreter was used.  Abdominal Pain Pain location:  Suprapubic Pain quality: cramping   Pain radiates to:  Does not radiate Pain severity:  Severe Onset quality:  Gradual Duration:  2 days Timing:  Intermittent Progression:  Worsening Chronicity:  New Context comment:  Recent UTI, hx of ovarian cyst Relieved by:  Nothing Worsened by:  Nothing tried Associated symptoms: dysuria   Associated symptoms: no fever   Risk factors: not pregnant     HPI Comments: [name] is a 28 y.o. female who presents to the Emergency Department complaining of gradually worsening suprapubic abdominal pain onset 2 nights ago. She describes her pain as "cramping" and "feeling like contractions". She reports associated difficulty urinating, and pressure with urinating. She states that her LMP was 12/25, and that this was the first menstrual period she had since 5 months prior. She states that she has a history of an ovarian cyst and that her OB-GYN is Dr. Emelda FearFerguson. She also states that she has a history of kidney stones. She states that she has been taking Cipro for the past 5 days, after seeing her Urologist 5 days ago with UTI symptoms. She denies fever or any other pain or symptoms.  . Past Medical History  Diagnosis Date  . Ovarian cyst   . UTI (lower urinary tract infection)   . Asthma   . Kidney stones   . Calcium oxalate renal stones   . Chronic pelvic pain in female    Past Surgical History  Procedure Laterality Date  . Ovarian cyst removal    . Lithotripsy    . Laparoscopic tubal ligation     . Cholecystectomy    . Appendectomy    . Tee without cardioversion N/A 10/31/2012    Procedure: TRANSESOPHAGEAL ECHOCARDIOGRAM (TEE);  Surgeon: Laurey Moralealton S McLean, MD;  [facility];  Service: Cardiovascular;  Laterality: N/A;   Family History  Problem Relation Age of Onset  . Diabetes Other   . Seizures Other   . Cancer Other    History  Substance Use Topics  . Smoking status: Current Every Day Smoker -- 0.50 packs/day for 14 years    Types: Cigarettes  . Smokeless tobacco: Never Used  . Alcohol Use: No   OB History   Grav Para Term Preterm Abortions TAB SAB Ect Mult Living   13 2 1 1 10  9 1  2      Review of Systems  Constitutional: Negative for fever.  Gastrointestinal: Positive for abdominal pain.  Genitourinary: Positive for dysuria.  All other systems reviewed and are negative.   Allergies  Iohexol; Zofran; Ketorolac tromethamine; and Morphine and related  Home Medications   Current Outpatient Rx  Name  Route  Sig  Dispense  Refill  . ibuprofen (ADVIL,MOTRIN) 800 MG tablet   Oral   Take 800 mg by mouth daily as needed for pain.         Marland Kitchen. oxyCODONE-acetaminophen (PERCOCET/ROXICET) 5-325 MG per tablet   Oral   Take 1 tablet by mouth every 6 (  six) hours as needed for pain.   14 tablet   0   . promethazine (PHENERGAN) 25 MG tablet   Oral   Take 1 tablet (25 mg total) by mouth every 6 (six) hours as needed for nausea.   10 tablet   0   . sulfamethoxazole-trimethoprim (BACTRIM DS) 800-160 MG per tablet   Oral   Take 1 tablet by mouth 2 (two) times daily.   20 tablet   0    Triage Vitals: BP 157/91  Pulse 84  Temp(Src) 98.3 F (36.8 C) (Oral)  Resp 24  Ht 5\' 4"  (1.626 m)  Wt 210 lb (95.255 kg)  BMI 36.03 kg/m2  SpO2 100%  LMP 02/02/2013  Physical Exam  Nursing note and vitals reviewed. Constitutional: She is oriented to person, place, and time. She appears well-developed and well-nourished. No distress.  HENT:  Head: Normocephalic  and atraumatic.  Eyes: EOM are normal.  Neck: Neck supple. No tracheal deviation present.  Cardiovascular: Normal rate.   Pulmonary/Chest: Effort normal. No respiratory distress.  Abdominal: There is tenderness.  Mild suprapubic tenderness, otherwise non-tender.  Genitourinary:  No CVA tenderness.  Musculoskeletal: Normal range of motion.  Neurological: She is alert and oriented to person, place, and time.  Skin: Skin is warm and dry.  Psychiatric: She has a normal mood and affect. Her behavior is normal.    ED Course  Procedures (including critical care time)  DIAGNOSTIC STUDIES: Oxygen Saturation is 100% on RA, normal by my interpretation.    COORDINATION OF CARE: 11:37 PM- Will order Phenergan and Dilaudid. Will obtain diagnostic lab work. Pt advised of plan for treatment and pt agrees.  Medications  promethazine (PHENERGAN) injection 25 mg (25 mg Intramuscular Given 02/22/13 2346)  HYDROmorphone (DILAUDID) injection 1 mg (1 mg Intramuscular Given 02/22/13 2346)   Labs Review Labs Reviewed  URINALYSIS, ROUTINE W REFLEX MICROSCOPIC - Abnormal; Notable for the following:    Hgb urine dipstick TRACE (*)    Nitrite POSITIVE (*)    Leukocytes, UA SMALL (*)    All other components within normal limits  COMPREHENSIVE METABOLIC PANEL - Abnormal; Notable for the following:    Sodium 135 (*)    All other components within normal limits  URINE MICROSCOPIC-ADD ON - Abnormal; Notable for the following:    Bacteria, UA MANY (*)    All other components within normal limits  PREGNANCY, URINE  CBC  LIPASE, BLOOD   Dilaudid IM, Rocephin IM, Phenergan IM, Pyridium by mouth  Pain improved with medications. Plan discharge home with prescription for Vantin with urine culture pending. No fever or vomiting. Patient tolerates by mouth fluids. She agrees to return precautions and close primary care followup.  MDM  Diagnosis: UTI  Evaluated with urinalysis labs as above. Medications  provided including IM narcotics. Failed outpatient treatment with Cipro, no pyelonephritis. Vital signs nurse's notes reviewed and considered.   I personally performed the services described in this documentation, which was scribed in my presence. The recorded information has been reviewed and is accurate.     Sunnie Nielsen, MD 02/23/13 [phone]

## 2013-02-23 MED ORDER — CEFPODOXIME PROXETIL 100 MG PO TABS: 100.0000 mg | ORAL_TABLET | Freq: Two times a day (BID) | ORAL | Status: DC

## 2013-02-23 MED ORDER — PROMETHAZINE HCL 25 MG PO TABS: 25.0000 mg | ORAL_TABLET | Freq: Four times a day (QID) | ORAL | Status: DC | PRN

## 2013-02-23 MED ORDER — CEFTRIAXONE SODIUM 1 G IJ SOLR
1.0000 g | Freq: Once | INTRAMUSCULAR | Status: AC
  Administered 2013-02-23: 1 g via INTRAMUSCULAR
  Filled 2013-02-23: qty 10

## 2013-02-23 MED ORDER — LIDOCAINE HCL (PF) 1 % IJ SOLN
INTRAMUSCULAR | Status: AC
  Administered 2013-02-23: 2.1 mL
  Filled 2013-02-23: qty 5

## 2013-02-23 MED ORDER — PHENAZOPYRIDINE HCL 100 MG PO TABS
200.0000 mg | ORAL_TABLET | Freq: Once | ORAL | Status: AC
  Administered 2013-02-23: 200 mg via ORAL
  Filled 2013-02-23: qty 2

## 2013-02-23 NOTE — Discharge Instructions (Signed)
Urinary Tract Infection  Urinary tract infections (UTIs) can develop anywhere along your urinary tract. Your urinary tract is your body's drainage system for removing wastes and extra water. Your urinary tract includes two kidneys, two ureters, a bladder, and a urethra. Your kidneys are a pair of bean-shaped organs. Each kidney is about the size of your fist. They are located below your ribs, one on each side of your spine.  CAUSES  Infections are caused by microbes, which are microscopic organisms, including fungi, viruses, and bacteria. These organisms are so small that they can only be seen through a microscope. Bacteria are the microbes that most commonly cause UTIs.  SYMPTOMS   Symptoms of UTIs may vary by age and gender of the patient and by the location of the infection. Symptoms in young women typically include a frequent and intense urge to urinate and a painful, burning feeling in the bladder or urethra during urination. Older women and men are more likely to be tired, shaky, and weak and have muscle aches and abdominal pain. A fever may mean the infection is in your kidneys. Other symptoms of a kidney infection include pain in your back or sides below the ribs, nausea, and vomiting.  DIAGNOSIS  To diagnose a UTI, your caregiver will ask you about your symptoms. Your caregiver also will ask to provide a urine sample. The urine sample will be tested for bacteria and white blood cells. White blood cells are made by your body to help fight infection.  TREATMENT   Typically, UTIs can be treated with medication. Because most UTIs are caused by a bacterial infection, they usually can be treated with the use of antibiotics. The choice of antibiotic and length of treatment depend on your symptoms and the type of bacteria causing your infection.  HOME CARE INSTRUCTIONS   If you were prescribed antibiotics, take them exactly as your caregiver instructs you. Finish the medication even if you feel better after you  have only taken some of the medication.   Drink enough water and fluids to keep your urine clear or pale yellow.   Avoid caffeine, tea, and carbonated beverages. They tend to irritate your bladder.   Empty your bladder often. Avoid holding urine for long periods of time.   Empty your bladder before and after sexual intercourse.   After a bowel movement, women should cleanse from front to back. Use each tissue only once.  SEEK MEDICAL CARE IF:    You have back pain.   You develop a fever.   Your symptoms do not begin to resolve within 3 days.  SEEK IMMEDIATE MEDICAL CARE IF:    You have severe back pain or lower abdominal pain.   You develop chills.   You have nausea or vomiting.   You have continued burning or discomfort with urination.  MAKE SURE YOU:    Understand these instructions.   Will watch your condition.   Will get help right away if you are not doing well or get worse.  Document Released: 11/05/2004 Document Revised: 07/28/2011 Document Reviewed: 03/06/2011  [employer] Patient Information 2014 [employer].

## 2013-04-30 ENCOUNTER — Encounter (HOSPITAL_COMMUNITY): Payer: Self-pay | Admitting: Emergency Medicine

## 2013-04-30 ENCOUNTER — Emergency Department (HOSPITAL_COMMUNITY): Payer: Medicaid Other

## 2013-04-30 ENCOUNTER — Emergency Department (HOSPITAL_COMMUNITY)
Admission: EM | Admit: 2013-04-30 | Discharge: 2013-04-30 | Disposition: A | Discharge status: Home / Self Care | Payer: Medicaid Other | Attending: Emergency Medicine | Admitting: Emergency Medicine

## 2013-04-30 DIAGNOSIS — G8929 Other chronic pain: Secondary | ICD-10-CM | POA: Insufficient documentation

## 2013-04-30 DIAGNOSIS — J45909 Unspecified asthma, uncomplicated: Secondary | ICD-10-CM | POA: Insufficient documentation

## 2013-04-30 DIAGNOSIS — R112 Nausea with vomiting, unspecified: Secondary | ICD-10-CM | POA: Insufficient documentation

## 2013-04-30 DIAGNOSIS — Z87442 Personal history of urinary calculi: Secondary | ICD-10-CM | POA: Insufficient documentation

## 2013-04-30 DIAGNOSIS — N390 Urinary tract infection, site not specified: Secondary | ICD-10-CM

## 2013-04-30 DIAGNOSIS — R000 Tachycardia, unspecified: Secondary | ICD-10-CM | POA: Insufficient documentation

## 2013-04-30 DIAGNOSIS — Z79899 Other long term (current) drug therapy: Secondary | ICD-10-CM | POA: Insufficient documentation

## 2013-04-30 DIAGNOSIS — Z3202 Encounter for pregnancy test, result negative: Secondary | ICD-10-CM | POA: Insufficient documentation

## 2013-04-30 DIAGNOSIS — F17200 Nicotine dependence, unspecified, uncomplicated: Secondary | ICD-10-CM | POA: Insufficient documentation

## 2013-04-30 DIAGNOSIS — Z792 Long term (current) use of antibiotics: Secondary | ICD-10-CM | POA: Insufficient documentation

## 2013-04-30 DIAGNOSIS — Z8742 Personal history of other diseases of the female genital tract: Secondary | ICD-10-CM | POA: Insufficient documentation

## 2013-04-30 LAB — CBC WITH DIFFERENTIAL/PLATELET
Basophils Absolute: 0 10*3/uL (ref 0.0–0.1)
Basophils Relative: 1 % (ref 0–1)
Eosinophils Absolute: 0.1 10*3/uL (ref 0.0–0.7)
Eosinophils Relative: 1 % (ref 0–5)
HCT: 41.8 % (ref 36.0–46.0)
Hemoglobin: 13.3 g/dL (ref 12.0–15.0)
Lymphocytes Relative: 31 % (ref 12–46)
Lymphs Abs: 1.6 10*3/uL (ref 0.7–4.0)
MCH: 30.2 pg (ref 26.0–34.0)
MCHC: 31.8 g/dL (ref 30.0–36.0)
MCV: 94.8 fL (ref 78.0–100.0)
Monocytes Absolute: 0.2 10*3/uL (ref 0.1–1.0)
Monocytes Relative: 4 % (ref 3–12)
Neutro Abs: 3.1 10*3/uL (ref 1.7–7.7)
Neutrophils Relative %: 62 % (ref 43–77)
Platelets: 286 10*3/uL (ref 150–400)
RBC: 4.41 MIL/uL (ref 3.87–5.11)
RDW: 13.6 % (ref 11.5–15.5)
WBC: 5 10*3/uL (ref 4.0–10.5)

## 2013-04-30 LAB — URINALYSIS, ROUTINE W REFLEX MICROSCOPIC
Bilirubin Urine: NEGATIVE
GLUCOSE, UA: NEGATIVE mg/dL
Ketones, ur: NEGATIVE mg/dL
Nitrite: POSITIVE — AB
PH: 6 (ref 5.0–8.0)
Protein, ur: NEGATIVE mg/dL
Specific Gravity, Urine: 1.02 (ref 1.005–1.030)
Urobilinogen, UA: 0.2 mg/dL (ref 0.0–1.0)

## 2013-04-30 LAB — BASIC METABOLIC PANEL WITH GFR
BUN: 11 mg/dL (ref 6–23)
CO2: 25 meq/L (ref 19–32)
Calcium: 10.1 mg/dL (ref 8.4–10.5)
Chloride: 101 meq/L (ref 96–112)
Creatinine, Ser: 0.76 mg/dL (ref 0.50–1.10)
GFR calc Af Amer: >90 mL/min
GFR calc non Af Amer: >90 mL/min
Glucose, Bld: 100 mg/dL — ABNORMAL HIGH (ref 70–99)
Potassium: 4.4 meq/L (ref 3.7–5.3)
Sodium: 138 meq/L (ref 137–147)

## 2013-04-30 LAB — URINE MICROSCOPIC-ADD ON

## 2013-04-30 LAB — PREGNANCY, URINE: PREG TEST UR: NEGATIVE

## 2013-04-30 MED ORDER — OXYCODONE-ACETAMINOPHEN 5-325 MG PO TABS: 1.0000 | ORAL_TABLET | Freq: Four times a day (QID) | ORAL | Status: DC | PRN

## 2013-04-30 MED ORDER — HYDROMORPHONE HCL PF 1 MG/ML IJ SOLN
1.0000 mg | Freq: Once | INTRAMUSCULAR | Status: AC
  Administered 2013-04-30: 1 mg via INTRAVENOUS
  Filled 2013-04-30: qty 1

## 2013-04-30 MED ORDER — CEFTRIAXONE SODIUM 1 G IJ SOLR
1.0000 g | Freq: Once | INTRAMUSCULAR | Status: AC
  Administered 2013-04-30: 1 g via INTRAVENOUS
  Filled 2013-04-30: qty 10

## 2013-04-30 MED ORDER — PROMETHAZINE HCL 25 MG/ML IJ SOLN
25.0000 mg | Freq: Once | INTRAMUSCULAR | Status: AC
  Administered 2013-04-30: 25 mg via INTRAVENOUS
  Filled 2013-04-30: qty 1

## 2013-04-30 MED ORDER — OXYCODONE-ACETAMINOPHEN 5-325 MG PO TABS
2.0000 | ORAL_TABLET | Freq: Once | ORAL | Status: AC
  Administered 2013-04-30: 2 via ORAL
  Filled 2013-04-30: qty 2

## 2013-04-30 MED ORDER — CEPHALEXIN 500 MG PO CAPS: 500.0000 mg | ORAL_CAPSULE | Freq: Four times a day (QID) | ORAL | Status: DC

## 2013-04-30 MED ORDER — PROMETHAZINE HCL 25 MG PO TABS: 25.0000 mg | ORAL_TABLET | Freq: Four times a day (QID) | ORAL | Status: DC | PRN

## 2013-04-30 NOTE — ED Notes (Signed)
Pt c/o pain in r flank since Friday.  Reports n/v.   Reports history of kidney stones.

## 2013-04-30 NOTE — ED Notes (Signed)
Patient given sprite per PO challenge.

## 2013-04-30 NOTE — ED Provider Notes (Signed)
CSN: [id number]     Arrival date & time 04/30/13  1455 History  This chart was scribed for Shon Baton, MD by Beverly Milch, ED Scribe. This patient was seen in room APA03/APA03 and the patient's care was started at 3:06 PM.   Chief Complaint  Patient presents with  . Flank Pain      HPI HPI Comments: [name] is a 28 y.o. female with a h/o kidney stones who presents to the Emergency Department complaining of right side pain that began 2 days ago. She reports her pain as a 10/10, shart and radiating into the groin.  Similar in feeling to her previous episodes of kidney stones. She reports urinary hesitancy, hematuria, nausea and vomiting. Pt denies any blood in vomit, dysuria. She states she took ibuprofen with no relief. Pt states her "tubes are tied."   Past Medical History  Diagnosis Date  . Ovarian cyst   . UTI (lower urinary tract infection)   . Asthma   . Kidney stones   . Calcium oxalate renal stones   . Chronic pelvic pain in female     Past Surgical History  Procedure Laterality Date  . Ovarian cyst removal    . Lithotripsy    . Laparoscopic tubal ligation    . Cholecystectomy    . Appendectomy    . Tee without cardioversion N/A 10/31/2012    Procedure: TRANSESOPHAGEAL ECHOCARDIOGRAM (TEE);  Surgeon: Laurey Morale, MD;  Location: [facility] ENDOSCOPY;  Service: Cardiovascular;  Laterality: N/A;    Family History  Problem Relation Age of Onset  . Diabetes Other   . Seizures Other   . Cancer Other     History  Substance Use Topics  . Smoking status: Current Every Day Smoker -- 0.50 packs/day for 14 years    Types: Cigarettes  . Smokeless tobacco: Never Used  . Alcohol Use: No    OB History   Grav Para Term Preterm Abortions TAB SAB Ect Mult Living   13 2 1 1 10  9 1  2       Review of Systems  Constitutional: Negative for fever.  Respiratory: Negative for cough, chest tightness and shortness of breath.   Cardiovascular: Negative for chest  pain.  Gastrointestinal: Positive for nausea and vomiting. Negative for abdominal pain and diarrhea.  Genitourinary: Positive for dysuria, hematuria and flank pain. Negative for vaginal discharge.  Musculoskeletal: Negative for back pain.  Skin: Negative for wound.  Neurological: Negative for headaches.  Psychiatric/Behavioral: Negative for confusion.  All other systems reviewed and are negative.      Allergies  Iohexol; Zofran; Ketorolac tromethamine; and Morphine and related  Home Medications   Current Outpatient Rx  Name  Route  Sig  Dispense  Refill  . acetaminophen (TYLENOL) 500 MG tablet   Oral   Take 1,500 mg by mouth daily as needed for mild pain, moderate pain or headache.         . hydrOXYzine (VISTARIL) 50 MG capsule   Oral   Take 50 mg by mouth every 12 (twelve) hours as needed for anxiety.         Marland Kitchen ibuprofen (ADVIL,MOTRIN) 800 MG tablet   Oral   Take 800 mg by mouth daily as needed for pain.         Marland Kitchen QUEtiapine (SEROQUEL) 100 MG tablet   Oral   Take 100 mg by mouth at bedtime.         . cephALEXin (KEFLEX)  500 MG capsule   Oral   Take 1 capsule (500 mg total) by mouth 4 (four) times daily.   28 capsule   0   . oxyCODONE-acetaminophen (PERCOCET/ROXICET) 5-325 MG per tablet   Oral   Take 1 tablet by mouth every 6 (six) hours as needed for severe pain.   10 tablet   0   . promethazine (PHENERGAN) 25 MG tablet   Oral   Take 1 tablet (25 mg total) by mouth every 6 (six) hours as needed for nausea or vomiting.   15 tablet   0    Triage Vitals: BP 137/112  Pulse 103  Temp(Src) 98.1 F (36.7 C) (Oral)  Ht 5\' 4"  (1.626 m)  Wt 217 lb (98.431 kg)  BMI 37.23 kg/m2  SpO2 100%  LMP 04/03/2013  Physical Exam  Nursing note and vitals reviewed. Constitutional: She is oriented to person, place, and time.  Tearful, uncomfortable appearing  HENT:  Head: Normocephalic and atraumatic.  Eyes: Pupils are equal, round, and reactive to light.   Neck: Neck supple.  Cardiovascular: Regular rhythm and normal heart sounds.   tachycardia  Pulmonary/Chest: Effort normal and breath sounds normal. No respiratory distress. She has no wheezes.  Abdominal: Soft. Bowel sounds are normal. There is no tenderness. There is no rebound and no guarding.  No CVA tenderness  Neurological: She is alert and oriented to person, place, and time.  Skin: Skin is warm and dry.  Psychiatric: She has a normal mood and affect.    ED Course  Procedures (including critical care time)  DIAGNOSTIC STUDIES: Oxygen Saturation is 100% on RA, normal by my interpretation.    COORDINATION OF CARE: 3:05 PM- Pt advised of plan for treatment and pt agrees.    Labs Review Labs Reviewed  BASIC METABOLIC PANEL - Abnormal; Notable for the following:    Glucose, Bld 100 (*)    All other components within normal limits  URINALYSIS, ROUTINE W REFLEX MICROSCOPIC - Abnormal; Notable for the following:    APPearance CLOUDY (*)    Hgb urine dipstick LARGE (*)    Nitrite POSITIVE (*)    Leukocytes, UA TRACE (*)    All other components within normal limits  URINE MICROSCOPIC-ADD ON - Abnormal; Notable for the following:    Squamous Epithelial / LPF FEW (*)    Bacteria, UA MANY (*)    All other components within normal limits  CBC WITH DIFFERENTIAL  PREGNANCY, URINE   Imaging Review Ct Abdomen Pelvis Wo Contrast  04/30/2013   CLINICAL DATA:  Bilateral flank pain  EXAM: CT ABDOMEN AND PELVIS WITHOUT CONTRAST  TECHNIQUE: Multidetector CT imaging of the abdomen and pelvis was performed following the standard protocol without intravenous contrast.  COMPARISON:  CT ABD-PELV W/O CM dated 03/22/2013  FINDINGS: The lung bases are clear.  There are bilateral punctate nephrolithiasis. There is no ureteral calculus. No obstructive uropathy. No perinephric stranding is seen. The kidneys are symmetric in size without evidence for exophytic mass. The bladder is unremarkable.  The  liver demonstrates no focal abnormality. The gallbladder is surgically absent. The spleen demonstrates no focal abnormality. The adrenal glands and pancreas are normal.  The unopacified stomach, duodenum, small intestine and large intestine are unremarkable, but evaluation is limited by lack of oral contrast. 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 There is no pneumoperitoneum, pneumatosis, or portal venous gas. There is no abdominal or pelvic free fluid. There is no lymphadenopathy.  The abdominal aorta is  normal in caliber.  The osseous structures are unremarkable.  IMPRESSION: 1. Punctate nonobstructing bilateral nephrolithiasis.   Electronically Signed   By: Elige KoHetal  Patel   On: 04/30/2013 17:40     EKG Interpretation None      MDM   Final diagnoses:  Urinary tract infection    Patient presents with flank pain. History of kidney stones which he states is similar. She is uncomfortable but nontoxic on exam. Lab work is notable for nitrite-positive urine. No leukocytosis. I reviewed the patient's chart. Last CT scan was one year ago. Concern for infected stone given flank pain. Noncontrasted CT scan was obtained and shows no obstructing stone. There is also no perinephric stranding. She's been afebrile.  Suspect simple UTI; however, given flank pain we'll treat with a longer course of antibiotics. Patient has been able to tolerate by mouth. We'll go home with pain medication, Phenergan, and Keflex. She is to followup with her primary physician.  After history, exam, and medical workup I feel the patient has been appropriately medically screened and is safe for discharge home. Pertinent diagnoses were discussed with the patient. Patient was given return precautions.  I personally performed the services described in this documentation, which was scribed in my presence. The recorded information has been reviewed and is accurate.    Shon Batonourtney F Horton, MD 04/30/13 [phone]

## 2013-04-30 NOTE — Discharge Instructions (Signed)
Urinary Tract Infection  Urinary tract infections (UTIs) can develop anywhere along your urinary tract. Your urinary tract is your body's drainage system for removing wastes and extra water. Your urinary tract includes two kidneys, two ureters, a bladder, and a urethra. Your kidneys are a pair of bean-shaped organs. Each kidney is about the size of your fist. They are located below your ribs, one on each side of your spine.  CAUSES  Infections are caused by microbes, which are microscopic organisms, including fungi, viruses, and bacteria. These organisms are so small that they can only be seen through a microscope. Bacteria are the microbes that most commonly cause UTIs.  SYMPTOMS   Symptoms of UTIs may vary by age and gender of the patient and by the location of the infection. Symptoms in young women typically include a frequent and intense urge to urinate and a painful, burning feeling in the bladder or urethra during urination. Older women and men are more likely to be tired, shaky, and weak and have muscle aches and abdominal pain. A fever may mean the infection is in your kidneys. Other symptoms of a kidney infection include pain in your back or sides below the ribs, nausea, and vomiting.  DIAGNOSIS  To diagnose a UTI, your caregiver will ask you about your symptoms. Your caregiver also will ask to provide a urine sample. The urine sample will be tested for bacteria and white blood cells. White blood cells are made by your body to help fight infection.  TREATMENT   Typically, UTIs can be treated with medication. Because most UTIs are caused by a bacterial infection, they usually can be treated with the use of antibiotics. The choice of antibiotic and length of treatment depend on your symptoms and the type of bacteria causing your infection.  HOME CARE INSTRUCTIONS   If you were prescribed antibiotics, take them exactly as your caregiver instructs you. Finish the medication even if you feel better after you  have only taken some of the medication.   Drink enough water and fluids to keep your urine clear or pale yellow.   Avoid caffeine, tea, and carbonated beverages. They tend to irritate your bladder.   Empty your bladder often. Avoid holding urine for long periods of time.   Empty your bladder before and after sexual intercourse.   After a bowel movement, women should cleanse from front to back. Use each tissue only once.  SEEK MEDICAL CARE IF:    You have back pain.   You develop a fever.   Your symptoms do not begin to resolve within 3 days.  SEEK IMMEDIATE MEDICAL CARE IF:    You have severe back pain or lower abdominal pain.   You develop chills.   You have nausea or vomiting.   You have continued burning or discomfort with urination.  MAKE SURE YOU:    Understand these instructions.   Will watch your condition.   Will get help right away if you are not doing well or get worse.  Document Released: 11/05/2004 Document Revised: 07/28/2011 Document Reviewed: 03/06/2011  [employer] Patient Information 2014 [employer].

## 2013-04-30 NOTE — ED Notes (Signed)
Patient continues to c/o pain. Dr. Wilkie AyeHorton has spoken w/her regarding d/c plans.  Respirations even and unlabored. Skin warm/dry. Discharge instructions reviewed with patient at this time. Patient given opportunity to voice concerns/ask questions. IV removed per policy and band-aid applied to site. Patient discharged at this time and left Emergency Department with steady gait.

## 2013-05-02 ENCOUNTER — Ambulatory Visit (INDEPENDENT_AMBULATORY_CARE_PROVIDER_SITE_OTHER): Payer: Medicaid Other | Admitting: Family Medicine

## 2013-05-02 ENCOUNTER — Ambulatory Visit (INDEPENDENT_AMBULATORY_CARE_PROVIDER_SITE_OTHER): Payer: Medicaid Other

## 2013-05-02 ENCOUNTER — Encounter: Payer: Self-pay | Admitting: Family Medicine

## 2013-05-02 VITALS — BP 136/103 | HR 96 | Temp 98.9°F | Ht 64.5 in | Wt 193.4 lb

## 2013-05-02 DIAGNOSIS — R109 Unspecified abdominal pain: Secondary | ICD-10-CM

## 2013-05-02 DIAGNOSIS — R1030 Lower abdominal pain, unspecified: Secondary | ICD-10-CM

## 2013-05-02 DIAGNOSIS — R300 Dysuria: Secondary | ICD-10-CM

## 2013-05-02 DIAGNOSIS — N200 Calculus of kidney: Secondary | ICD-10-CM

## 2013-05-02 DIAGNOSIS — R309 Painful micturition, unspecified: Secondary | ICD-10-CM

## 2013-05-02 LAB — POCT URINALYSIS DIPSTICK
Bilirubin, UA: NEGATIVE
Glucose, UA: NEGATIVE
Ketones, UA: NEGATIVE
Leukocytes, UA: NEGATIVE
Nitrite, UA: NEGATIVE
Spec Grav, UA: 1.025
Urobilinogen, UA: NEGATIVE
pH, UA: 6

## 2013-05-02 LAB — POCT UA - MICROSCOPIC ONLY
Casts, Ur, LPF, POC: NEGATIVE
Crystals, Ur, HPF, POC: NEGATIVE
Mucus, UA: NEGATIVE
Yeast, UA: NEGATIVE

## 2013-05-02 MED ORDER — OXYCODONE-ACETAMINOPHEN 5-325 MG PO TABS: 1.0000 | ORAL_TABLET | Freq: Four times a day (QID) | ORAL | Status: DC | PRN

## 2013-05-02 NOTE — Progress Notes (Signed)
   Subjective:    Patient ID: [name], female    DOB: Jan 14, 1986, 28 y.o.   MRN: [id number]  HPI This 28 y.o. female presents for evaluation of bilateral flank pain right worse than left.  She was seen A couple days ago in the ED and she has bilateral punctate nephrolithiasis but no obstruction and no Ureter stones.  She is in severe pain.   Review of Systems Hx of back pain and kidney stones No chest pain, SOB, HA, dizziness, vision change, N/V, diarrhea, constipation, dysuria, urinary urgency or frequency, myalgias, arthralgias or rash.     Objective:   Physical Exam  Vital signs noted  Well developed well nourished female.  HEENT - Head atraumatic Normocephalic                Eyes - PERRLA, Conjuctiva - clear Sclera- Clear EOMI                Ears - EAC's Wnl TM's Wnl Gross Hearing WNL                Nose - Nares patent                 Throat - oropharanx wnl Respiratory - Lungs CTA bilateral Cardiac - RRR S1 and S2 without murmur GI - Abdomen soft Nontender and bowel sounds active x 4 Extremities - No edema. Neuro - Grossly intact.  Results for orders placed in visit on 05/02/13  POCT UA - MICROSCOPIC ONLY      Result Value Ref Range   WBC, Ur, HPF, POC 1-5     RBC, urine, microscopic 10-15     Bacteria, U Microscopic few     Mucus, UA neg     Epithelial cells, urine per micros few     Crystals, Ur, HPF, POC neg     Casts, Ur, LPF, POC neg     Yeast, UA neg    POCT URINALYSIS DIPSTICK      Result Value Ref Range   Color, UA gold     Clarity, UA clear     Glucose, UA neg     Bilirubin, UA neg     Ketones, UA neg     Spec Grav, UA 1.025     Blood, UA large     pH, UA 6.0     Protein, UA 4+     Urobilinogen, UA negative     Nitrite, UA neg     Leukocytes, UA Negative     KUB - No stones seen Prelimnary reading by [name] NoaWilliam Howard Bunte,FNP    Assessment & Plan:  Painful urination - Plan: POCT UA - Microscopic Only, POCT urinalysis dipstick, DG Abd 1  View, oxyCODONE-acetaminophen (PERCOCET/ROXICET) 5-325 MG per tablet, Ambulatory referral to Urology  Lower abdominal pain - Plan: DG Abd 1 View, oxyCODONE-acetaminophen (PERCOCET/ROXICET) 5-325 MG per tablet, Ambulatory referral to Urology  Kidney stones - Plan: oxyCODONE-acetaminophen (PERCOCET/ROXICET) 5-325 MG per tablet, Ambulatory referral to Urology  Deatra CanterWilliam J Desiraye Rolfson FNP

## 2013-05-17 ENCOUNTER — Telehealth: Payer: Self-pay | Admitting: Family Medicine

## 2013-05-18 ENCOUNTER — Telehealth: Payer: Self-pay | Admitting: Family Medicine

## 2013-05-18 NOTE — Telephone Encounter (Signed)
Pt aware appt needed

## 2013-05-18 NOTE — Telephone Encounter (Signed)
Narcotic medicines require an appointment and recommend she follow up

## 2013-05-19 ENCOUNTER — Encounter: Payer: Self-pay | Admitting: Family Medicine

## 2013-05-19 ENCOUNTER — Ambulatory Visit (INDEPENDENT_AMBULATORY_CARE_PROVIDER_SITE_OTHER): Payer: Medicaid Other | Admitting: Family Medicine

## 2013-05-19 VITALS — BP 133/95 | HR 84 | Temp 97.5°F | Ht 64.5 in | Wt 201.0 lb

## 2013-05-19 DIAGNOSIS — R309 Painful micturition, unspecified: Secondary | ICD-10-CM

## 2013-05-19 DIAGNOSIS — R1030 Lower abdominal pain, unspecified: Secondary | ICD-10-CM

## 2013-05-19 DIAGNOSIS — N200 Calculus of kidney: Secondary | ICD-10-CM

## 2013-05-19 DIAGNOSIS — R300 Dysuria: Secondary | ICD-10-CM

## 2013-05-19 DIAGNOSIS — R109 Unspecified abdominal pain: Secondary | ICD-10-CM

## 2013-05-19 LAB — POCT URINALYSIS DIPSTICK
Bilirubin, UA: NEGATIVE
Glucose, UA: NEGATIVE
Ketones, UA: NEGATIVE
Nitrite, UA: POSITIVE
Protein, UA: NEGATIVE
Spec Grav, UA: 1.015
Urobilinogen, UA: NEGATIVE
pH, UA: 6.5

## 2013-05-19 LAB — POCT UA - MICROSCOPIC ONLY
Casts, Ur, LPF, POC: NEGATIVE
Crystals, Ur, HPF, POC: NEGATIVE
Mucus, UA: NEGATIVE
Yeast, UA: NEGATIVE

## 2013-05-19 MED ORDER — OXYCODONE-ACETAMINOPHEN 5-325 MG PO TABS: 1.0000 | ORAL_TABLET | Freq: Four times a day (QID) | ORAL | Status: DC | PRN

## 2013-05-19 MED ORDER — PROMETHAZINE HCL 25 MG PO TABS: 25.0000 mg | ORAL_TABLET | Freq: Four times a day (QID) | ORAL | Status: DC | PRN

## 2013-05-19 NOTE — Progress Notes (Signed)
   Subjective:    Patient ID: [name], female    DOB: [birth year], 28 y.o.   MRN: [id number]  HPI  This 28 y.o. female presents for evaluation of abdominal and back pain.  She has hx of kidney stones And she was recently rx'd oxycodone and phenergan for kidney stones.  She is out of pain meds and  Phenergan and is requesting refill. She has been to see her Urologist 05/08/13 and has appt. 07/06/13.   Review of Systems No chest pain, SOB, HA, dizziness, vision change, N/V, diarrhea, constipation, dysuria, urinary urgency or frequency, myalgias, arthralgias or rash.     Objective:   Physical Exam  Vital signs noted  Well developed well nourished female.  HEENT - Head atraumatic Normocephalic                Eyes - PERRLA, Conjuctiva - clear Sclera- Clear EOMI                Ears - EAC's Wnl TM's Wnl Gross Hearing WNL                Throat - oropharanx wnl Respiratory - Lungs CTA bilateral Cardiac - RRR S1 and S2 without murmur GI - Abdomen soft Nontender and bowel sounds active x 4 Extremities - No edema. Neuro - Grossly intact.      Assessment & Plan:  Abdominal pain - Plan: POCT UA - Microscopic Only, POCT urinalysis dipstick, Urine culture, promethazine (PHENERGAN) 25 MG tablet  Painful urination - Plan: promethazine (PHENERGAN) 25 MG tablet, oxyCODONE-acetaminophen (PERCOCET/ROXICET) 5-325 MG per tablet  Lower abdominal pain - Plan: promethazine (PHENERGAN) 25 MG tablet, oxyCODONE-acetaminophen (PERCOCET/ROXICET) 5-325 MG per tablet  Kidney stones - Plan: promethazine (PHENERGAN) 25 MG tablet, oxyCODONE-acetaminophen (PERCOCET/ROXICET) 5-325 MG per tablet  Urologist called and they have informed to have patient call and they will get her in next week.  Deatra CanterWilliam J Oxford FNP

## 2013-05-21 LAB — URINE CULTURE

## 2013-05-24 ENCOUNTER — Other Ambulatory Visit: Payer: Self-pay | Admitting: Family Medicine

## 2013-05-24 MED ORDER — CIPROFLOXACIN HCL 500 MG PO TABS: 500.0000 mg | ORAL_TABLET | Freq: Two times a day (BID) | ORAL | Status: DC

## 2013-05-26 NOTE — Telephone Encounter (Signed)
Unable to reach patient by phone. Encounter closed. 

## 2013-06-20 ENCOUNTER — Telehealth: Payer: Self-pay | Admitting: Family Medicine

## 2013-06-20 NOTE — Telephone Encounter (Signed)
Message copied by Doreatha MassedMOORE, MITZI on Tue Jun 20, 2013 10:25 AM ------      Message from: Deatra CanterXFORD, WILLIAM J      Created: Wed May 24, 2013 12:55 PM       UA cx shows UTI and cipro 500mg  one po bid x 10 days sent to pharmacy ------

## 2013-06-22 NOTE — Telephone Encounter (Signed)
Patient aware.

## 2013-06-30 ENCOUNTER — Ambulatory Visit: Payer: Medicaid Other | Admitting: Urology

## 2013-08-05 ENCOUNTER — Emergency Department (HOSPITAL_COMMUNITY)
Admission: EM | Admit: 2013-08-05 | Discharge: 2013-08-05 | Disposition: A | Discharge status: Home / Self Care | Payer: Medicaid Other | Attending: Emergency Medicine | Admitting: Emergency Medicine

## 2013-08-05 ENCOUNTER — Encounter (HOSPITAL_COMMUNITY): Payer: Self-pay | Admitting: Emergency Medicine

## 2013-08-05 DIAGNOSIS — Z87442 Personal history of urinary calculi: Secondary | ICD-10-CM | POA: Insufficient documentation

## 2013-08-05 DIAGNOSIS — Z87440 Personal history of urinary (tract) infections: Secondary | ICD-10-CM | POA: Insufficient documentation

## 2013-08-05 DIAGNOSIS — Z792 Long term (current) use of antibiotics: Secondary | ICD-10-CM | POA: Insufficient documentation

## 2013-08-05 DIAGNOSIS — G8929 Other chronic pain: Secondary | ICD-10-CM | POA: Insufficient documentation

## 2013-08-05 DIAGNOSIS — Z9089 Acquired absence of other organs: Secondary | ICD-10-CM | POA: Insufficient documentation

## 2013-08-05 DIAGNOSIS — J45909 Unspecified asthma, uncomplicated: Secondary | ICD-10-CM | POA: Insufficient documentation

## 2013-08-05 DIAGNOSIS — N23 Unspecified renal colic: Secondary | ICD-10-CM | POA: Insufficient documentation

## 2013-08-05 DIAGNOSIS — Z79899 Other long term (current) drug therapy: Secondary | ICD-10-CM | POA: Insufficient documentation

## 2013-08-05 DIAGNOSIS — Z9889 Other specified postprocedural states: Secondary | ICD-10-CM | POA: Insufficient documentation

## 2013-08-05 DIAGNOSIS — Z791 Long term (current) use of non-steroidal anti-inflammatories (NSAID): Secondary | ICD-10-CM | POA: Insufficient documentation

## 2013-08-05 DIAGNOSIS — Z3202 Encounter for pregnancy test, result negative: Secondary | ICD-10-CM | POA: Insufficient documentation

## 2013-08-05 DIAGNOSIS — F17200 Nicotine dependence, unspecified, uncomplicated: Secondary | ICD-10-CM | POA: Insufficient documentation

## 2013-08-05 DIAGNOSIS — Z8742 Personal history of other diseases of the female genital tract: Secondary | ICD-10-CM | POA: Insufficient documentation

## 2013-08-05 LAB — CBC WITH DIFFERENTIAL/PLATELET
BASOS ABS: 0 10*3/uL (ref 0.0–0.1)
BASOS PCT: 1 % (ref 0–1)
EOS ABS: 0.2 10*3/uL (ref 0.0–0.7)
Eosinophils Relative: 3 % (ref 0–5)
HCT: 37.9 % (ref 36.0–46.0)
Hemoglobin: 12.5 g/dL (ref 12.0–15.0)
Lymphocytes Relative: 57 % — ABNORMAL HIGH (ref 12–46)
Lymphs Abs: 3 10*3/uL (ref 0.7–4.0)
MCH: 30.4 pg (ref 26.0–34.0)
MCHC: 33 g/dL (ref 30.0–36.0)
MCV: 92.2 fL (ref 78.0–100.0)
Monocytes Absolute: 0.4 10*3/uL (ref 0.1–1.0)
Monocytes Relative: 7 % (ref 3–12)
NEUTROS PCT: 32 % — AB (ref 43–77)
Neutro Abs: 1.7 10*3/uL (ref 1.7–7.7)
PLATELETS: 339 10*3/uL (ref 150–400)
RBC: 4.11 MIL/uL (ref 3.87–5.11)
RDW: 12.6 % (ref 11.5–15.5)
WBC: 5.4 10*3/uL (ref 4.0–10.5)

## 2013-08-05 LAB — COMPREHENSIVE METABOLIC PANEL
ALK PHOS: 64 U/L (ref 39–117)
ALT: 28 U/L (ref 0–35)
AST: 28 U/L (ref 0–37)
Albumin: 3.5 g/dL (ref 3.5–5.2)
BUN: 10 mg/dL (ref 6–23)
CO2: 23 mEq/L (ref 19–32)
Calcium: 8.4 mg/dL (ref 8.4–10.5)
Chloride: 102 mEq/L (ref 96–112)
Creatinine, Ser: 0.7 mg/dL (ref 0.50–1.10)
GFR calc Af Amer: >90 mL/min (ref >90)
GFR calc non Af Amer: >90 mL/min (ref >90)
Glucose, Bld: 94 mg/dL (ref 70–99)
Potassium: 3.6 mEq/L — ABNORMAL LOW (ref 3.7–5.3)
SODIUM: 138 meq/L (ref 137–147)
TOTAL PROTEIN: 6.8 g/dL (ref 6.0–8.3)
Total Bilirubin: 0.3 mg/dL (ref 0.3–1.2)

## 2013-08-05 LAB — URINALYSIS, ROUTINE W REFLEX MICROSCOPIC
BILIRUBIN URINE: NEGATIVE
GLUCOSE, UA: NEGATIVE mg/dL
KETONES UR: NEGATIVE mg/dL
NITRITE: NEGATIVE
Protein, ur: NEGATIVE mg/dL
SPECIFIC GRAVITY, URINE: 1.01 (ref 1.005–1.030)
Urobilinogen, UA: 0.2 mg/dL (ref 0.0–1.0)
pH: 6.5 (ref 5.0–8.0)

## 2013-08-05 LAB — LIPASE, BLOOD: Lipase: 43 U/L (ref 11–59)

## 2013-08-05 LAB — URINE MICROSCOPIC-ADD ON

## 2013-08-05 LAB — PREGNANCY, URINE: Preg Test, Ur: NEGATIVE

## 2013-08-05 MED ORDER — HYDROMORPHONE HCL PF 1 MG/ML IJ SOLN
1.0000 mg | Freq: Once | INTRAMUSCULAR | Status: AC
  Administered 2013-08-05: 1 mg via INTRAVENOUS
  Filled 2013-08-05: qty 1

## 2013-08-05 MED ORDER — METOCLOPRAMIDE HCL 5 MG/ML IJ SOLN
10.0000 mg | Freq: Once | INTRAMUSCULAR | Status: AC
  Administered 2013-08-05: 10 mg via INTRAVENOUS
  Filled 2013-08-05: qty 2

## 2013-08-05 MED ORDER — HYDROMORPHONE HCL PF 1 MG/ML IJ SOLN
1.0000 mg | Freq: Once | INTRAMUSCULAR | Status: AC
Start: 2013-08-05 — End: 2013-08-05
  Administered 2013-08-05: 1 mg via INTRAVENOUS
  Filled 2013-08-05: qty 1

## 2013-08-05 MED ORDER — TAMSULOSIN HCL 0.4 MG PO CAPS: 0.4000 mg | ORAL_CAPSULE | Freq: Every day | ORAL | Status: DC

## 2013-08-05 MED ORDER — SODIUM CHLORIDE 0.9 % IV SOLN
1000.0000 mL | INTRAVENOUS | Status: DC
  Administered 2013-08-05: 1000 mL via INTRAVENOUS

## 2013-08-05 MED ORDER — HYDROMORPHONE HCL PF 2 MG/ML IJ SOLN
2.0000 mg | Freq: Once | INTRAMUSCULAR | Status: AC
Start: 2013-08-05 — End: 2013-08-05
  Administered 2013-08-05: 2 mg via INTRAVENOUS
  Filled 2013-08-05: qty 1

## 2013-08-05 MED ORDER — DIPHENHYDRAMINE HCL 50 MG/ML IJ SOLN
25.0000 mg | Freq: Once | INTRAMUSCULAR | Status: AC
  Administered 2013-08-05: 25 mg via INTRAVENOUS
  Filled 2013-08-05: qty 1

## 2013-08-05 MED ORDER — OXYCODONE-ACETAMINOPHEN 7.5-325 MG PO TABS: 1.0000 | ORAL_TABLET | ORAL | Status: DC | PRN

## 2013-08-05 MED ORDER — PROMETHAZINE HCL 25 MG/ML IJ SOLN
25.0000 mg | Freq: Once | INTRAMUSCULAR | Status: AC
  Administered 2013-08-05: 25 mg via INTRAVENOUS
  Filled 2013-08-05: qty 1

## 2013-08-05 MED ORDER — PROMETHAZINE HCL 25 MG PO TABS: 25.0000 mg | ORAL_TABLET | Freq: Four times a day (QID) | ORAL | Status: DC | PRN

## 2013-08-05 MED ORDER — SODIUM CHLORIDE 0.9 % IV SOLN
1000.0000 mL | Freq: Once | INTRAVENOUS | Status: AC
  Administered 2013-08-05: 1000 mL via INTRAVENOUS

## 2013-08-05 NOTE — ED Provider Notes (Addendum)
CSN: [id number]     Arrival date & time 08/05/13  0248 History   First MD Initiated Contact with Patient 08/05/13 0326     Chief Complaint  Patient presents with  . Back Pain  . Abdominal Pain     (Consider location/radiation/quality/duration/timing/severity/associated sxs/prior Treatment) Patient is a 28 y.o. female presenting with back pain and abdominal pain. The history is provided by the patient.  Back Pain Associated symptoms: abdominal pain   Abdominal Pain She complains of right flank pain and right lower abdominal pain for the last 3 days. Pain has been getting worse and she now rates it at 10/10. It is a dull, achy pain not affected by movement. It is worse when she urinates. She notices pressure when she urinates but no dysuria. She denies fever or chills. There has been associated nausea and vomiting. Nothing makes the pain any better. She's tried soaking in the tub with no relief.  Past Medical History  Diagnosis Date  . Ovarian cyst   . UTI (lower urinary tract infection)   . Asthma   . Kidney stones   . Calcium oxalate renal stones   . Chronic pelvic pain in female    Past Surgical History  Procedure Laterality Date  . Ovarian cyst removal    . Lithotripsy    . Laparoscopic tubal ligation    . Cholecystectomy    . Appendectomy    . Tee without cardioversion N/A 10/31/2012    Procedure: TRANSESOPHAGEAL ECHOCARDIOGRAM (TEE);  Surgeon: Laurey Moralealton S McLean, MD;  [facility];  Service: Cardiovascular;  Laterality: N/A;   Family History  Problem Relation Age of Onset  . Diabetes Other   . Seizures Other   . Cancer Other    History  Substance Use Topics  . Smoking status: Current Every Day Smoker -- 0.50 packs/day for 14 years    Types: Cigarettes  . Smokeless tobacco: Never Used  . Alcohol Use: No   OB History   Grav Para Term Preterm Abortions TAB SAB Ect Mult Living   13 2 1 1 10  9 1  2      Review of Systems  Gastrointestinal: Positive for  abdominal pain.  Musculoskeletal: Positive for back pain.  All other systems reviewed and are negative.     Allergies  Iohexol; Zofran; Ketorolac tromethamine; and Morphine and related  Home Medications   Prior to Admission medications   Medication Sig Start Date End Date Taking? Authorizing Provider  acetaminophen (TYLENOL) 500 MG tablet Take 1,500 mg by mouth daily as needed for mild pain, moderate pain or headache.   Yes Historical Provider, MD  ibuprofen (ADVIL,MOTRIN) 800 MG tablet Take 800 mg by mouth daily as needed for pain. 11/14/11  Yes Kathie DikeHobson M Bryant, PA-C  ciprofloxacin (CIPRO) 500 MG tablet Take 1 tablet (500 mg total) by mouth 2 (two) times daily. 05/24/13   Deatra CanterWilliam J Oxford, FNP  hydrOXYzine (VISTARIL) 50 MG capsule Take 50 mg by mouth every 12 (twelve) hours as needed for anxiety.    Historical Provider, MD  oxyCODONE-acetaminophen (PERCOCET/ROXICET) 5-325 MG per tablet Take 1 tablet by mouth every 6 (six) hours as needed for severe pain. 05/19/13   Deatra CanterWilliam J Oxford, FNP  promethazine (PHENERGAN) 25 MG tablet Take 1 tablet (25 mg total) by mouth every 6 (six) hours as needed for nausea or vomiting. 05/19/13   Deatra CanterWilliam J Oxford, FNP  QUEtiapine (SEROQUEL) 100 MG tablet Take 100 mg by mouth at bedtime.  Historical Provider, MD   BP 152/111  Pulse 97  Temp(Src) 98.5 F (36.9 C) (Oral)  Resp 24  Ht 5\' 4"  (1.626 m)  Wt 217 lb (98.431 kg)  BMI 37.23 kg/m2  SpO2 100%  LMP 07/29/2013 Physical Exam  Nursing note and vitals reviewed.  28 year old female, who appears to be in pain, but is in no acute distress. Vital signs are significant for tachypnea with respiratory rate of 24, and hypertension with blood pressure 152/111. Oxygen saturation is 100%, which is normal. Head is normocephalic and atraumatic. PERRLA, EOMI. Oropharynx is clear. Neck is nontender and supple without adenopathy or JVD. Back is nontender in the midline. There is moderate right CVA tenderness. Lungs  are clear without rales, wheezes, or rhonchi. Chest is nontender. Heart has regular rate and rhythm without murmur. Abdomen is soft, flat, with moderate tenderness in the right lower quadrant. There is no rebound or guarding. There are no masses or hepatosplenomegaly and peristalsis is hypoactive. Extremities have no cyanosis or edema, full range of motion is present. Skin is warm and dry without rash. Neurologic: Mental status is normal, cranial nerves are intact, there are no motor or sensory deficits.  ED Course  Procedures (including critical care time) Labs Review Results for orders placed during the hospital encounter of 08/05/13  URINALYSIS, ROUTINE W REFLEX MICROSCOPIC      Result Value Ref Range   Color, Urine YELLOW  YELLOW   APPearance HAZY (*) CLEAR   Specific Gravity, Urine 1.010  1.005 - 1.030   pH 6.5  5.0 - 8.0   Glucose, UA NEGATIVE  NEGATIVE mg/dL   Hgb urine dipstick LARGE (*) NEGATIVE   Bilirubin Urine NEGATIVE  NEGATIVE   Ketones, ur NEGATIVE  NEGATIVE mg/dL   Protein, ur NEGATIVE  NEGATIVE mg/dL   Urobilinogen, UA 0.2  0.0 - 1.0 mg/dL   Nitrite NEGATIVE  NEGATIVE   Leukocytes, UA TRACE (*) NEGATIVE  CBC WITH DIFFERENTIAL      Result Value Ref Range   WBC 5.4  4.0 - 10.5 K/uL   RBC 4.11  3.87 - 5.11 MIL/uL   Hemoglobin 12.5  12.0 - 15.0 g/dL   HCT 04.537.9  40.936.0 - 81.146.0 %   MCV 92.2  78.0 - 100.0 fL   MCH 30.4  26.0 - 34.0 pg   MCHC 33.0  30.0 - 36.0 g/dL   RDW 91.412.6  78.211.5 - 95.615.5 %   Platelets 339  150 - 400 K/uL   Neutrophils Relative % 32 (*) 43 - 77 %   Neutro Abs 1.7  1.7 - 7.7 K/uL   Lymphocytes Relative 57 (*) 12 - 46 %   Lymphs Abs 3.0  0.7 - 4.0 K/uL   Monocytes Relative 7  3 - 12 %   Monocytes Absolute 0.4  0.1 - 1.0 K/uL   Eosinophils Relative 3  0 - 5 %   Eosinophils Absolute 0.2  0.0 - 0.7 K/uL   Basophils Relative 1  0 - 1 %   Basophils Absolute 0.0  0.0 - 0.1 K/uL  COMPREHENSIVE METABOLIC PANEL      Result Value Ref Range   Sodium 138  137  - 147 mEq/L   Potassium 3.6 (*) 3.7 - 5.3 mEq/L   Chloride 102  96 - 112 mEq/L   CO2 23  19 - 32 mEq/L   Glucose, Bld 94  70 - 99 mg/dL   BUN 10  6 - 23 mg/dL  Creatinine, Ser 0.70  0.50 - 1.10 mg/dL   Calcium 8.4  8.4 - 16.1 mg/dL   Total Protein 6.8  6.0 - 8.3 g/dL   Albumin 3.5  3.5 - 5.2 g/dL   AST 28  0 - 37 U/L   ALT 28  0 - 35 U/L   Alkaline Phosphatase 64  39 - 117 U/L   Total Bilirubin 0.3  0.3 - 1.2 mg/dL   GFR calc non Af Amer >90  >90 mL/min   GFR calc Af Amer >90  >90 mL/min  LIPASE, BLOOD      Result Value Ref Range   Lipase 43  11 - 59 U/L  URINE MICROSCOPIC-ADD ON      Result Value Ref Range   Squamous Epithelial / LPF RARE  RARE   WBC, UA 0-2  <3 WBC/hpf   RBC / HPF TOO NUMEROUS TO COUNT  <3 RBC/hpf   Bacteria, UA MANY (*) RARE  PREGNANCY, URINE      Result Value Ref Range   Preg Test, Ur NEGATIVE  NEGATIVE   MDM   Final diagnoses:  Ureteral colic    Flank pain and lower abdominal pain suspicious for either urinary tract infection or urolithiasis. Old records are reviewed and she has been diagnosed with pyelonephritis and kidney stones in the past. Last CT scan was done 3 months ago and did show punctate stones in both kidneys. She will be given IV hydration, IV metoclopramide, and IV hydromorphone. She has had numerous (24) CT scans of the abdomen and pelvis, so I will try to make a diagnosis without additional CT scan.  She has achieved very little relief of pain with above noted treatment. She's given multiple doses of hydromorphone and is still not getting adequate relief. She had persistent nausea so was given promethazine which is also not adequately controlling her nausea. Urinalysis has too numerous to count RBCs which would be consistent with urolithiasis. Ultrasound has been ordered. Case is signed out to Dr. Deretha Emory.  Dione Booze, MD 08/05/13 0710  Patient stated that she was unable to stay in the ED because of lack of childcare. She was  discharged with prescriptions for oxycodone-acetaminophen, and naproxen, and tamsulosin. She is advised to return if she is not getting adequate relief of pain at home.  Dione Booze, MD 08/05/13 [phone]

## 2013-08-05 NOTE — ED Notes (Signed)
Pt co pain and nausea, EDP aware.

## 2013-08-05 NOTE — ED Notes (Signed)
My back and my stomach is hurting, I have been vomiting per pt. Have not seen a doctor.

## 2013-08-05 NOTE — Discharge Instructions (Signed)
Kidney Stones °Kidney stones (urolithiasis) are deposits that form inside your kidneys. The intense pain is caused by the stone moving through the urinary tract. When the stone moves, the ureter goes into spasm around the stone. The stone is usually passed in the urine.  °CAUSES  °· A disorder that makes certain neck glands produce too much parathyroid hormone (primary hyperparathyroidism). °· A buildup of uric acid crystals, similar to gout in your joints. °· Narrowing (stricture) of the ureter. °· A kidney obstruction present at birth (congenital obstruction). °· Previous surgery on the kidney or ureters. °· Numerous kidney infections. °SYMPTOMS  °· Feeling sick to your stomach (nauseous). °· Throwing up (vomiting). °· Blood in the urine (hematuria). °· Pain that usually spreads (radiates) to the groin. °· Frequency or urgency of urination. °DIAGNOSIS  °· Taking a history and physical exam. °· Blood or urine tests. °· CT scan. °· Occasionally, an examination of the inside of the urinary bladder (cystoscopy) is performed. °TREATMENT  °· Observation. °· Increasing your fluid intake. °· Extracorporeal shock wave lithotripsy--This is a noninvasive procedure that uses shock waves to break up kidney stones. °· Surgery may be needed if you have severe pain or persistent obstruction. There are various surgical procedures. Most of the procedures are performed with the use of small instruments. Only small incisions are needed to accommodate these instruments, so recovery time is minimized. °The size, location, and chemical composition are all important variables that will determine the proper choice of action for you. Talk to your health care provider to better understand your situation so that you will minimize the risk of injury to yourself and your kidney.  °HOME CARE INSTRUCTIONS  °· Drink enough water and fluids to keep your urine clear or pale yellow. This will help you to pass the stone or stone fragments. °· Strain  all urine through the provided strainer. Keep all particulate matter and stones for your health care provider to see. The stone causing the pain may be as small as a grain of salt. It is very important to use the strainer each and every time you pass your urine. The collection of your stone will allow your health care provider to analyze it and verify that a stone has actually passed. The stone analysis will often identify what you can do to reduce the incidence of recurrences. °· Only take over-the-counter or prescription medicines for pain, discomfort, or fever as directed by your health care provider. °· Make a follow-up appointment with your health care provider as directed. °· Get follow-up X-rays if required. The absence of pain does not always mean that the stone has passed. It may have only stopped moving. If the urine remains completely obstructed, it can cause loss of kidney function or even complete destruction of the kidney. It is your responsibility to make sure X-rays and follow-ups are completed. Ultrasounds of the kidney can show blockages and the status of the kidney. Ultrasounds are not associated with any radiation and can be performed easily in a matter of minutes. °SEEK MEDICAL CARE IF: °· You experience pain that is progressive and unresponsive to any pain medicine you have been prescribed. °SEEK IMMEDIATE MEDICAL CARE IF:  °· Pain cannot be controlled with the prescribed medicine. °· You have a fever or shaking chills. °· The severity or intensity of pain increases over 18 hours and is not relieved by pain medicine. °· You develop a new onset of abdominal pain. °· You feel faint or pass out. °·   You are unable to urinate. MAKE SURE YOU:   Understand these instructions.  Will watch your condition.  Will get help right away if you are not doing well or get worse. Document Released: 01/26/2005 Document Revised: 09/28/2012 Document Reviewed: 06/29/2012 [facility] Patient Information 2015  [place], [place]. This information is not intended to replace advice given to you by your health care provider. Make sure you discuss any questions you have with your health care provider.  Tamsulosin capsules What is this medicine? TAMSULOSIN (tam SOO loe sin) is used to treat enlargement of the prostate gland in men, a condition called benign prostatic hyperplasia or BPH. It is not for use in women. It works by relaxing muscles in the prostate and bladder neck. This improves urine flow and reduces BPH symptoms. This medicine may be used for other purposes; ask your health care provider or pharmacist if you have questions. COMMON BRAND NAME(S): Flomax What should I tell my health care provider before I take this medicine? They need to know if you have any of the following conditions: -advanced kidney disease -advanced liver disease -low blood pressure -prostate cancer -an unusual or allergic reaction to tamsulosin, sulfa drugs, other medicines, foods, dyes, or preservatives -pregnant or trying to get pregnant -breast-feeding How should I use this medicine? Take this medicine by mouth about 30 minutes after the same meal every day. Follow the directions on the prescription label. Swallow the capsules whole with a glass of water. Do not crush, chew, or open capsules. Do not take your medicine more often than directed. Do not stop taking your medicine unless your doctor tells you to. Talk to your pediatrician regarding the use of this medicine in children. Special care may be needed. Overdosage: If you think you have taken too much of this medicine contact a poison control center or emergency room at once. NOTE: This medicine is only for you. Do not share this medicine with others. What if I miss a dose? If you miss a dose, take it as soon as you can. If it is almost time for your next dose, take only that dose. Do not take double or extra doses. If you stop taking your medicine for several days or  more, ask your doctor or health care professional what dose you should start back on. What may interact with this medicine? -cimetidine -fluoxetine -ketoconazole -medicines for erectile disfunction like sildenafil, tadalafil, vardenafil -medicines for high blood pressure -other alpha-blockers like alfuzosin, doxazosin, phentolamine, phenoxybenzamine, prazosin, terazosin -warfarin This list may not describe all possible interactions. Give your health care provider a list of all the medicines, herbs, non-prescription drugs, or dietary supplements you use. Also tell them if you smoke, drink alcohol, or use illegal drugs. Some items may interact with your medicine. What should I watch for while using this medicine? Visit your doctor or health care professional for regular check ups. You will need lab work done before you start this medicine and regularly while you are taking it. Check your blood pressure as directed. Ask your health care professional what your blood pressure should be, and when you should contact him or her. This medicine may make you feel dizzy or lightheaded. This is more likely to happen after the first dose, after an increase in dose, or during hot weather or exercise. Drinking alcohol and taking some medicines can make this worse. Do not drive, use machinery, or do anything that needs mental alertness until you know how this medicine affects you. Do not  sit or stand up quickly. If you begin to feel dizzy, sit down until you feel better. These effects can decrease once your body adjusts to the medicine. Contact your doctor or health care professional right away if you have an erection that lasts longer than 4 hours or if it becomes painful. This may be a sign of a serious problem and must be treated right away to prevent permanent damage. If you are thinking of having cataract surgery, tell your eye surgeon that you have taken this medicine. What side effects may I notice from  receiving this medicine? Side effects that you should report to your doctor or health care professional as soon as possible: -allergic reactions like skin rash or itching, hives, swelling of the lips, mouth, tongue, or throat -breathing problems -change in vision -feeling faint or lightheaded -irregular heartbeat -prolonged or painful erection -weakness Side effects that usually do not require medical attention (report to your doctor or health care professional if they continue or are bothersome): -back pain -change in sex drive or performance -constipation, nausea or vomiting -cough -drowsy -runny or stuffy nose -trouble sleeping This list may not describe all possible side effects. Call your doctor for medical advice about side effects. You may report side effects to FDA at 1-800-FDA-1088. Where should I keep my medicine? Keep out of the reach of children. Store at room temperature between 15 and 30 degrees C (59 and 86 degrees F). Throw away any unused medicine after the expiration date. NOTE: This sheet is a summary. It may not cover all possible information. If you have questions about this medicine, talk to your doctor, pharmacist, or health care provider.  2015, [employer]. (2012-01-27 14:11:34)  Promethazine tablets What is this medicine? PROMETHAZINE (proe METH a zeen) is an antihistamine. It is used to treat allergic reactions and to treat or prevent nausea and vomiting from illness or motion sickness. It is also used to make you sleep before surgery, and to help treat pain or nausea after surgery. This medicine may be used for other purposes; ask your health care provider or pharmacist if you have questions. COMMON BRAND NAME(S): Phenergan What should I tell my health care provider before I take this medicine? They need to know if you have any of these conditions: -glaucoma -high blood pressure or heart disease -kidney disease -liver disease -lung or  breathing disease, like asthma -prostate trouble -pain or difficulty passing urine -seizures -an unusual or allergic reaction to promethazine or phenothiazines, other medicines, foods, dyes, or preservatives -pregnant or trying to get pregnant -breast-feeding How should I use this medicine? Take this medicine by mouth with a glass of water. Follow the directions on the prescription label. Take your doses at regular intervals. Do not take your medicine more often than directed. Talk to your pediatrician regarding the use of this medicine in children. Special care may be needed. This medicine should not be given to infants and children younger than 30 years old. Overdosage: If you think you have taken too much of this medicine contact a poison control center or emergency room at once. NOTE: This medicine is only for you. Do not share this medicine with others. What if I miss a dose? If you miss a dose, take it as soon as you can. If it is almost time for your next dose, take only that dose. Do not take double or extra doses. What may interact with this medicine? Do not take this medicine with any of the  following medications: -cisapride -dofetilide -dronedarone -MAOIs like Carbex, Eldepryl, Marplan, Nardil, Parnate -pimozide -quinidine, including dextromethorphan; quinidine -thioridazine -ziprasidone This medicine may also interact with the following medications: -certain medicines for depression, anxiety, or psychotic disturbances -certain medicines for anxiety or sleep -certain medicines for seizures like carbamazepine, phenobarbital, phenytoin -certain medicines for movement abnormalities as in Parkinson's disease, or for gastrointestinal problems -epinephrine -medicines for allergies or colds -muscle relaxants -narcotic medicines for pain -other medicines that prolong the QT interval (cause an abnormal heart rhythm) -tramadol -trimethobenzamide This list may not describe all  possible interactions. Give your health care provider a list of all the medicines, herbs, non-prescription drugs, or dietary supplements you use. Also tell them if you smoke, drink alcohol, or use illegal drugs. Some items may interact with your medicine. What should I watch for while using this medicine? Tell your doctor or health care professional if your symptoms do not start to get better in 1 to 2 days. You may get drowsy or dizzy. Do not drive, use machinery, or do anything that needs mental alertness until you know how this medicine affects you. To reduce the risk of dizzy or fainting spells, do not stand or sit up quickly, especially if you are an older patient. Alcohol may increase dizziness and drowsiness. Avoid alcoholic drinks. Your mouth may get dry. Chewing sugarless gum or sucking hard candy, and drinking plenty of water may help. Contact your doctor if the problem does not go away or is severe. This medicine may cause dry eyes and blurred vision. If you wear contact lenses you may feel some discomfort. Lubricating drops may help. See your eye doctor if the problem does not go away or is severe. This medicine can make you more sensitive to the sun. Keep out of the sun. If you cannot avoid being in the sun, wear protective clothing and use sunscreen. Do not use sun lamps or tanning beds/booths. If you are diabetic, check your blood-sugar levels regularly. What side effects may I notice from receiving this medicine? Side effects that you should report to your doctor or health care professional as soon as possible: -blurred vision -irregular heartbeat, palpitations or chest pain -muscle or facial twitches -pain or difficulty passing urine -seizures -skin rash -slowed or shallow breathing -unusual bleeding or bruising -yellowing of the eyes or skin Side effects that usually do not require medical attention (report to your doctor or health care professional if they continue or are  bothersome): -headache -nightmares, agitation, nervousness, excitability, not able to sleep (these are more likely in children) -stuffy nose This list may not describe all possible side effects. Call your doctor for medical advice about side effects. You may report side effects to FDA at 1-800-FDA-1088. Where should I keep my medicine? Keep out of the reach of children. Store at room temperature, between 20 and 25 degrees C (68 and 77 degrees F). Protect from light. Throw away any unused medicine after the expiration date. NOTE: This sheet is a summary. It may not cover all possible information. If you have questions about this medicine, talk to your doctor, pharmacist, or health care provider.  2015, [employer]. (2012-09-27 15:04:46)  Acetaminophen; Oxycodone tablets What is this medicine? ACETAMINOPHEN; OXYCODONE (a set a MEE noe fen; ox i KOE done) is a pain reliever. It is used to treat mild to moderate pain. This medicine may be used for other purposes; ask your health care provider or pharmacist if you have questions. COMMON BRAND NAME(S): Endocet, Magnacet,  Narvox, Percocet, Perloxx, Primalev, Primlev, Roxicet, Xolox What should I tell my health care provider before I take this medicine? They need to know if you have any of these conditions: -brain tumor -Crohn's disease, inflammatory bowel disease, or ulcerative colitis -drug abuse or addiction -head injury -heart or circulation problems -if you often drink alcohol -kidney disease or problems going to the bathroom -liver disease -lung disease, asthma, or breathing problems -an unusual or allergic reaction to acetaminophen, oxycodone, other opioid analgesics, other medicines, foods, dyes, or preservatives -pregnant or trying to get pregnant -breast-feeding How should I use this medicine? Take this medicine by mouth with a full glass of water. Follow the directions on the prescription label. Take your medicine at  regular intervals. Do not take your medicine more often than directed. Talk to your pediatrician regarding the use of this medicine in children. Special care may be needed. Patients over 37 years old may have a stronger reaction and need a smaller dose. Overdosage: If you think you have taken too much of this medicine contact a poison control center or emergency room at once. NOTE: This medicine is only for you. Do not share this medicine with others. What if I miss a dose? If you miss a dose, take it as soon as you can. If it is almost time for your next dose, take only that dose. Do not take double or extra doses. What may interact with this medicine? -alcohol -antihistamines -barbiturates like amobarbital, butalbital, butabarbital, methohexital, pentobarbital, phenobarbital, thiopental, and secobarbital -benztropine -drugs for bladder problems like solifenacin, trospium, oxybutynin, tolterodine, hyoscyamine, and methscopolamine -drugs for breathing problems like ipratropium and tiotropium -drugs for certain stomach or intestine problems like propantheline, homatropine methylbromide, glycopyrrolate, atropine, belladonna, and dicyclomine -general anesthetics like etomidate, ketamine, nitrous oxide, propofol, desflurane, enflurane, halothane, isoflurane, and sevoflurane -medicines for depression, anxiety, or psychotic disturbances -medicines for sleep -muscle relaxants -naltrexone -narcotic medicines (opiates) for pain -phenothiazines like perphenazine, thioridazine, chlorpromazine, mesoridazine, fluphenazine, prochlorperazine, promazine, and trifluoperazine -scopolamine -tramadol -trihexyphenidyl This list may not describe all possible interactions. Give your health care provider a list of all the medicines, herbs, non-prescription drugs, or dietary supplements you use. Also tell them if you smoke, drink alcohol, or use illegal drugs. Some items may interact with your medicine. What should  I watch for while using this medicine? Tell your doctor or health care professional if your pain does not go away, if it gets worse, or if you have new or a different type of pain. You may develop tolerance to the medicine. Tolerance means that you will need a higher dose of the medication for pain relief. Tolerance is normal and is expected if you take this medicine for a long time. Do not suddenly stop taking your medicine because you may develop a severe reaction. Your body becomes used to the medicine. This does NOT mean you are addicted. Addiction is a behavior related to getting and using a drug for a non-medical reason. If you have pain, you have a medical reason to take pain medicine. Your doctor will tell you how much medicine to take. If your doctor wants you to stop the medicine, the dose will be slowly lowered over time to avoid any side effects. You may get drowsy or dizzy. Do not drive, use machinery, or do anything that needs mental alertness until you know how this medicine affects you. Do not stand or sit up quickly, especially if you are an older patient. This reduces the risk of dizzy  or fainting spells. Alcohol may interfere with the effect of this medicine. Avoid alcoholic drinks. There are different types of narcotic medicines (opiates) for pain. If you take more than one type at the same time, you may have more side effects. Give your health care provider a list of all medicines you use. Your doctor will tell you how much medicine to take. Do not take more medicine than directed. Call emergency for help if you have problems breathing. The medicine will cause constipation. Try to have a bowel movement at least every 2 to 3 days. If you do not have a bowel movement for 3 days, call your doctor or health care professional. Do not take Tylenol (acetaminophen) or medicines that have acetaminophen with this medicine. Too much acetaminophen can be very dangerous. Many nonprescription medicines  contain acetaminophen. Always read the labels carefully to avoid taking more acetaminophen. What side effects may I notice from receiving this medicine? Side effects that you should report to your doctor or health care professional as soon as possible: -allergic reactions like skin rash, itching or hives, swelling of the face, lips, or tongue -breathing difficulties, wheezing -confusion -light headedness or fainting spells -severe stomach pain -unusually weak or tired -yellowing of the skin or the whites of the eyes Side effects that usually do not require medical attention (report to your doctor or health care professional if they continue or are bothersome): -dizziness -drowsiness -nausea -vomiting This list may not describe all possible side effects. Call your doctor for medical advice about side effects. You may report side effects to FDA at 1-800-FDA-1088. Where should I keep my medicine? Keep out of the reach of children. This medicine can be abused. Keep your medicine in a safe place to protect it from theft. Do not share this medicine with anyone. Selling or giving away this medicine is dangerous and against the law. Store at room temperature between 20 and 25 degrees C (68 and 77 degrees F). Keep container tightly closed. Protect from light. This medicine may cause accidental overdose and death if it is taken by other adults, children, or pets. Flush any unused medicine down the toilet to reduce the chance of harm. Do not use the medicine after the expiration date. NOTE: This sheet is a summary. It may not cover all possible information. If you have questions about this medicine, talk to your doctor, pharmacist, or health care provider.  2015, [employer]. (2012-09-19 13:17:35)

## 2013-08-05 NOTE — ED Notes (Signed)
Pt alert & oriented x4, stable gait. Patient given discharge instructions, paperwork & prescription(s). Patient  instructed to stop at the registration desk to finish any additional paperwork. Patient verbalized understanding. Pt left department w/ no further questions. 

## 2013-08-05 NOTE — ED Notes (Signed)
Pt states she cannot stay, has no child care at 0830, EDP aware, will discharge with instructions to return if symptoms become worse.

## 2013-08-06 ENCOUNTER — Emergency Department (HOSPITAL_COMMUNITY)
Admission: EM | Admit: 2013-08-06 | Discharge: 2013-08-07 | Disposition: A | Discharge status: Home / Self Care | Payer: Medicaid Other | Attending: Emergency Medicine | Admitting: Emergency Medicine

## 2013-08-06 ENCOUNTER — Encounter (HOSPITAL_COMMUNITY): Payer: Self-pay | Admitting: Emergency Medicine

## 2013-08-06 DIAGNOSIS — Z79899 Other long term (current) drug therapy: Secondary | ICD-10-CM | POA: Insufficient documentation

## 2013-08-06 DIAGNOSIS — F17200 Nicotine dependence, unspecified, uncomplicated: Secondary | ICD-10-CM | POA: Insufficient documentation

## 2013-08-06 DIAGNOSIS — N390 Urinary tract infection, site not specified: Secondary | ICD-10-CM | POA: Insufficient documentation

## 2013-08-06 DIAGNOSIS — J45909 Unspecified asthma, uncomplicated: Secondary | ICD-10-CM | POA: Insufficient documentation

## 2013-08-06 DIAGNOSIS — Z9889 Other specified postprocedural states: Secondary | ICD-10-CM | POA: Insufficient documentation

## 2013-08-06 DIAGNOSIS — Z9089 Acquired absence of other organs: Secondary | ICD-10-CM | POA: Insufficient documentation

## 2013-08-06 DIAGNOSIS — Z8742 Personal history of other diseases of the female genital tract: Secondary | ICD-10-CM | POA: Insufficient documentation

## 2013-08-06 DIAGNOSIS — Z791 Long term (current) use of non-steroidal anti-inflammatories (NSAID): Secondary | ICD-10-CM | POA: Insufficient documentation

## 2013-08-06 DIAGNOSIS — Z3202 Encounter for pregnancy test, result negative: Secondary | ICD-10-CM | POA: Insufficient documentation

## 2013-08-06 DIAGNOSIS — G8929 Other chronic pain: Secondary | ICD-10-CM | POA: Insufficient documentation

## 2013-08-06 DIAGNOSIS — Z87442 Personal history of urinary calculi: Secondary | ICD-10-CM | POA: Insufficient documentation

## 2013-08-06 DIAGNOSIS — Z792 Long term (current) use of antibiotics: Secondary | ICD-10-CM | POA: Insufficient documentation

## 2013-08-06 DIAGNOSIS — R109 Unspecified abdominal pain: Secondary | ICD-10-CM

## 2013-08-06 LAB — URINALYSIS, ROUTINE W REFLEX MICROSCOPIC
Bilirubin Urine: NEGATIVE
Glucose, UA: NEGATIVE mg/dL
Ketones, ur: NEGATIVE mg/dL
Nitrite: POSITIVE — AB
Protein, ur: NEGATIVE mg/dL
SPECIFIC GRAVITY, URINE: 1.015 (ref 1.005–1.030)
UROBILINOGEN UA: 0.2 mg/dL (ref 0.0–1.0)
pH: 7 (ref 5.0–8.0)

## 2013-08-06 LAB — URINE MICROSCOPIC-ADD ON

## 2013-08-06 LAB — PREGNANCY, URINE: PREG TEST UR: NEGATIVE

## 2013-08-06 MED ORDER — PROMETHAZINE HCL 25 MG/ML IJ SOLN
25.0000 mg | Freq: Once | INTRAMUSCULAR | Status: AC
  Administered 2013-08-06: 25 mg via INTRAMUSCULAR
  Filled 2013-08-06: qty 1

## 2013-08-06 MED ORDER — SODIUM CHLORIDE 0.9 % IV SOLN: 1000.0000 mL | Freq: Once | INTRAVENOUS | Status: DC

## 2013-08-06 MED ORDER — PROMETHAZINE HCL 25 MG/ML IJ SOLN: 25.0000 mg | Freq: Once | INTRAMUSCULAR | Status: DC

## 2013-08-06 MED ORDER — HYDROMORPHONE HCL PF 2 MG/ML IJ SOLN
2.0000 mg | Freq: Once | INTRAMUSCULAR | Status: AC
  Administered 2013-08-06: 2 mg via INTRAMUSCULAR
  Filled 2013-08-06: qty 1

## 2013-08-06 MED ORDER — HYDROMORPHONE HCL PF 2 MG/ML IJ SOLN: 2.0000 mg | Freq: Once | INTRAMUSCULAR | Status: DC

## 2013-08-06 MED ORDER — SODIUM CHLORIDE 0.9 % IV SOLN: 1000.0000 mL | INTRAVENOUS | Status: DC

## 2013-08-06 MED ORDER — DEXTROSE 5 % IV SOLN: 1.0000 g | Freq: Once | INTRAVENOUS | Status: DC

## 2013-08-06 MED ORDER — CEFTRIAXONE SODIUM 1 G IJ SOLR
1.0000 g | Freq: Once | INTRAMUSCULAR | Status: AC
  Administered 2013-08-06: 1 g via INTRAMUSCULAR
  Filled 2013-08-06: qty 10

## 2013-08-06 MED ORDER — STERILE WATER FOR INJECTION IJ SOLN
INTRAMUSCULAR | Status: AC
  Administered 2013-08-06: 2.9 mL
  Filled 2013-08-06: qty 10

## 2013-08-06 NOTE — ED Notes (Addendum)
Pt states her adominal pain continues. Was seen in ed on the 27th for kidneys stones. Pt states oxycodone not working for pain.

## 2013-08-06 NOTE — ED Provider Notes (Signed)
CSN: [id number]     Arrival date & time 08/06/13  2251 History  This chart was scribed for Dione Boozeavid Glick, MD, by Yevette EdwardsAngela Bracken, ED Scribe. This patient was seen in room APA01/APA01 and the patient's care was started at 11:31 PM.  First MD Initiated Contact with Patient 08/06/13 2327     Chief Complaint  Patient presents with  . Abdominal Pain    The history is provided by the patient. No language interpreter was used.   HPI Comments: [name] is a 28 y.o. Female, with a h/o renal calculi and UTI, who presents to the Emergency Department complaining of persistent RLQ abdominal pain which she rates as 9/10 and has been associated with right-sided flank pain. The pt reports ambulation and urination increase the pain. Nothing mitigates the pain. She denies fever, chills, or diaphoresis. The pt was treated in the ED two days ago for the same symptoms. She reports she has been straining her urine and has visualized "gritty" residue.  She has been taking Flomax as prescribed and states the Flomax improves pressure during micturition.   Past Medical History  Diagnosis Date  . Ovarian cyst   . UTI (lower urinary tract infection)   . Asthma   . Kidney stones   . Calcium oxalate renal stones   . Chronic pelvic pain in female    Past Surgical History  Procedure Laterality Date  . Ovarian cyst removal    . Lithotripsy    . Laparoscopic tubal ligation    . Cholecystectomy    . Appendectomy    . Tee without cardioversion N/A 10/31/2012    Procedure: TRANSESOPHAGEAL ECHOCARDIOGRAM (TEE);  Surgeon: Laurey Moralealton S McLean, MD;  [facility];  Service: Cardiovascular;  Laterality: N/A;   Family History  Problem Relation Age of Onset  . Diabetes Other   . Seizures Other   . Cancer Other    History  Substance Use Topics  . Smoking status: Current Every Day Smoker -- 0.50 packs/day for 14 years    Types: Cigarettes  . Smokeless tobacco: Never Used  . Alcohol Use: No   OB History   Grav Para Term Preterm Abortions TAB SAB Ect Mult Living   13 2 1 1 10  9 1  2      Review of Systems  Constitutional: Negative for chills.  Gastrointestinal: Positive for abdominal pain. Negative for nausea and vomiting.  Genitourinary: Positive for dysuria and flank pain.  All other systems reviewed and are negative.   Allergies  Iohexol; Zofran; Ketorolac tromethamine; and Morphine and related  Home Medications   Prior to Admission medications   Medication Sig Start Date End Date Taking? Authorizing Yama Nielson  acetaminophen (TYLENOL) 500 MG tablet Take 1,500 mg by mouth daily as needed for mild pain, moderate pain or headache.    Historical Clorissa Gruenberg, MD  ciprofloxacin (CIPRO) 500 MG tablet Take 1 tablet (500 mg total) by mouth 2 (two) times daily. 05/24/13   Deatra CanterWilliam J Oxford, FNP  hydrOXYzine (VISTARIL) 50 MG capsule Take 50 mg by mouth every 12 (twelve) hours as needed for anxiety.    Historical Areg Bialas, MD  ibuprofen (ADVIL,MOTRIN) 800 MG tablet Take 800 mg by mouth daily as needed for pain. 11/14/11   Kathie DikeHobson M Bryant, PA-C  oxyCODONE-acetaminophen (PERCOCET) 7.5-325 MG per tablet Take 1 tablet by mouth every 4 (four) hours as needed for pain. 08/05/13   Dione Boozeavid Glick, MD  oxyCODONE-acetaminophen (PERCOCET/ROXICET) 5-325 MG per tablet Take 1 tablet by mouth  every 6 (six) hours as needed for severe pain. 05/19/13   Deatra CanterWilliam J Oxford, FNP  promethazine (PHENERGAN) 25 MG tablet Take 1 tablet (25 mg total) by mouth every 6 (six) hours as needed for nausea or vomiting. 05/19/13   Deatra CanterWilliam J Oxford, FNP  promethazine (PHENERGAN) 25 MG tablet Take 1 tablet (25 mg total) by mouth every 6 (six) hours as needed for nausea or vomiting. 08/05/13   Dione Boozeavid Glick, MD  QUEtiapine (SEROQUEL) 100 MG tablet Take 100 mg by mouth at bedtime.    Historical Khiem Gargis, MD  tamsulosin (FLOMAX) 0.4 MG CAPS capsule Take 1 capsule (0.4 mg total) by mouth daily. 08/05/13   Dione Boozeavid Glick, MD   Triage Vitals: BP 149/102  Pulse  110  Temp(Src) 98.8 F (37.1 C) (Oral)  Resp 24  Ht 5\' 4"  (1.626 m)  Wt 217 lb (98.431 kg)  BMI 37.23 kg/m2  SpO2 99%  LMP 07/29/2013  Physical Exam  Nursing note and vitals reviewed. Constitutional: She is oriented to person, place, and time. She appears well-developed and well-nourished. No distress.  Appears uncomfortable.   HENT:  Head: Normocephalic and atraumatic.  Eyes: Conjunctivae and EOM are normal.  Neck: Neck supple. No tracheal deviation present.  Cardiovascular: Normal rate, regular rhythm and normal heart sounds.   No murmur heard. Pulmonary/Chest: Effort normal. No respiratory distress. She has no wheezes. She has no rales.  Abdominal: Soft. Bowel sounds are normal. She exhibits no distension. There is tenderness. There is no rebound and no guarding.  Mild right CVA tenderness. Moderate right lower quadrant tenderness.   Musculoskeletal: Normal range of motion. She exhibits no edema.  Neurological: She is alert and oriented to person, place, and time. She has normal reflexes. No cranial nerve deficit. Coordination normal.  Skin: Skin is warm and dry. No rash noted.  Psychiatric: She has a normal mood and affect. Her behavior is normal.    ED Course  Procedures (including critical care time)  DIAGNOSTIC STUDIES: Oxygen Saturation is 99% on room air, normal by my interpretation.    COORDINATION OF CARE:  11:37 PM- Discussed treatment plan with patient, and the patient agreed to the plan. The plan includes lab work, a CT scan, and pain medication.   Labs Review Results for orders placed during the hospital encounter of 08/06/13  URINALYSIS, ROUTINE W REFLEX MICROSCOPIC      Result Value Ref Range   Color, Urine YELLOW  YELLOW   APPearance HAZY (*) CLEAR   Specific Gravity, Urine 1.015  1.005 - 1.030   pH 7.0  5.0 - 8.0   Glucose, UA NEGATIVE  NEGATIVE mg/dL   Hgb urine dipstick LARGE (*) NEGATIVE   Bilirubin Urine NEGATIVE  NEGATIVE   Ketones, ur  NEGATIVE  NEGATIVE mg/dL   Protein, ur NEGATIVE  NEGATIVE mg/dL   Urobilinogen, UA 0.2  0.0 - 1.0 mg/dL   Nitrite POSITIVE (*) NEGATIVE   Leukocytes, UA SMALL (*) NEGATIVE  PREGNANCY, URINE      Result Value Ref Range   Preg Test, Ur NEGATIVE  NEGATIVE  URINE MICROSCOPIC-ADD ON      Result Value Ref Range   WBC, UA 3-6  <3 WBC/hpf   RBC / HPF 21-50  <3 RBC/hpf   Bacteria, UA FEW (*) RARE  CBC WITH DIFFERENTIAL      Result Value Ref Range   WBC 5.0  4.0 - 10.5 K/uL   RBC 3.86 (*) 3.87 - 5.11 MIL/uL   Hemoglobin 11.7 (*)  12.0 - 15.0 g/dL   HCT 16.1 (*) 09.6 - 04.5 %   MCV 92.5  78.0 - 100.0 fL   MCH 30.3  26.0 - 34.0 pg   MCHC 32.8  30.0 - 36.0 g/dL   RDW 40.9  81.1 - 91.4 %   Platelets 336  150 - 400 K/uL   Neutrophils Relative % 52  43 - 77 %   Neutro Abs 2.6  1.7 - 7.7 K/uL   Lymphocytes Relative 40  12 - 46 %   Lymphs Abs 2.0  0.7 - 4.0 K/uL   Monocytes Relative 6  3 - 12 %   Monocytes Absolute 0.3  0.1 - 1.0 K/uL   Eosinophils Relative 2  0 - 5 %   Eosinophils Absolute 0.1  0.0 - 0.7 K/uL   Basophils Relative 0  0 - 1 %   Basophils Absolute 0.0  0.0 - 0.1 K/uL  BASIC METABOLIC PANEL      Result Value Ref Range   Sodium 137  137 - 147 mEq/L   Potassium 3.5 (*) 3.7 - 5.3 mEq/L   Chloride 99  96 - 112 mEq/L   CO2 25  19 - 32 mEq/L   Glucose, Bld 101 (*) 70 - 99 mg/dL   BUN 7  6 - 23 mg/dL   Creatinine, Ser 7.82  0.50 - 1.10 mg/dL   Calcium 9.4  8.4 - 95.6 mg/dL   GFR calc non Af Amer >90  >90 mL/min   GFR calc Af Amer >90  >90 mL/min   Imaging Review Ct Abdomen Pelvis Wo Contrast  08/07/2013   CLINICAL DATA:  Right sided flank pain.  EXAM: CT ABDOMEN AND PELVIS WITHOUT CONTRAST  TECHNIQUE: Multidetector CT imaging of the abdomen and pelvis was performed following the standard protocol without IV contrast.  COMPARISON:  04/30/2013  FINDINGS: The lung bases are clear. No pleural or pericardial effusion. No focal liver abnormality identified. Previous cholecystectomy. No  biliary dilatation. Normal appearance of the pancreas. The spleen is normal.  The adrenal glands are normal. Stone within the inferior pole there are several stones within the inferior pole the left kidney measuring up to 5 mm. Stone within the lower pole calculus of the right kidney measures 5 mm, image 62/series 4. The urinary bladder appears normal. No ureteral calculi identified. The uterus and the adnexal structures are unremarkable.  Normal caliber of the abdominal aorta. No aneurysm. No upper abdominal adenopathy. There is no pelvic adenopathy. Prominent bilateral inguinal lymph nodes are identified. Right inguinal node now measures 1.2 cm. Previously this measured 0.9 cm. No free fluid or fluid collections within the abdomen or pelvis.  The stomach is normal. The small bowel loops have a normal course and caliber. No obstruction. Normal appearance of the proximal colon. Multiple distal colonic diverticula are identified without acute inflammation. No free fluid or fluid collections within the abdomen or pelvis. Injections sites are identified within both buttock areas.  Review of the visualized bony structures is on unremarkable.  IMPRESSION:  1. Bilateral renal calculi.  No evidence for obstructive uropathy. 2. Prior cholecystectomy. 3. Increase in size of inguinal lymph nodes. Findings are favored to represent reactive adenopathy.   Electronically Signed   By: Signa Kell M.D.   On: 08/07/2013 01:31   MDM   Final diagnoses:  Flank pain  UTI (lower urinary tract infection)    Ongoing flank pain which has not been adequately managed with outpatient narcotics. Old records are  reviewed and she does have history of kidney stones. On previous visit, she actually had been arranged for ultrasound but stated she could not stay for that. Since she is not responding as expected, CT will be obtained in spite of her numerous prior CTs. CT shows no evidence of hydronephrosis. She continues to require large  amount of narcotics to get even minimal relief of her pain. Pain is clearly out of proportion to physical findings. It is decided to refer her to urology who, hopefully, will be allowed to find a cause for her pain so that more specific treatment can be given.   I personally performed the services described in this documentation, which was scribed in my presence. The recorded information has been reviewed and is accurate.      Dione Booze, MD 08/07/13 [phone]

## 2013-08-07 ENCOUNTER — Emergency Department (HOSPITAL_COMMUNITY): Payer: Medicaid Other

## 2013-08-07 ENCOUNTER — Telehealth (HOSPITAL_COMMUNITY): Payer: Self-pay

## 2013-08-07 LAB — CBC WITH DIFFERENTIAL/PLATELET
BASOS ABS: 0 10*3/uL (ref 0.0–0.1)
Basophils Relative: 0 % (ref 0–1)
EOS PCT: 2 % (ref 0–5)
Eosinophils Absolute: 0.1 10*3/uL (ref 0.0–0.7)
HCT: 35.7 % — ABNORMAL LOW (ref 36.0–46.0)
Hemoglobin: 11.7 g/dL — ABNORMAL LOW (ref 12.0–15.0)
LYMPHS ABS: 2 10*3/uL (ref 0.7–4.0)
LYMPHS PCT: 40 % (ref 12–46)
MCH: 30.3 pg (ref 26.0–34.0)
MCHC: 32.8 g/dL (ref 30.0–36.0)
MCV: 92.5 fL (ref 78.0–100.0)
Monocytes Absolute: 0.3 10*3/uL (ref 0.1–1.0)
Monocytes Relative: 6 % (ref 3–12)
NEUTROS ABS: 2.6 10*3/uL (ref 1.7–7.7)
NEUTROS PCT: 52 % (ref 43–77)
PLATELETS: 336 10*3/uL (ref 150–400)
RBC: 3.86 MIL/uL — AB (ref 3.87–5.11)
RDW: 12.8 % (ref 11.5–15.5)
WBC: 5 10*3/uL (ref 4.0–10.5)

## 2013-08-07 LAB — BASIC METABOLIC PANEL
BUN: 7 mg/dL (ref 6–23)
CHLORIDE: 99 meq/L (ref 96–112)
CO2: 25 meq/L (ref 19–32)
Calcium: 9.4 mg/dL (ref 8.4–10.5)
Creatinine, Ser: 0.69 mg/dL (ref 0.50–1.10)
GFR calc Af Amer: >90 mL/min (ref >90)
GFR calc non Af Amer: >90 mL/min (ref >90)
GLUCOSE: 101 mg/dL — AB (ref 70–99)
POTASSIUM: 3.5 meq/L — AB (ref 3.7–5.3)
SODIUM: 137 meq/L (ref 137–147)

## 2013-08-07 LAB — URINE CULTURE

## 2013-08-07 MED ORDER — HYDROMORPHONE HCL PF 2 MG/ML IJ SOLN
2.0000 mg | Freq: Once | INTRAMUSCULAR | Status: AC
  Administered 2013-08-07: 2 mg via INTRAVENOUS
  Filled 2013-08-07: qty 1

## 2013-08-07 MED ORDER — HYDROMORPHONE HCL PF 2 MG/ML IJ SOLN
INTRAMUSCULAR | Status: AC
  Filled 2013-08-07: qty 1

## 2013-08-07 MED ORDER — SODIUM CHLORIDE 0.9 % IV SOLN: 1000.0000 mL | INTRAVENOUS | Status: DC

## 2013-08-07 MED ORDER — PROMETHAZINE HCL 25 MG/ML IJ SOLN
INTRAMUSCULAR | Status: AC
  Filled 2013-08-07: qty 1

## 2013-08-07 MED ORDER — CEPHALEXIN 500 MG PO CAPS: 500.0000 mg | ORAL_CAPSULE | Freq: Four times a day (QID) | ORAL | Status: DC

## 2013-08-07 MED ORDER — SODIUM CHLORIDE 0.9 % IV SOLN
1000.0000 mL | Freq: Once | INTRAVENOUS | Status: AC
  Administered 2013-08-07: 1000 mL via INTRAVENOUS

## 2013-08-07 MED ORDER — HYDROMORPHONE HCL PF 2 MG/ML IJ SOLN
2.0000 mg | Freq: Once | INTRAMUSCULAR | Status: AC
  Administered 2013-08-07: 2 mg via INTRAMUSCULAR

## 2013-08-07 MED ORDER — HYDROMORPHONE HCL PF 2 MG/ML IJ SOLN
2.0000 mg | Freq: Once | INTRAMUSCULAR | Status: DC
  Filled 2013-08-07: qty 1

## 2013-08-07 MED ORDER — PROMETHAZINE HCL 25 MG/ML IJ SOLN: 25.0000 mg | Freq: Once | INTRAMUSCULAR | Status: DC

## 2013-08-07 MED ORDER — PROMETHAZINE HCL 25 MG/ML IJ SOLN
25.0000 mg | Freq: Once | INTRAMUSCULAR | Status: AC
Start: 2013-08-07 — End: 2013-08-07
  Administered 2013-08-07: 25 mg via INTRAVENOUS
  Filled 2013-08-07: qty 1

## 2013-08-07 NOTE — ED Notes (Signed)
Patient states that the medication is still not helping. States that she wants an IV and she wants her meds given through the IV because that is what worked the other day. MD aware, charge nurse and [facility] at bedside to attempt to stick patient.

## 2013-08-07 NOTE — ED Notes (Signed)
Post ED Visit - Positive Culture Follow-up  Culture report reviewed by antimicrobial stewardship pharmacist: []  Wes Dulaney, Pharm.D., BCPS []  Celedonio MiyamotoJeremy Frens, [address] BCPS [x]  Georgina PillionElizabeth Martin, [address]Ph[address] BCPS []  OgemaMinh Pham, [place] BCPS, AAHIVP []  Estella HuskMichelle Turner, Pharm.D., BCPS, AAHIVP []  Harvie JuniorNathan Cope, Pharm.D.  Positive urine culture Treated with keflex, organism sensitive to the same and no further patient follow-up is required at this time.  Ashley JacobsFesterman, Toni C 08/07/2013, 11:53 AM

## 2013-08-07 NOTE — ED Notes (Signed)
Patient resting in bed with eyes closed, no longer crying at this time.

## 2013-08-07 NOTE — Discharge Instructions (Signed)
Urinary Tract Infection °Urinary tract infections (UTIs) can develop anywhere along your urinary tract. Your urinary tract is your body's drainage system for removing wastes and extra water. Your urinary tract includes two kidneys, two ureters, a bladder, and a urethra. Your kidneys are a pair of bean-shaped organs. Each kidney is about the size of your fist. They are located below your ribs, one on each side of your spine. °CAUSES °Infections are caused by microbes, which are microscopic organisms, including fungi, viruses, and bacteria. These organisms are so small that they can only be seen through a microscope. Bacteria are the microbes that most commonly cause UTIs. °SYMPTOMS  °Symptoms of UTIs may vary by age and gender of the patient and by the location of the infection. Symptoms in young women typically include a frequent and intense urge to urinate and a painful, burning feeling in the bladder or urethra during urination. Older women and men are more likely to be tired, shaky, and weak and have muscle aches and abdominal pain. A fever may mean the infection is in your kidneys. Other symptoms of a kidney infection include pain in your back or sides below the ribs, nausea, and vomiting. °DIAGNOSIS °To diagnose a UTI, your caregiver will ask you about your symptoms. Your caregiver also will ask to provide a urine sample. The urine sample will be tested for bacteria and white blood cells. White blood cells are made by your body to help fight infection. °TREATMENT  °Typically, UTIs can be treated with medication. Because most UTIs are caused by a bacterial infection, they usually can be treated with the use of antibiotics. The choice of antibiotic and length of treatment depend on your symptoms and the type of bacteria causing your infection. °HOME CARE INSTRUCTIONS °· If you were prescribed antibiotics, take them exactly as your caregiver instructs you. Finish the medication even if you feel better after you  have only taken some of the medication. °· Drink enough water and fluids to keep your urine clear or pale yellow. °· Avoid caffeine, tea, and carbonated beverages. They tend to irritate your bladder. °· Empty your bladder often. Avoid holding urine for long periods of time. °· Empty your bladder before and after sexual intercourse. °· After a bowel movement, women should cleanse from front to back. Use each tissue only once. °SEEK MEDICAL CARE IF:  °· You have back pain. °· You develop a fever. °· Your symptoms do not begin to resolve within 3 days. °SEEK IMMEDIATE MEDICAL CARE IF:  °· You have severe back pain or lower abdominal pain. °· You develop chills. °· You have nausea or vomiting. °· You have continued burning or discomfort with urination. °MAKE SURE YOU:  °· Understand these instructions. °· Will watch your condition. °· Will get help right away if you are not doing well or get worse. °Document Released: 11/05/2004 Document Revised: 07/28/2011 Document Reviewed: 03/06/2011 °ExitCare® Patient Information ©2015 [employer]. This information is not intended to replace advice given to you by your health care provider. Make sure you discuss any questions you have with your health care provider. ° °Cephalexin tablets or capsules °What is this medicine? °CEPHALEXIN (sef a LEX in) is a cephalosporin antibiotic. It is used to treat certain kinds of bacterial infections It will not work for colds, flu, or other viral infections. °This medicine may be used for other purposes; ask your health care provider or pharmacist if you have questions. °COMMON BRAND NAME(S): Biocef, Keflex, Keftab °What should I   tell my health care provider before I take this medicine? °They need to know if you have any of these conditions: °-kidney disease °-stomach or intestine problems, especially colitis °-an unusual or allergic reaction to cephalexin, other cephalosporins, penicillins, other antibiotics, medicines, foods, dyes or  preservatives °-pregnant or trying to get pregnant °-breast-feeding °How should I use this medicine? °Take this medicine by mouth with a full glass of water. Follow the directions on the prescription label. This medicine can be taken with or without food. Take your medicine at regular intervals. Do not take your medicine more often than directed. Take all of your medicine as directed even if you think you are better. Do not skip doses or stop your medicine early. °Talk to your pediatrician regarding the use of this medicine in children. While this drug may be prescribed for selected conditions, precautions do apply. °Overdosage: If you think you have taken too much of this medicine contact a poison control center or emergency room at once. °NOTE: This medicine is only for you. Do not share this medicine with others. °What if I miss a dose? °If you miss a dose, take it as soon as you can. If it is almost time for your next dose, take only that dose. Do not take double or extra doses. There should be at least 4 to 6 hours between doses. °What may interact with this medicine? °-probenecid °-some other antibiotics °This list may not describe all possible interactions. Give your health care provider a list of all the medicines, herbs, non-prescription drugs, or dietary supplements you use. Also tell them if you smoke, drink alcohol, or use illegal drugs. Some items may interact with your medicine. °What should I watch for while using this medicine? °Tell your doctor or health care professional if your symptoms do not begin to improve in a few days. °Do not treat diarrhea with over the counter products. Contact your doctor if you have diarrhea that lasts more than 2 days or if it is severe and watery. °If you have diabetes, you may get a false-positive result for sugar in your urine. Check with your doctor or health care professional. °What side effects may I notice from receiving this medicine? °Side effects that you  should report to your doctor or health care professional as soon as possible: °-allergic reactions like skin rash, itching or hives, swelling of the face, lips, or tongue °-breathing problems °-pain or trouble passing urine °-redness, blistering, peeling or loosening of the skin, including inside the mouth °-severe or watery diarrhea °-unusually weak or tired °-yellowing of the eyes, skin °Side effects that usually do not require medical attention (report to your doctor or health care professional if they continue or are bothersome): °-gas or heartburn °-genital or anal irritation °-headache °-joint or muscle pain °-nausea, vomiting °This list may not describe all possible side effects. Call your doctor for medical advice about side effects. You may report side effects to FDA at 1-800-FDA-1088. °Where should I keep my medicine? °Keep out of the reach of children. °Store at room temperature between 59 and 86 degrees F (15 and 30 degrees C). Throw away any unused medicine after the expiration date. °NOTE: This sheet is a summary. It may not cover all possible information. If you have questions about this medicine, talk to your doctor, pharmacist, or health care provider. °© 2015, [employer]vier/[employer]. (2007-05-02 17:09:13) ° °

## 2013-08-09 LAB — URINE CULTURE: Colony Count: >=100000

## 2013-08-10 NOTE — Telephone Encounter (Signed)
Post ED Visit - Positive Culture Follow-up  Culture report reviewed by antimicrobial stewardship pharmacist: []  Wes Dulaney, Pharm.D., BCPS []  Celedonio MiyamotoJeremy Frens, Pharm.D., BCPS [x]  Georgina PillionElizabeth Martin, Pharm.D., BCPS []  RichmondMinh Pham, [place]Pharm[place]D., BCPS, AAHIVP []  Estella HuskMichelle Turner, Pharm.D., BCPS, AAHIVP  Positive urine culture Treated with Keflex, organism sensitive to the same and no further patient follow-up is required at this time.  [place], Jenel LucksKylie 08/10/2013, 11:59 AM

## 2013-09-11 ENCOUNTER — Emergency Department (HOSPITAL_COMMUNITY): Payer: Medicaid Other

## 2013-09-11 ENCOUNTER — Inpatient Hospital Stay (HOSPITAL_COMMUNITY)
Admission: EM | Admit: 2013-09-11 | Discharge: 2013-09-16 | DRG: 669 | Disposition: A | Discharge status: Home Health | Payer: Medicaid Other | Attending: Family Medicine | Admitting: Family Medicine

## 2013-09-11 ENCOUNTER — Encounter (HOSPITAL_COMMUNITY): Payer: Self-pay | Admitting: Emergency Medicine

## 2013-09-11 DIAGNOSIS — F17200 Nicotine dependence, unspecified, uncomplicated: Secondary | ICD-10-CM | POA: Diagnosis present

## 2013-09-11 DIAGNOSIS — R7881 Bacteremia: Secondary | ICD-10-CM | POA: Diagnosis present

## 2013-09-11 DIAGNOSIS — A498 Other bacterial infections of unspecified site: Secondary | ICD-10-CM | POA: Diagnosis present

## 2013-09-11 DIAGNOSIS — Z833 Family history of diabetes mellitus: Secondary | ICD-10-CM

## 2013-09-11 DIAGNOSIS — Z79899 Other long term (current) drug therapy: Secondary | ICD-10-CM

## 2013-09-11 DIAGNOSIS — R109 Unspecified abdominal pain: Secondary | ICD-10-CM | POA: Diagnosis present

## 2013-09-11 DIAGNOSIS — R110 Nausea: Secondary | ICD-10-CM | POA: Diagnosis present

## 2013-09-11 DIAGNOSIS — N10 Acute tubulo-interstitial nephritis: Secondary | ICD-10-CM | POA: Diagnosis present

## 2013-09-11 DIAGNOSIS — N1330 Unspecified hydronephrosis: Secondary | ICD-10-CM | POA: Diagnosis present

## 2013-09-11 DIAGNOSIS — N949 Unspecified condition associated with female genital organs and menstrual cycle: Secondary | ICD-10-CM | POA: Diagnosis present

## 2013-09-11 DIAGNOSIS — Z91041 Radiographic dye allergy status: Secondary | ICD-10-CM

## 2013-09-11 DIAGNOSIS — T8384XA Pain from genitourinary prosthetic devices, implants and grafts, initial encounter: Secondary | ICD-10-CM

## 2013-09-11 DIAGNOSIS — T83122S Displacement of urinary stent, sequela: Secondary | ICD-10-CM

## 2013-09-11 DIAGNOSIS — N201 Calculus of ureter: Principal | ICD-10-CM | POA: Diagnosis present

## 2013-09-11 DIAGNOSIS — A4902 Methicillin resistant Staphylococcus aureus infection, unspecified site: Secondary | ICD-10-CM | POA: Diagnosis present

## 2013-09-11 DIAGNOSIS — N390 Urinary tract infection, site not specified: Secondary | ICD-10-CM | POA: Diagnosis present

## 2013-09-11 DIAGNOSIS — N23 Unspecified renal colic: Secondary | ICD-10-CM

## 2013-09-11 DIAGNOSIS — Z791 Long term (current) use of non-steroidal anti-inflammatories (NSAID): Secondary | ICD-10-CM

## 2013-09-11 DIAGNOSIS — Z888 Allergy status to other drugs, medicaments and biological substances status: Secondary | ICD-10-CM

## 2013-09-11 DIAGNOSIS — J45909 Unspecified asthma, uncomplicated: Secondary | ICD-10-CM | POA: Diagnosis present

## 2013-09-11 DIAGNOSIS — G8929 Other chronic pain: Secondary | ICD-10-CM | POA: Diagnosis present

## 2013-09-11 DIAGNOSIS — N200 Calculus of kidney: Secondary | ICD-10-CM | POA: Diagnosis present

## 2013-09-11 DIAGNOSIS — Z885 Allergy status to narcotic agent status: Secondary | ICD-10-CM

## 2013-09-11 DIAGNOSIS — M549 Dorsalgia, unspecified: Secondary | ICD-10-CM | POA: Diagnosis present

## 2013-09-11 LAB — CBC WITH DIFFERENTIAL/PLATELET
Basophils Absolute: 0.1 10*3/uL (ref 0.0–0.1)
Basophils Relative: 1 % (ref 0–1)
EOS PCT: 2 % (ref 0–5)
Eosinophils Absolute: 0.1 10*3/uL (ref 0.0–0.7)
HEMATOCRIT: 34.9 % — AB (ref 36.0–46.0)
Hemoglobin: 11.3 g/dL — ABNORMAL LOW (ref 12.0–15.0)
LYMPHS ABS: 2.8 10*3/uL (ref 0.7–4.0)
Lymphocytes Relative: 40 % (ref 12–46)
MCH: 30.6 pg (ref 26.0–34.0)
MCHC: 32.4 g/dL (ref 30.0–36.0)
MCV: 94.6 fL (ref 78.0–100.0)
MONO ABS: 0.6 10*3/uL (ref 0.1–1.0)
Monocytes Relative: 8 % (ref 3–12)
NEUTROS ABS: 3.3 10*3/uL (ref 1.7–7.7)
Neutrophils Relative %: 49 % (ref 43–77)
Platelets: 407 10*3/uL — ABNORMAL HIGH (ref 150–400)
RBC: 3.69 MIL/uL — AB (ref 3.87–5.11)
RDW: 13.6 % (ref 11.5–15.5)
WBC: 6.9 10*3/uL (ref 4.0–10.5)

## 2013-09-11 LAB — URINALYSIS, ROUTINE W REFLEX MICROSCOPIC
BILIRUBIN URINE: NEGATIVE
Glucose, UA: NEGATIVE mg/dL
KETONES UR: NEGATIVE mg/dL
Leukocytes, UA: NEGATIVE
NITRITE: NEGATIVE
PROTEIN: NEGATIVE mg/dL
Specific Gravity, Urine: 1.015 (ref 1.005–1.030)
UROBILINOGEN UA: 0.2 mg/dL (ref 0.0–1.0)
pH: 6 (ref 5.0–8.0)

## 2013-09-11 LAB — BASIC METABOLIC PANEL
Anion gap: 11 (ref 5–15)
BUN: 9 mg/dL (ref 6–23)
CALCIUM: 9.3 mg/dL (ref 8.4–10.5)
CO2: 26 meq/L (ref 19–32)
Chloride: 102 mEq/L (ref 96–112)
Creatinine, Ser: 0.78 mg/dL (ref 0.50–1.10)
GFR calc Af Amer: >90 mL/min (ref >90)
GFR calc non Af Amer: >90 mL/min (ref >90)
Glucose, Bld: 93 mg/dL (ref 70–99)
Potassium: 4.2 mEq/L (ref 3.7–5.3)
SODIUM: 139 meq/L (ref 137–147)

## 2013-09-11 LAB — PREGNANCY, URINE: Preg Test, Ur: NEGATIVE

## 2013-09-11 LAB — URINE MICROSCOPIC-ADD ON

## 2013-09-11 LAB — LACTIC ACID, PLASMA: Lactic Acid, Venous: 0.9 mmol/L (ref 0.5–2.2)

## 2013-09-11 LAB — I-STAT CG4 LACTIC ACID, ED: Lactic Acid, Venous: 0.85 mmol/L (ref 0.5–2.2)

## 2013-09-11 MED ORDER — SODIUM CHLORIDE 0.9 % IV SOLN
Freq: Once | INTRAVENOUS | Status: AC
  Administered 2013-09-12: via INTRAVENOUS

## 2013-09-11 MED ORDER — PROMETHAZINE HCL 25 MG/ML IJ SOLN
12.5000 mg | Freq: Once | INTRAMUSCULAR | Status: AC
  Administered 2013-09-11: 12.5 mg via INTRAVENOUS
  Filled 2013-09-11: qty 1

## 2013-09-11 MED ORDER — VANCOMYCIN HCL IN DEXTROSE 1-5 GM/200ML-% IV SOLN
1000.0000 mg | Freq: Three times a day (TID) | INTRAVENOUS | Status: DC
  Administered 2013-09-12: 1000 mg via INTRAVENOUS
  Filled 2013-09-11 (×2): qty 200

## 2013-09-11 MED ORDER — HYDROMORPHONE HCL PF 1 MG/ML IJ SOLN
1.0000 mg | INTRAMUSCULAR | Status: AC | PRN
  Administered 2013-09-11 (×2): 1 mg via INTRAVENOUS
  Filled 2013-09-11 (×2): qty 1

## 2013-09-11 MED ORDER — SODIUM CHLORIDE 0.9 % IV BOLUS (SEPSIS)
1000.0000 mL | Freq: Once | INTRAVENOUS | Status: AC
  Administered 2013-09-11: 1000 mL via INTRAVENOUS

## 2013-09-11 NOTE — ED Provider Notes (Signed)
CSN: [id number]     Arrival date & time 09/11/13  1940 History   This chart was scribed for Rolland Porter, MD by Milly Jakob, ED Scribe. The patient was seen in room APA06/APA06. Patient's care was started at 9:19 PM.   Chief Complaint  Patient presents with  . Flank Pain   The history is provided by the patient. No language interpreter was used.   HPI Comments: Is with abdominal pain and reports that she "has a blood infection". Patient was seen originally at [facility] 6 days ago. Diagnosed with urinary tract infection admitted to the hospital. Had a CT scan that showed a ureteral stone. Underwent stent placement with urology. Urine culture grew Escherichia coli. She was discharged on Friday, 3 days ago.  He states that she had continued pain and vomiting at discharge and after discharge. She was contacted at home yesterday with a positive blood culture growing MRSA. She presented back to [facility] hospital yesterday. Was started on an IV antibiotic. She states that she had continued pain and vomiting and "they wouldn't do anything about it". She left there AMA and presents here.  Past Medical History  Diagnosis Date  . Ovarian cyst   . UTI (lower urinary tract infection)   . Asthma   . Kidney stones   . Calcium oxalate renal stones   . Chronic pelvic pain in female    Past Surgical History  Procedure Laterality Date  . Ovarian cyst removal    . Lithotripsy    . Laparoscopic tubal ligation    . Cholecystectomy    . Appendectomy    . Tee without cardioversion N/A 10/31/2012    Procedure: TRANSESOPHAGEAL ECHOCARDIOGRAM (TEE);  Surgeon: Laurey Morale, MD;  Location: [facility] ENDOSCOPY;  Service: Cardiovascular;  Laterality: N/A;   Family History  Problem Relation Age of Onset  . Diabetes Other   . Seizures Other   . Cancer Other    History  Substance Use Topics  . Smoking status: Current Every Day Smoker -- 0.50 packs/day for 14 years    Types: Cigarettes  . Smokeless tobacco:  Never Used  . Alcohol Use: No   OB History   Grav Para Term Preterm Abortions TAB SAB Ect Mult Living   13 2 1 1 10  9 1  2      Review of Systems  Constitutional: Positive for diaphoresis. Negative for fever, chills, appetite change and fatigue.  HENT: Negative for mouth sores, sore throat and trouble swallowing.   Eyes: Negative for visual disturbance.  Respiratory: Negative for cough, chest tightness, shortness of breath and wheezing.   Cardiovascular: Negative for chest pain.  Gastrointestinal: Positive for nausea and abdominal pain. Negative for vomiting, diarrhea and abdominal distention.  Endocrine: Negative for polydipsia, polyphagia and polyuria.  Genitourinary: Negative for dysuria, frequency and hematuria.  Musculoskeletal: Negative for gait problem.  Skin: Negative for color change, pallor and rash.  Neurological: Negative for dizziness, syncope, light-headedness and headaches.  Hematological: Does not bruise/bleed easily.  Psychiatric/Behavioral: Negative for behavioral problems and confusion.    Allergies  Iohexol; Zofran; Ketorolac tromethamine; and Morphine and related  Home Medications   Prior to Admission medications   Medication Sig Start Date End Date Taking? Authorizing Provider  ciprofloxacin (CIPRO) 500 MG tablet Take 1 tablet (500 mg total) by mouth 2 (two) times daily. 05/24/13  Yes Deatra Canter, FNP  ibuprofen (ADVIL,MOTRIN) 800 MG tablet Take 800 mg by mouth daily as needed for pain. 11/14/11  Yes Kathie Dike, PA-C  QUEtiapine (SEROQUEL) 100 MG tablet Take 200 mg by mouth at bedtime.    Yes Historical Provider, MD   Triage Vitals: BP 167/105  Pulse 91  Temp(Src) 98.2 F (36.8 C) (Oral)  Resp 20  Ht 5\' 4"  (1.626 m)  Wt 217 lb (98.431 kg)  BMI 37.23 kg/m2  SpO2 100%  LMP 08/28/2013 Physical Exam  Constitutional: She is oriented to person, place, and time. She appears well-developed and well-nourished. No distress.  Patient moaning and  holding right side.   HENT:  Head: Normocephalic.  Eyes: Conjunctivae are normal. Pupils are equal, round, and reactive to light. No scleral icterus.  Neck: Normal range of motion. Neck supple. No thyromegaly present.  Cardiovascular: Normal rate and regular rhythm.  Exam reveals no gallop and no friction rub.   No murmur heard. Pulmonary/Chest: Effort normal and breath sounds normal. No respiratory distress. She has no wheezes. She has no rales.  Abdominal: Soft. Bowel sounds are normal. She exhibits no distension. There is tenderness (diffuse). There is no rebound.  No peritoneal irritation.   Musculoskeletal: Normal range of motion.  Neurological: She is alert and oriented to person, place, and time.  Skin: Skin is warm and dry. No rash noted.  Psychiatric: She has a normal mood and affect. Her behavior is normal.    ED Course  Procedures (including critical care time) DIAGNOSTIC STUDIES: Oxygen Saturation is 100% on room air, normal by my interpretation.    COORDINATION OF CARE: 9:25 PM-Discussed treatment plan with pt at bedside and pt agreed to plan.   Labs Review Labs Reviewed  CBC WITH DIFFERENTIAL - Abnormal; Notable for the following:    RBC 3.69 (*)    Hemoglobin 11.3 (*)    HCT 34.9 (*)    Platelets 407 (*)    All other components within normal limits  URINALYSIS, ROUTINE W REFLEX MICROSCOPIC - Abnormal; Notable for the following:    Hgb urine dipstick SMALL (*)    All other components within normal limits  URINE MICROSCOPIC-ADD ON - Abnormal; Notable for the following:    Squamous Epithelial / LPF FEW (*)    All other components within normal limits  CULTURE, BLOOD (ROUTINE X 2)  CULTURE, BLOOD (ROUTINE X 2)  URINE CULTURE  BASIC METABOLIC PANEL  PREGNANCY, URINE  LACTIC ACID, PLASMA  I-STAT CG4 LACTIC ACID, ED    Imaging Review Dg Abd 1 View  09/11/2013   CLINICAL DATA:  Flank pain. Right ureteral stent placement 09/07/2013  EXAM: ABDOMEN - 1 VIEW   COMPARISON:  09/08/2013  FINDINGS: Unchanged orientation of an internal right ureteral stent. Upper and lower limbs are redundant but fully formed. A 5 mm stone continues to project at the level of the lower limb, at the ureteral vesicular junction based on interval CT.  Intrarenal calculi are better discerned on preceding CT. There is a large volume of formed stool. No concerning intra-abdominal mass effect. Cholecystectomy.  IMPRESSION: 1. Stable positioning of a right ureteral stent. 2. 5 mm stone likely remains at the right UVJ when correlated with CT from yesterday. 3. Possible constipation.   Electronically Signed   By: Tiburcio Pea M.D.   On: 09/11/2013 22:40     EKG Interpretation None      MDM   Final diagnoses:  Acute pyelonephritis  Ureteral colic    Information obtained from [facility] describes the above findings. Pansensitive E. coli. MRSA bacteremia. Right ureteral stent placement. KUB  shows a normal projection of the pigtail stent in the right mid abdomen and pelvis consistent with properly placed ureteral stent. She does not appear septic or toxic. Her renal function is normal.  Blood cultures and urine cultures have been repeated here.  Discussion:  With continued pain and symptoms I did discuss the case with urology on-call Dr. Mena GoesEskridge.  Discussed the patient and her recent care at length. He felt that if the patient on CT tonight did not show perinephric abscess or other complication and had a properly placed stent, normal renal function, that she can be safely treated here with IV antibiotics.  I discussed the case with Dr. Sharl MaLama as well. CT scan is pending. Pt has been given IV vancomycin.  I personally performed the services described in this documentation, which was scribed in my presence. The recorded information has been reviewed and is accurate.    Rolland PorterMark Kayliegh Boyers, MD 09/11/13 2350

## 2013-09-11 NOTE — ED Notes (Addendum)
Patient states "I had a stent put into my right ureter on Thursday, and [facility] called me today and said now I have an infection in my bloodstream and wanted me to come back and get Vancomycin antibiotics." Patient complaining of severe right flank pain since before the surgery that has worsened. States "I came here because I don't want to deal with [facility] anymore."

## 2013-09-12 DIAGNOSIS — N10 Acute tubulo-interstitial nephritis: Secondary | ICD-10-CM

## 2013-09-12 DIAGNOSIS — Z791 Long term (current) use of non-steroidal anti-inflammatories (NSAID): Secondary | ICD-10-CM | POA: Diagnosis not present

## 2013-09-12 DIAGNOSIS — Z885 Allergy status to narcotic agent status: Secondary | ICD-10-CM | POA: Diagnosis not present

## 2013-09-12 DIAGNOSIS — N949 Unspecified condition associated with female genital organs and menstrual cycle: Secondary | ICD-10-CM | POA: Diagnosis present

## 2013-09-12 DIAGNOSIS — N201 Calculus of ureter: Secondary | ICD-10-CM | POA: Diagnosis present

## 2013-09-12 DIAGNOSIS — R109 Unspecified abdominal pain: Secondary | ICD-10-CM

## 2013-09-12 DIAGNOSIS — M549 Dorsalgia, unspecified: Secondary | ICD-10-CM | POA: Diagnosis present

## 2013-09-12 DIAGNOSIS — R110 Nausea: Secondary | ICD-10-CM

## 2013-09-12 DIAGNOSIS — F17200 Nicotine dependence, unspecified, uncomplicated: Secondary | ICD-10-CM | POA: Diagnosis present

## 2013-09-12 DIAGNOSIS — R7881 Bacteremia: Secondary | ICD-10-CM

## 2013-09-12 DIAGNOSIS — Z91041 Radiographic dye allergy status: Secondary | ICD-10-CM | POA: Diagnosis not present

## 2013-09-12 DIAGNOSIS — J45909 Unspecified asthma, uncomplicated: Secondary | ICD-10-CM | POA: Diagnosis present

## 2013-09-12 DIAGNOSIS — Z79899 Other long term (current) drug therapy: Secondary | ICD-10-CM | POA: Diagnosis not present

## 2013-09-12 DIAGNOSIS — Z833 Family history of diabetes mellitus: Secondary | ICD-10-CM | POA: Diagnosis not present

## 2013-09-12 DIAGNOSIS — Z888 Allergy status to other drugs, medicaments and biological substances status: Secondary | ICD-10-CM | POA: Diagnosis not present

## 2013-09-12 DIAGNOSIS — N390 Urinary tract infection, site not specified: Secondary | ICD-10-CM

## 2013-09-12 DIAGNOSIS — A4902 Methicillin resistant Staphylococcus aureus infection, unspecified site: Secondary | ICD-10-CM

## 2013-09-12 DIAGNOSIS — N200 Calculus of kidney: Secondary | ICD-10-CM

## 2013-09-12 DIAGNOSIS — B9562 Methicillin resistant Staphylococcus aureus infection as the cause of diseases classified elsewhere: Secondary | ICD-10-CM | POA: Diagnosis present

## 2013-09-12 DIAGNOSIS — N1330 Unspecified hydronephrosis: Secondary | ICD-10-CM | POA: Diagnosis present

## 2013-09-12 DIAGNOSIS — A498 Other bacterial infections of unspecified site: Secondary | ICD-10-CM | POA: Diagnosis present

## 2013-09-12 DIAGNOSIS — G8929 Other chronic pain: Secondary | ICD-10-CM | POA: Diagnosis present

## 2013-09-12 LAB — CBC
HCT: 33.6 % — ABNORMAL LOW (ref 36.0–46.0)
HEMOGLOBIN: 10.8 g/dL — AB (ref 12.0–15.0)
MCH: 30.2 pg (ref 26.0–34.0)
MCHC: 32.1 g/dL (ref 30.0–36.0)
MCV: 93.9 fL (ref 78.0–100.0)
Platelets: 383 10*3/uL (ref 150–400)
RBC: 3.58 MIL/uL — ABNORMAL LOW (ref 3.87–5.11)
RDW: 13.6 % (ref 11.5–15.5)
WBC: 6.5 10*3/uL (ref 4.0–10.5)

## 2013-09-12 LAB — COMPREHENSIVE METABOLIC PANEL
ALK PHOS: 63 U/L (ref 39–117)
ALT: 23 U/L (ref 0–35)
AST: 21 U/L (ref 0–37)
Albumin: 3.1 g/dL — ABNORMAL LOW (ref 3.5–5.2)
Anion gap: 12 (ref 5–15)
BUN: 6 mg/dL (ref 6–23)
CO2: 25 meq/L (ref 19–32)
Calcium: 9 mg/dL (ref 8.4–10.5)
Chloride: 104 mEq/L (ref 96–112)
Creatinine, Ser: 0.71 mg/dL (ref 0.50–1.10)
GLUCOSE: 87 mg/dL (ref 70–99)
POTASSIUM: 3.7 meq/L (ref 3.7–5.3)
Sodium: 141 mEq/L (ref 137–147)
TOTAL PROTEIN: 7 g/dL (ref 6.0–8.3)
Total Bilirubin: 0.2 mg/dL — ABNORMAL LOW (ref 0.3–1.2)

## 2013-09-12 LAB — MRSA PCR SCREENING: MRSA by PCR: NEGATIVE

## 2013-09-12 MED ORDER — SODIUM CHLORIDE 0.9 % IJ SOLN
9.0000 mL | INTRAMUSCULAR | Status: DC | PRN
Start: 2013-09-12 — End: 2013-09-16

## 2013-09-12 MED ORDER — VANCOMYCIN HCL IN DEXTROSE 1-5 GM/200ML-% IV SOLN
1000.0000 mg | Freq: Three times a day (TID) | INTRAVENOUS | Status: DC
  Administered 2013-09-12 – 2013-09-16 (×14): 1000 mg via INTRAVENOUS
  Filled 2013-09-12 (×17): qty 200

## 2013-09-12 MED ORDER — HYDROMORPHONE HCL PF 1 MG/ML IJ SOLN
1.0000 mg | Freq: Once | INTRAMUSCULAR | Status: AC
  Administered 2013-09-12: 1 mg via INTRAVENOUS
  Filled 2013-09-12: qty 1

## 2013-09-12 MED ORDER — HYDROMORPHONE 0.3 MG/ML IV SOLN
INTRAVENOUS | Status: DC
  Administered 2013-09-12: 4.2 mg via INTRAVENOUS
  Administered 2013-09-12: 19:00:00 via INTRAVENOUS
  Administered 2013-09-13: 1.62 mg via INTRAVENOUS
  Administered 2013-09-13: 1.2 mg via INTRAVENOUS
  Administered 2013-09-13: 16:00:00 via INTRAVENOUS
  Administered 2013-09-13: 0.6 mg via INTRAVENOUS
  Administered 2013-09-13: 0.9 mg via INTRAVENOUS
  Administered 2013-09-13: 06:00:00 via INTRAVENOUS
  Administered 2013-09-13: 2.1 mg via INTRAVENOUS
  Administered 2013-09-13 (×2): 4.8 mg via INTRAVENOUS
  Administered 2013-09-14: 19:00:00 via INTRAVENOUS
  Administered 2013-09-14: 1.2 mg via INTRAVENOUS
  Administered 2013-09-14: 4.8 mg via INTRAVENOUS
  Administered 2013-09-14: 0.3 mg via INTRAVENOUS
  Administered 2013-09-14: 1.14 mg via INTRAVENOUS
  Administered 2013-09-14: 1.2 mg via INTRAVENOUS
  Administered 2013-09-15: 06:00:00 via INTRAVENOUS
  Administered 2013-09-15: 3.55 mg via INTRAVENOUS
  Administered 2013-09-15: via INTRAVENOUS
  Administered 2013-09-15: 7.5 mg via INTRAVENOUS
  Administered 2013-09-15: 3.6 mg via INTRAVENOUS
  Administered 2013-09-15: 3.9 mg via INTRAVENOUS
  Administered 2013-09-15: 0.3 mg via INTRAVENOUS
  Administered 2013-09-16: 12:00:00 via INTRAVENOUS
  Administered 2013-09-16: 1.5 mg via INTRAVENOUS
  Administered 2013-09-16: 2.7 mg via INTRAVENOUS
  Administered 2013-09-16: 2.4 mg via INTRAVENOUS
  Filled 2013-09-12 (×10): qty 25

## 2013-09-12 MED ORDER — DIPHENHYDRAMINE HCL 50 MG/ML IJ SOLN
12.5000 mg | Freq: Four times a day (QID) | INTRAMUSCULAR | Status: DC | PRN
  Administered 2013-09-13 – 2013-09-16 (×7): 12.5 mg via INTRAVENOUS
  Filled 2013-09-12 (×8): qty 1

## 2013-09-12 MED ORDER — QUETIAPINE FUMARATE 200 MG PO TABS
200.0000 mg | ORAL_TABLET | Freq: Every day | ORAL | Status: DC
  Administered 2013-09-12 – 2013-09-15 (×5): 200 mg via ORAL
  Filled 2013-09-12 (×2): qty 1
  Filled 2013-09-12: qty 2
  Filled 2013-09-12 (×3): qty 1

## 2013-09-12 MED ORDER — HYDROMORPHONE HCL PF 1 MG/ML IJ SOLN
1.0000 mg | INTRAMUSCULAR | Status: DC | PRN
  Administered 2013-09-12 (×4): 1 mg via INTRAVENOUS
  Filled 2013-09-12 (×4): qty 1

## 2013-09-12 MED ORDER — ENOXAPARIN SODIUM 40 MG/0.4ML ~~LOC~~ SOLN
40.0000 mg | SUBCUTANEOUS | Status: DC
  Administered 2013-09-12 – 2013-09-16 (×5): 40 mg via SUBCUTANEOUS
  Filled 2013-09-12 (×6): qty 0.4

## 2013-09-12 MED ORDER — NALOXONE HCL 0.4 MG/ML IJ SOLN: 0.4000 mg | INTRAMUSCULAR | Status: DC | PRN

## 2013-09-12 MED ORDER — IBUPROFEN 600 MG PO TABS
600.0000 mg | ORAL_TABLET | Freq: Four times a day (QID) | ORAL | Status: DC
  Administered 2013-09-12 – 2013-09-13 (×4): 600 mg via ORAL
  Filled 2013-09-12 (×7): qty 1

## 2013-09-12 MED ORDER — CIPROFLOXACIN HCL 250 MG PO TABS
500.0000 mg | ORAL_TABLET | Freq: Two times a day (BID) | ORAL | Status: DC
  Administered 2013-09-12: 500 mg via ORAL
  Filled 2013-09-12: qty 2

## 2013-09-12 MED ORDER — DIPHENHYDRAMINE HCL 12.5 MG/5ML PO ELIX
12.5000 mg | ORAL_SOLUTION | Freq: Four times a day (QID) | ORAL | Status: DC | PRN
Start: 2013-09-12 — End: 2013-09-16

## 2013-09-12 MED ORDER — PROMETHAZINE HCL 25 MG/ML IJ SOLN
12.5000 mg | Freq: Four times a day (QID) | INTRAMUSCULAR | Status: DC | PRN
  Administered 2013-09-12 – 2013-09-16 (×14): 12.5 mg via INTRAVENOUS
  Filled 2013-09-12 (×14): qty 1

## 2013-09-12 NOTE — Plan of Care (Signed)
Report called to Mardene CelesteJessica Asaro, RN at [facility].  [facility] called to come and transport pt.

## 2013-09-12 NOTE — Progress Notes (Signed)
Pt arrived to 4th floor via [facility]. PCA running. VSS at this time. Pt in pain, otherwise no distress noted. Will continue to monitor pt closely [name], [name] I

## 2013-09-12 NOTE — Progress Notes (Signed)
This patient was admitted to the hospital earlier this morning by Dr. Sharl MaLama  Patient seen and examined.  She was [facility] with right-sided abdominal pain. She was found to have kidney stones with pyelonephritis and hydronephrosis on the right side. A ureteral stent was placed on the right side. She had continued abdominal pain despite having the stent placed. Urine culture at that time was positive for Escherichia coli, pansensitive. She was discharged approximately 3 days ago with oral antibiotics. She continued to have pain, fever and vomiting after discharge. She was called [facility] to inform her that her blood cultures were positive for MRSA. She returned to the emergency room and received a dose of vancomycin, but left the hospital AGAINST MEDICAL ADVICE since her pain was not adequately controlled. She [facility] for [facility]. [facility] have been obtained which show blood cultures from 7/29, 2/2 positive for MRSA. Repeat cultures from 8/1 are currently in process. She has a urine culture which is positive for Escherichia coli as well as MRSA. Case was discussed in detail with Dr. Algis LimingVanDam, infectious disease. Recommendations were for continued vancomycin. It was felt that with patient's blood stream infection/urinary tract infection, that her ureteral stent will need to be removed. She will also need further workup for MRSA bacteremia with 2-D echocardiogram and likely transesophageal echocardiogram. Recommendations were for transfer to [facility]. Case was also discussed with Dr. Patsi Searsannenbaum on call for urology. Urology will [facility] the [facility] and arrange for stent removal. She has been started on a PCA Dilaudid per urology for pain management. I discussed the case with Dr. Gwenlyn PerkingMadera at [facility] who has [facility] the patient in transfer.  [name]

## 2013-09-12 NOTE — Consult Note (Signed)
Urology Consult   Physician requesting consult: Gwendalyn Ege  Reason for consult: kidney stone  History of Present Illness: [name] is a 28 y.o. female who was admitted to anything yesterday for management of positive blood cultures for MRSA as well as a kidney stone. She presented to [facility] last week with right flank pain. She was found to have a kidney stone and an Escherichia coli UTI. She was treated appropriately with antibiotics and a double-J stent. From what I can see, the stone was proximal on the right ureter at that time. She had resolution of most of her pain. She still has some stent pain and flank pain which is bothersome to her. She has defervesced. She is currently on broad-spectrum antibiotic management of her UTI as well as positive blood cultures.. She does have a prior history of kidney stones.she has had stents placed before. She does have intermittent pelvic pain, this is obviously exacerbated by her stent. She is had no current fever or chills. She has no nausea vomiting.   Past Medical History  Diagnosis Date  . Ovarian cyst   . UTI (lower urinary tract infection)   . Asthma   . Kidney stones   . Calcium oxalate renal stones   . Chronic pelvic pain in female     Past Surgical History  Procedure Laterality Date  . Ovarian cyst removal    . Lithotripsy    . Laparoscopic tubal ligation    . Cholecystectomy    . Appendectomy    . Tee without cardioversion N/A 10/31/2012    Procedure: TRANSESOPHAGEAL ECHOCARDIOGRAM (TEE);  Surgeon: Laurey Morale, MD;  Location: [facility] ENDOSCOPY;  Service: Cardiovascular;  Laterality: N/A;     Current Hospital Medications: Scheduled Meds: . enoxaparin (LOVENOX) injection  40 mg Subcutaneous Q24H  . QUEtiapine  200 mg Oral QHS  . vancomycin  1,000 mg Intravenous Q8H   Continuous Infusions:  PRN Meds:.HYDROmorphone (DILAUDID) injection, promethazine  Allergies:  Allergies  Allergen Reactions  . Iohexol Hives     ALLERGIC TO IVP DYE, HIVE, PT NEEDS PRE MEDS   . Zofran Nausea And Vomiting  . Ketorolac Tromethamine Rash  . Morphine And Related Rash    Family History  Problem Relation Age of Onset  . Diabetes Other   . Seizures Other   . Cancer Other     Social History:  reports that she has been smoking Cigarettes.  She has a 7 pack-year smoking history. She has never used smokeless tobacco. She reports that she does not drink alcohol or use illicit drugs.  ROS: A complete review of systems was performed.  All systems are negative except for pertinent findings as noted.  Physical Exam:  Vital signs in last 24 hours: Temp:  [97.5 F (36.4 C)-98.2 F (36.8 C)] 98.1 F (36.7 C) (08/04 1453) Pulse Rate:  [73-114] 80 (08/04 1453) Resp:  [20] 20 (08/04 1453) BP: (139-167)/(87-105) 156/100 mmHg (08/04 1453) SpO2:  [98 %-100 %] 98 % (08/04 1453) Weight:  [98.431 kg (217 lb)] 98.431 kg (217 lb) (08/03 2021) General:  Alert and oriented, in mild distress HEENT: Normocephalic, atraumatic Neck: No JVD or lymphadenopathy Cardiovascular: Regular rate and rhythm Lungs: Clear bilaterally Abdomen: Soft, obese. Mild right lower quadrant tenderness. Back: moderate right CVA tenderness Extremities: No edema Neurologic: Grossly intact  Laboratory Data:   Recent Labs  09/11/13 2145 09/12/13 0633  WBC 6.9 6.5  HGB 11.3* 10.8*  HCT 34.9* 33.6*  PLT 407* 383  Recent Labs  09/11/13 2145 09/12/13 0633  NA 139 141  K 4.2 3.7  CL 102 104  GLUCOSE 93 87  BUN 9 6  CALCIUM 9.3 9.0  CREATININE 0.78 0.71     Results for orders placed during the hospital encounter of 09/11/13 (from the past 24 hour(s))  PREGNANCY, URINE     Status: None   Collection Time    09/11/13  9:02 PM      Result Value Ref Range   Preg Test, Ur NEGATIVE  NEGATIVE  URINALYSIS, ROUTINE W REFLEX MICROSCOPIC     Status: Abnormal   Collection Time    09/11/13  9:02 PM      Result Value Ref Range   Color, Urine  YELLOW  YELLOW   APPearance CLEAR  CLEAR   Specific Gravity, Urine 1.015  1.005 - 1.030   pH 6.0  5.0 - 8.0   Glucose, UA NEGATIVE  NEGATIVE mg/dL   Hgb urine dipstick SMALL (*) NEGATIVE   Bilirubin Urine NEGATIVE  NEGATIVE   Ketones, ur NEGATIVE  NEGATIVE mg/dL   Protein, ur NEGATIVE  NEGATIVE mg/dL   Urobilinogen, UA 0.2  0.0 - 1.0 mg/dL   Nitrite NEGATIVE  NEGATIVE   Leukocytes, UA NEGATIVE  NEGATIVE  URINE MICROSCOPIC-ADD ON     Status: Abnormal   Collection Time    09/11/13  9:02 PM      Result Value Ref Range   Squamous Epithelial / LPF FEW (*) RARE   WBC, UA 0-2  <3 WBC/hpf   RBC / HPF 7-10  <3 RBC/hpf   Bacteria, UA RARE  RARE  CULTURE, BLOOD (ROUTINE X 2)     Status: None   Collection Time    09/11/13  9:24 PM      Result Value Ref Range   Specimen Description BLOOD RIGHT ANTECUBITAL     Special Requests BOTTLES DRAWN AEROBIC ONLY 4CC     Culture NO GROWTH 1 DAY     Report Status PENDING    BASIC METABOLIC PANEL     Status: None   Collection Time    09/11/13  9:45 PM      Result Value Ref Range   Sodium 139  137 - 147 mEq/L   Potassium 4.2  3.7 - 5.3 mEq/L   Chloride 102  96 - 112 mEq/L   CO2 26  19 - 32 mEq/L   Glucose, Bld 93  70 - 99 mg/dL   BUN 9  6 - 23 mg/dL   Creatinine, Ser 1.61  0.50 - 1.10 mg/dL   Calcium 9.3  8.4 - 09.6 mg/dL   GFR calc non Af Amer >90  >90 mL/min   GFR calc Af Amer >90  >90 mL/min   Anion gap 11  5 - 15  CBC WITH DIFFERENTIAL     Status: Abnormal   Collection Time    09/11/13  9:45 PM      Result Value Ref Range   WBC 6.9  4.0 - 10.5 K/uL   RBC 3.69 (*) 3.87 - 5.11 MIL/uL   Hemoglobin 11.3 (*) 12.0 - 15.0 g/dL   HCT 04.5 (*) 40.9 - 81.1 %   MCV 94.6  78.0 - 100.0 fL   MCH 30.6  26.0 - 34.0 pg   MCHC 32.4  30.0 - 36.0 g/dL   RDW 91.4  78.2 - 95.6 %   Platelets 407 (*) 150 - 400 K/uL   Neutrophils Relative % 49  43 - 77 %   Lymphocytes Relative 40  12 - 46 %   Monocytes Relative 8  3 - 12 %   Eosinophils Relative 2  0 - 5  %   Basophils Relative 1  0 - 1 %   Neutro Abs 3.3  1.7 - 7.7 K/uL   Lymphs Abs 2.8  0.7 - 4.0 K/uL   Monocytes Absolute 0.6  0.1 - 1.0 K/uL   Eosinophils Absolute 0.1  0.0 - 0.7 K/uL   Basophils Absolute 0.1  0.0 - 0.1 K/uL   RBC Morphology STOMATOCYTES     WBC Morphology ATYPICAL LYMPHOCYTES    LACTIC ACID, PLASMA     Status: None   Collection Time    09/11/13  9:45 PM      Result Value Ref Range   Lactic Acid, Venous 0.9  0.5 - 2.2 mmol/L  I-STAT CG4 LACTIC ACID, ED     Status: None   Collection Time    09/11/13 10:04 PM      Result Value Ref Range   Lactic Acid, Venous 0.85  0.5 - 2.2 mmol/L  CULTURE, BLOOD (ROUTINE X 2)     Status: None   Collection Time    09/11/13 10:45 PM      Result Value Ref Range   Specimen Description BLOOD LEFT HAND     Special Requests BOTTLES DRAWN AEROBIC ONLY 4CC     Culture NO GROWTH 1 DAY     Report Status PENDING    CBC     Status: Abnormal   Collection Time    09/12/13  6:33 AM      Result Value Ref Range   WBC 6.5  4.0 - 10.5 K/uL   RBC 3.58 (*) 3.87 - 5.11 MIL/uL   Hemoglobin 10.8 (*) 12.0 - 15.0 g/dL   HCT 40.933.6 (*) 81.136.0 - 91.446.0 %   MCV 93.9  78.0 - 100.0 fL   MCH 30.2  26.0 - 34.0 pg   MCHC 32.1  30.0 - 36.0 g/dL   RDW 78.213.6  95.611.5 - 21.315.5 %   Platelets 383  150 - 400 K/uL  COMPREHENSIVE METABOLIC PANEL     Status: Abnormal   Collection Time    09/12/13  6:33 AM      Result Value Ref Range   Sodium 141  137 - 147 mEq/L   Potassium 3.7  3.7 - 5.3 mEq/L   Chloride 104  96 - 112 mEq/L   CO2 25  19 - 32 mEq/L   Glucose, Bld 87  70 - 99 mg/dL   BUN 6  6 - 23 mg/dL   Creatinine, Ser 0.860.71  0.50 - 1.10 mg/dL   Calcium 9.0  8.4 - 57.810.5 mg/dL   Total Protein 7.0  6.0 - 8.3 g/dL   Albumin 3.1 (*) 3.5 - 5.2 g/dL   AST 21  0 - 37 U/L   ALT 23  0 - 35 U/L   Alkaline Phosphatase 63  39 - 117 U/L   Total Bilirubin 0.2 (*) 0.3 - 1.2 mg/dL   GFR calc non Af Amer >90  >90 mL/min   GFR calc Af Amer >90  >90 mL/min   Anion gap 12  5 - 15   MRSA PCR SCREENING     Status: None   Collection Time    09/12/13  2:00 PM      Result Value Ref Range   MRSA by PCR NEGATIVE  NEGATIVE  Recent Results (from the past 240 hour(s))  CULTURE, BLOOD (ROUTINE X 2)     Status: None   Collection Time    09/11/13  9:24 PM      Result Value Ref Range Status   Specimen Description BLOOD RIGHT ANTECUBITAL   Final   Special Requests BOTTLES DRAWN AEROBIC ONLY 4CC   Final   Culture NO GROWTH 1 DAY   Final   Report Status PENDING   Incomplete  CULTURE, BLOOD (ROUTINE X 2)     Status: None   Collection Time    09/11/13 10:45 PM      Result Value Ref Range Status   Specimen Description BLOOD LEFT HAND   Final   Special Requests BOTTLES DRAWN AEROBIC ONLY 4CC   Final   Culture NO GROWTH 1 DAY   Final   Report Status PENDING   Incomplete  MRSA PCR SCREENING     Status: None   Collection Time    09/12/13  2:00 PM      Result Value Ref Range Status   MRSA by PCR NEGATIVE  NEGATIVE Final   Comment:            The GeneXpert MRSA Assay (FDA     approved for NASAL specimens     only), is one component of a     comprehensive MRSA colonization     surveillance program. It is not     intended to diagnose MRSA     infection nor to guide or     monitor treatment for     MRSA infections.    Renal Function:  Recent Labs  09/11/13 2145 09/12/13 0633  CREATININE 0.78 0.71   Estimated Creatinine Clearance: 118.3 ml/min (by C-G formula based on Cr of 0.71).  Radiologic Imaging: Ct Abdomen Pelvis Wo Contrast  09/12/2013   CLINICAL DATA:  Flank pain. Question complication, including abscess  EXAM: CT ABDOMEN AND PELVIS WITHOUT CONTRAST  TECHNIQUE: Multidetector CT imaging of the abdomen and pelvis was performed following the standard protocol without IV contrast.  COMPARISON:  09/10/2013  FINDINGS: BODY WALL: Unremarkable.  LOWER CHEST: Mild basilar atelectasis.  ABDOMEN/PELVIS:  Liver: No focal abnormality.  Biliary: Cholecystectomy.  Pancreas:  Unremarkable.  Spleen: Unremarkable.  Adrenals: Unremarkable.  Kidneys and ureters: An internal ureteral stent on the right remains in unchanged position. 5 mm calculus associated with the distal stent is unchanged, likely within the ureteral vesicular junction. Bilateral nephrolithiasis, 3 mm in the left lower pole and 4 mm in the right lower pole. There is mild asymmetric perinephric edema on the right. No hydronephrosis. Noncontrast CT limited for detecting abscess, as clinically questioned.  Bladder: Mild wall thickening circumferentially, accentuated by under distention.  Reproductive: Unremarkable.  Bowel: No obstruction.  Retroperitoneum: No mass or adenopathy.  Peritoneum: No ascites or pneumoperitoneum.  Vascular: No acute abnormality.  OSSEOUS: No acute abnormalities.  IMPRESSION: 1. Unremarkable positioning of a right ureteral stent. No hydronephrosis. 2. Unchanged positioning of 5 mm stone near the right UVJ. 3. Bilateral nephrolithiasis.   Electronically Signed   By: Tiburcio Pea M.D.   On: 09/12/2013 01:08   Dg Abd 1 View  09/11/2013   CLINICAL DATA:  Flank pain. Right ureteral stent placement 09/07/2013  EXAM: ABDOMEN - 1 VIEW  COMPARISON:  09/08/2013  FINDINGS: Unchanged orientation of an internal right ureteral stent. Upper and lower limbs are redundant but fully formed. A 5 mm stone continues to project at the level of the  lower limb, at the ureteral vesicular junction based on interval CT.  Intrarenal calculi are better discerned on preceding CT. There is a large volume of formed stool. No concerning intra-abdominal mass effect. Cholecystectomy.  IMPRESSION: 1. Stable positioning of a right ureteral stent. 2. 5 mm stone likely remains at the right UVJ when correlated with CT from yesterday. 3. Possible constipation.   Electronically Signed   By: Tiburcio Pea M.D.   On: 09/11/2013 22:40    I independently reviewed the above imaging studies.  Impression/Assessment:  1. Right ureteral  stone-initially presenting in the right ureteropelvic junction. This was. With a UTI and hydronephrosis. She has since been stented since her original presentation. From what I can see, the stone is now distal at the right ureterovesical junction.  2. Urinary tract infection/pyelonephrosis, improved with stent placement. She is growing Escherichia coli, properly treated with Cipro  3. MRSA positive blood cultures, on vancomycin  4. Right flank pain-this may well be from inflammatory pain from the infection. It could also be due to reflux of urine up the stent which might exacerbate her flank pain.  Plan:  1. I think it's okay for her to have a diet  2. I will add an anti-inflammatory to her painmanagement regimen.I think also to have early having the Dilaudid on a PCA would help  3. She has seen somebody in our office in [place] before. Once she leaves, she should make an appointment to followup next week for the eventual planning of cystoscopy, stent extraction, ureteroscopy.  4. I've reassured the patient about her current situation-she will continue to have some pain until the stent is out.

## 2013-09-12 NOTE — Progress Notes (Signed)
Pt left the floor via [facility] with PCA, O2 2L [place] by stretcher. Vital signs stable. No signs of distress.

## 2013-09-12 NOTE — Progress Notes (Signed)
UR review complete.  

## 2013-09-12 NOTE — H&P (Addendum)
PCP:   No PCP Per Patient   Chief Complaint:  Positive blood cultures  HPI: 28 year old female who   has a past medical history of Ovarian cyst; UTI (lower urinary tract infection); Asthma; Kidney stones; Calcium oxalate renal stones; and Chronic pelvic pain in female. Patient was seen at [facility] on 09/06/2013 with abdominal pain, at that time CT findings were consistent with pyelonephritis along with 5 mm stone on imaging with partial hydronephrosis. Patient was seen by urology and underwent cystogram and ureteral stent placement. Patient was discharged on Cipro urine culture showed Escherichia coli ESBL negative, sensitive to Cipro. Patient was discharged on Friday 3 days ago. Patient continued to have pain with vomiting and fever after discharge. She was contacted at home yesterday regarding positive blood cultures growing MRSA. Patient presented to [facility] yesterday and was started on IV antibiotic. Patient left AMA as she continued to have pain and vomiting and she said "they won't do anything about it". So today she came to the ED, for IV antibiotics. Patient continues to have right flank pain, CT abdomen pelvis was done which showed ureteral stent in place along with a 5 mm stone at the UV junction on right. Urologist was called by the ED physician. No urological procedure at this time.  Allergies:   Allergies  Allergen Reactions  . Iohexol Hives    ALLERGIC TO IVP DYE, HIVE, PT NEEDS PRE MEDS   . Zofran Nausea And Vomiting  . Ketorolac Tromethamine Rash  . Morphine And Related Rash      Past Medical History  Diagnosis Date  . Ovarian cyst   . UTI (lower urinary tract infection)   . Asthma   . Kidney stones   . Calcium oxalate renal stones   . Chronic pelvic pain in female     Past Surgical History  Procedure Laterality Date  . Ovarian cyst removal    . Lithotripsy    . Laparoscopic tubal ligation    . Cholecystectomy    . Appendectomy    . Tee  without cardioversion N/A 10/31/2012    Procedure: TRANSESOPHAGEAL ECHOCARDIOGRAM (TEE);  Surgeon: Laurey Moralealton S McLean, MD;  [facility];  Service: Cardiovascular;  Laterality: N/A;    Prior to Admission medications   Medication Sig Start Date End Date Taking? Authorizing Provider  ciprofloxacin (CIPRO) 500 MG tablet Take 1 tablet (500 mg total) by mouth 2 (two) times daily. 05/24/13  Yes Deatra CanterWilliam J Oxford, FNP  ibuprofen (ADVIL,MOTRIN) 800 MG tablet Take 800 mg by mouth daily as needed for pain. 11/14/11  Yes Kathie DikeHobson M Bryant, PA-C  QUEtiapine (SEROQUEL) 100 MG tablet Take 200 mg by mouth at bedtime.    Yes Historical Provider, MD    Social History:  reports that she has been smoking Cigarettes.  She has a 7 pack-year smoking history. She has never used smokeless tobacco. She reports that she does not drink alcohol or use illicit drugs.  Family History  Problem Relation Age of Onset  . Diabetes Other   . Seizures Other   . Cancer Other      All the positives are listed in BOLD  Review of Systems:  HEENT: Headache, blurred vision, runny nose, sore throat Neck: Hypothyroidism, hyperthyroidism,,lymphadenopathy Chest : Shortness of breath, history of COPD, Asthma Heart : Chest pain, history of coronary arterey disease GI:  Nausea, vomiting, diarrhea, constipation, GERD GU: Dysuria, urgency, frequency of urination, hematuria Neuro: Stroke, seizures, syncope Psych: Depression, anxiety, hallucinations  Physical Exam: Blood pressure 148/98, pulse 114, temperature 98.2 F (36.8 C), temperature source Oral, resp. rate 20, height 5\' 4"  (1.626 m), weight 98.431 kg (217 lb), last menstrual period 08/28/2013, SpO2 100.00%. Constitutional:   Patient is a well-developed and well-nourished female* in no acute distress and cooperative with exam. Head: Normocephalic and atraumatic Mouth: Mucus membranes moist Eyes: PERRL, EOMI, conjunctivae normal Neck: Supple, No  Thyromegaly Cardiovascular: RRR, S1 normal, S2 normal Pulmonary/Chest: CTAB, no wheezes, rales, or rhonchi Abdominal: Soft. Right CVA tenderness, non-distended, bowel sounds are normal, no masses, organomegaly, or guarding present.  Neurological: A&O x3, Strenght is normal and symmetric bilaterally, cranial nerve II-XII are grossly intact, no focal motor deficit, sensory intact to light touch bilaterally.  Extremities : No Cyanosis, Clubbing or Edema  Labs on Admission:  Basic Metabolic Panel:  Recent Labs Lab 09/11/13 2145  NA 139  K 4.2  CL 102  CO2 26  GLUCOSE 93  BUN 9  CREATININE 0.78  CALCIUM 9.3    CBC:  Recent Labs Lab 09/11/13 2145  WBC 6.9  NEUTROABS 3.3  HGB 11.3*  HCT 34.9*  MCV 94.6  PLT 407*    Radiological Exams on Admission: Ct Abdomen Pelvis Wo Contrast  09/12/2013   CLINICAL DATA:  Flank pain. Question complication, including abscess  EXAM: CT ABDOMEN AND PELVIS WITHOUT CONTRAST  TECHNIQUE: Multidetector CT imaging of the abdomen and pelvis was performed following the standard protocol without IV contrast.  COMPARISON:  09/10/2013  FINDINGS: BODY WALL: Unremarkable.  LOWER CHEST: Mild basilar atelectasis.  ABDOMEN/PELVIS:  Liver: No focal abnormality.  Biliary: Cholecystectomy.  Pancreas: Unremarkable.  Spleen: Unremarkable.  Adrenals: Unremarkable.  Kidneys and ureters: An internal ureteral stent on the right remains in unchanged position. 5 mm calculus associated with the distal stent is unchanged, likely within the ureteral vesicular junction. Bilateral nephrolithiasis, 3 mm in the left lower pole and 4 mm in the right lower pole. There is mild asymmetric perinephric edema on the right. No hydronephrosis. Noncontrast CT limited for detecting abscess, as clinically questioned.  Bladder: Mild wall thickening circumferentially, accentuated by under distention.  Reproductive: Unremarkable.  Bowel: No obstruction.  Retroperitoneum: No mass or adenopathy.   Peritoneum: No ascites or pneumoperitoneum.  Vascular: No acute abnormality.  OSSEOUS: No acute abnormalities.  IMPRESSION: 1. Unremarkable positioning of a right ureteral stent. No hydronephrosis. 2. Unchanged positioning of 5 mm stone near the right UVJ. 3. Bilateral nephrolithiasis.   Electronically Signed   By: Tiburcio Pea M.D.   On: 09/12/2013 01:08   Dg Abd 1 View  09/11/2013   CLINICAL DATA:  Flank pain. Right ureteral stent placement 09/07/2013  EXAM: ABDOMEN - 1 VIEW  COMPARISON:  09/08/2013  FINDINGS: Unchanged orientation of an internal right ureteral stent. Upper and lower limbs are redundant but fully formed. A 5 mm stone continues to project at the level of the lower limb, at the ureteral vesicular junction based on interval CT.  Intrarenal calculi are better discerned on preceding CT. There is a large volume of formed stool. No concerning intra-abdominal mass effect. Cholecystectomy.  IMPRESSION: 1. Stable positioning of a right ureteral stent. 2. 5 mm stone likely remains at the right UVJ when correlated with CT from yesterday. 3. Possible constipation.   Electronically Signed   By: Tiburcio Pea M.D.   On: 09/11/2013 22:40       Assessment/Plan Principal Problem:   MRSA bacteremia Active Problems:   Nephrolithiasis   Nausea   UTI (lower  urinary tract infection)   Flank pain  MRSA bacteremia Patient had MRSA bacteremia, repeat blood cultures have been obtained in the ED. Start the patient on vancomycin per pharmacy consultation. May need ID evaluation in a.m. for further recommendations.  UTI Patient was recently diagnosed with UTI, urine culture growing Escherichia coli. Which is sensitive to Cipro, we'll continue with by mouth Cipro at this time patient has normal white count in the hospital and has been afebrile.  Nausea vomiting We'll start Phenergan 12.5 IV every 6 hours when necessary  Abdominal pain Secondary to ureteral stone We'll give Dilaudid when  necessary for pain  Code status: Patient is full code  Family discussion: No family at bedside   Time Spent on Admission: 65 minutes  Americus Perkey S [facility] Pager: [phone] 09/12/2013, 2:13 AM  If 7PM-7AM, please contact night-coverage  [email or web address]  Password [email or web address]

## 2013-09-12 NOTE — Progress Notes (Signed)
ANTIBIOTIC CONSULT NOTE - INITIAL  Pharmacy Consult for vancomycin Indication: bacteremia  Allergies  Allergen Reactions  . Iohexol Hives    ALLERGIC TO IVP DYE, HIVE, PT NEEDS PRE MEDS   . Zofran Nausea And Vomiting  . Ketorolac Tromethamine Rash  . Morphine And Related Rash    Patient Measurements: Height: 5\' 4"  (162.6 cm) Weight: 217 lb (98.431 kg) IBW/kg (Calculated) : 54.7 Adjusted Body Weight: 69 kg  Vital Signs: Temp: 98.2 F (36.8 C) (08/03 2021) Temp src: Oral (08/03 2021) BP: 148/98 mmHg (08/04 0040) Pulse Rate: 114 (08/04 0040) Intake/Output from previous day:   Intake/Output from this shift:    Labs:  Recent Labs  09/11/13 2145  WBC 6.9  HGB 11.3*  PLT 407*  CREATININE 0.78   Estimated Creatinine Clearance: 118.3 ml/min (by C-G formula based on Cr of 0.78). No results found for this basename: VANCOTROUGH, VANCOPEAK, VANCORANDOM, GENTTROUGH, GENTPEAK, GENTRANDOM, TOBRATROUGH, TOBRAPEAK, TOBRARND, AMIKACINPEAK, AMIKACINTROU, AMIKACIN,  in the last 72 hours   Microbiology: Recent Results (from the past 720 hour(s))  CULTURE, BLOOD (ROUTINE X 2)     Status: None   Collection Time    09/11/13  9:24 PM      Result Value Ref Range Status   Specimen Description BLOOD RIGHT ANTECUBITAL   Final   Special Requests BOTTLES DRAWN AEROBIC ONLY 4CC   Final   Culture PENDING   Incomplete   Report Status PENDING   Incomplete  CULTURE, BLOOD (ROUTINE X 2)     Status: None   Collection Time    09/11/13 10:45 PM      Result Value Ref Range Status   Specimen Description BLOOD LEFT HAND   Final   Special Requests BOTTLES DRAWN AEROBIC ONLY 4CC   Final   Culture PENDING   Incomplete   Report Status PENDING   Incomplete    Medical History: Past Medical History  Diagnosis Date  . Ovarian cyst   . UTI (lower urinary tract infection)   . Asthma   . Kidney stones   . Calcium oxalate renal stones   . Chronic pelvic pain in female     Medications:   Scheduled:   Infusions:  . vancomycin 1,000 mg (09/12/13 0037)   PRN:   Assessment: [age]yr female with hx of renal stones and placement of ureteral stent approx 1 week ago.  Assuming she was treated at that time for pan-sensitive E.coli UTI as well.  Patient now told she has MRSA bacteremia to be tx'd with vancomycin.  Blood and urine cultures re-done last night.  Goal of Therapy:  For MRSA bacteremia, desire vancomycin trough serum level to be 15-6620mcg/ml  Plan:  1.  Start vancomycin 1gm IV q8h 2.  Monitor indices for infection and renal function 3. Measure steady state vancomycin serum level as clinically appropriate  Kylene Zamarron E 09/12/2013,1:19 AM

## 2013-09-13 DIAGNOSIS — I517 Cardiomegaly: Secondary | ICD-10-CM

## 2013-09-13 LAB — URINE CULTURE
COLONY COUNT: NO GROWTH
Culture: NO GROWTH

## 2013-09-13 LAB — RAPID URINE DRUG SCREEN, HOSP PERFORMED
Amphetamines: NOT DETECTED
BENZODIAZEPINES: NOT DETECTED
Barbiturates: NOT DETECTED
COCAINE: NOT DETECTED
OPIATES: POSITIVE — AB
Tetrahydrocannabinol: NOT DETECTED

## 2013-09-13 LAB — VANCOMYCIN, TROUGH: VANCOMYCIN TR: 17.2 ug/mL (ref 10.0–20.0)

## 2013-09-13 MED ORDER — CIPROFLOXACIN IN D5W 400 MG/200ML IV SOLN
400.0000 mg | Freq: Two times a day (BID) | INTRAVENOUS | Status: DC
  Filled 2013-09-13: qty 200

## 2013-09-13 NOTE — Progress Notes (Signed)
PHARMACY BRIEF NOTE - Drug Level Result  Consult for:  IV Vancomycin Indication:  MRSA bacteremia   The current dose of Vancomycin is 1000 mg IV every 8 hours.  The trough level drawn 1 hour prior to the 6 pm dose today is reported as 17.2 mcg/ml.  This dose is within the therapeutic range, 15-20 mcg/ml.  Plan:   Continue Vancomycin at the current dose.  Polo Rileylaire Geselle Cardosa R.Ph. 09/13/2013 6:49 PM

## 2013-09-13 NOTE — Consult Note (Addendum)
[facility] for Infectious Disease    Date of Admission:  09/11/2013  Date of Consult:  09/13/2013  Reason for Consult: MRSA BACTEREMIA AND MRSA + E COLI IN URINE W STENT Referring Physician: Dr. Roderic Palau   HPI: [name] is an 28 y.o. female with past medical history significant for kidney stones, stent placement At this past fall in September of 2014. At that time she had a visit in the emergency department and had MRSA isolated from urine cultures and there was concern for possibility of MRSA bacteremia. We arrange for hospital admission back in September and check blood cultures which were negative we arranged for transesophageal echocardiogram to rule out endocarditis and this was also negative we feel with intravenous antibiotics in the form of vancomycin while she was in the hospital and chills when discharged home on Bactrim.  Approximate on July 25 patient had experienced flank pain with radiation into her groin and she is concerned she is developing a kidney stone she was indeed having hematuria. She sought care at [facility] on July 30. On admission urine cultures were obtained as well as blood cultures. Her urine culture did grow Escherichia coli that was sensitive to all antibiotics against which it was tested as well as MRSA.   MRSA was resistant to tetracycline clindamycin fluoroquinolones but sensitive to vancomycin Bactrim and the zyvox  She was apparently discharged to home on ciprofloxacin, but without coverage for the MRSA that was in the urine. Indeed this time she was found to have MRSA in both of her admission blood cultures. She was called back to [facility] and readmitted. She is placed back on intravenous antibiotics. Apparently she then became upset and left AGAINST MEDICAL ADVICE. I called [facility] and cultures for [facility] from August 2 show a negative urine culture without any growth and show blood cultures from the second have  no growth to date.  The timing of these cultures with regards to the antibiotics that she may have received.  She then came back into care at [facility] and was placed on vancomycin and ciprofloxacin. Her urine cultures done at [facility] show no growth. Blood cultures so far show no growth.  Given the fact that she had severe bacteremia with MRSA with the source thought to be possibly her urine where she has a stent placed I felt she should be transferred to a center where urologist remove this likely infected stents.  Therefore she was sent oveR to [place] along for further care and evaluation by urology. Her 2-D echocardiogram done here shows no evidence of vegetations.   Past Medical History  Diagnosis Date  . Ovarian cyst   . UTI (lower urinary tract infection)   . Asthma   . Kidney stones   . Calcium oxalate renal stones   . Chronic pelvic pain in female     Past Surgical History  Procedure Laterality Date  . Ovarian cyst removal    . Lithotripsy    . Laparoscopic tubal ligation    . Cholecystectomy    . Appendectomy    . Tee without cardioversion N/A 10/31/2012    Procedure: TRANSESOPHAGEAL ECHOCARDIOGRAM (TEE);  Surgeon: Larey Dresser, MD;  Location: [place] [facility];  Service: Cardiovascular;  Laterality: N/A;  ergies:   Allergies  Allergen Reactions  . Iohexol Hives    ALLERGIC TO IVP DYE, HIVE, PT NEEDS PRE MEDS   . Zofran Nausea And Vomiting  . Ketorolac Tromethamine  Rash  . Morphine And Related Rash     Medications: I have reviewed patients current medications as documented in Epic Anti-infectives   Start     Dose/Rate Route Frequency Ordered Stop   09/13/13 1600  ciprofloxacin (CIPRO) IVPB 400 mg  Status:  Discontinued     400 mg 200 mL/hr over 60 Minutes Intravenous Every 12 hours 09/13/13 1436 09/13/13 1501   09/12/13 1000  vancomycin (VANCOCIN) IVPB 1000 mg/200 mL premix     1,000 mg 200 mL/hr over 60 Minutes Intravenous Every 8 hours 09/12/13 0326      09/12/13 0800  ciprofloxacin (CIPRO) tablet 500 mg  Status:  Discontinued     500 mg Oral Every 12 hours 09/12/13 0314 09/12/13 1715   09/12/13 0000  vancomycin (VANCOCIN) IVPB 1000 mg/200 mL premix  Status:  Discontinued     1,000 mg 200 mL/hr over 60 Minutes Intravenous Every 8 hours 09/11/13 2341 09/12/13 0314      Social History:  reports that she has been smoking Cigarettes.  She has a 7 pack-year smoking history. She has never used smokeless tobacco. She reports that she does not drink alcohol or use illicit drugs.  Family History  Problem Relation Age of Onset  . Diabetes Other   . Seizures Other   . Cancer Other     As in HPI and primary teams notes otherwise 12 point review of systems is negative  Blood pressure 150/91, pulse 82, temperature 98.2 F (36.8 C), temperature source Oral, resp. rate 14, height 5' 4"  (1.626 m), weight 217 lb (98.431 kg), last menstrual period 08/28/2013, SpO2 98.00%.  General: Alert and awake, oriented x3, anxious, dysphoric HEENT: anicteric sclera,EOMI, oropharynx clear and without exudate CVS regular rate, normal r,  no murmur rubs or gallops Chest: clear to auscultation bilaterally, no wheezing, rales or rhonchi Abdomen: soft nontender, nondistended, normal bowel sounds, Extremities: no  clubbing or edema noted bilaterally Skin: no rashes Neuro: nonfocal, strength and sensation intact   Results for orders placed during the hospital encounter of 09/11/13 (from the past 48 hour(s))  PREGNANCY, URINE     Status: None   Collection Time    09/11/13  9:02 PM      Result Value Ref Range   Preg Test, Ur NEGATIVE  NEGATIVE   Comment:            THE SENSITIVITY OF THIS     METHODOLOGY IS >20 mIU/mL.  URINE CULTURE     Status: None   Collection Time    09/11/13  9:02 PM      Result Value Ref Range   Specimen Description URINE, CLEAN CATCH     Special Requests NONE     Culture  Setup Time       Value: 09/12/2013 13:44     Performed at  [facility] Count       Value: NO GROWTH     Performed at [facility]   Culture       Value: NO GROWTH     Performed at [facility]   Report Status 09/13/2013 FINAL    URINALYSIS, ROUTINE W REFLEX MICROSCOPIC     Status: Abnormal   Collection Time    09/11/13  9:02 PM      Result Value Ref Range   Color, Urine YELLOW  YELLOW   APPearance CLEAR  CLEAR   Specific Gravity, Urine 1.015  1.005 - 1.030   pH  6.0  5.0 - 8.0   Glucose, UA NEGATIVE  NEGATIVE mg/dL   Hgb urine dipstick SMALL (*) NEGATIVE   Bilirubin Urine NEGATIVE  NEGATIVE   Ketones, ur NEGATIVE  NEGATIVE mg/dL   Protein, ur NEGATIVE  NEGATIVE mg/dL   Urobilinogen, UA 0.2  0.0 - 1.0 mg/dL   Nitrite NEGATIVE  NEGATIVE   Leukocytes, UA NEGATIVE  NEGATIVE  URINE MICROSCOPIC-ADD ON     Status: Abnormal   Collection Time    09/11/13  9:02 PM      Result Value Ref Range   Squamous Epithelial / LPF FEW (*) RARE   WBC, UA 0-2  <3 WBC/hpf   RBC / HPF 7-10  <3 RBC/hpf   Bacteria, UA RARE  RARE  CULTURE, BLOOD (ROUTINE X 2)     Status: None   Collection Time    09/11/13  9:24 PM      Result Value Ref Range   Specimen Description BLOOD RIGHT ANTECUBITAL     Special Requests BOTTLES DRAWN AEROBIC ONLY 4CC     Culture NO GROWTH 2 DAYS     Report Status PENDING    BASIC METABOLIC PANEL     Status: None   Collection Time    09/11/13  9:45 PM      Result Value Ref Range   Sodium 139  137 - 147 mEq/L   Potassium 4.2  3.7 - 5.3 mEq/L   Chloride 102  96 - 112 mEq/L   CO2 26  19 - 32 mEq/L   Glucose, Bld 93  70 - 99 mg/dL   BUN 9  6 - 23 mg/dL   Creatinine, Ser 0.78  0.50 - 1.10 mg/dL   Calcium 9.3  8.4 - 10.5 mg/dL   GFR calc non Af Amer >90  >90 mL/min   GFR calc Af Amer >90  >90 mL/min   Comment: (NOTE)     The eGFR has been calculated using the CKD EPI equation.     This calculation has not been validated in all clinical situations.     eGFR's persistently <90 mL/min signify possible  Chronic Kidney     Disease.   Anion gap 11  5 - 15  CBC WITH DIFFERENTIAL     Status: Abnormal   Collection Time    09/11/13  9:45 PM      Result Value Ref Range   WBC 6.9  4.0 - 10.5 K/uL   RBC 3.69 (*) 3.87 - 5.11 MIL/uL   Hemoglobin 11.3 (*) 12.0 - 15.0 g/dL   HCT 34.9 (*) 36.0 - 46.0 %   MCV 94.6  78.0 - 100.0 fL   MCH 30.6  26.0 - 34.0 pg   MCHC 32.4  30.0 - 36.0 g/dL   RDW 13.6  11.5 - 15.5 %   Platelets 407 (*) 150 - 400 K/uL   Neutrophils Relative % 49  43 - 77 %   Lymphocytes Relative 40  12 - 46 %   Monocytes Relative 8  3 - 12 %   Eosinophils Relative 2  0 - 5 %   Basophils Relative 1  0 - 1 %   Neutro Abs 3.3  1.7 - 7.7 K/uL   Lymphs Abs 2.8  0.7 - 4.0 K/uL   Monocytes Absolute 0.6  0.1 - 1.0 K/uL   Eosinophils Absolute 0.1  0.0 - 0.7 K/uL   Basophils Absolute 0.1  0.0 - 0.1 K/uL   RBC Morphology STOMATOCYTES  WBC Morphology ATYPICAL LYMPHOCYTES    LACTIC ACID, PLASMA     Status: None   Collection Time    09/11/13  9:45 PM      Result Value Ref Range   Lactic Acid, Venous 0.9  0.5 - 2.2 mmol/L  I-STAT CG4 LACTIC ACID, ED     Status: None   Collection Time    09/11/13 10:04 PM      Result Value Ref Range   Lactic Acid, Venous 0.85  0.5 - 2.2 mmol/L  CULTURE, BLOOD (ROUTINE X 2)     Status: None   Collection Time    09/11/13 10:45 PM      Result Value Ref Range   Specimen Description BLOOD LEFT HAND     Special Requests BOTTLES DRAWN AEROBIC ONLY 4CC     Culture NO GROWTH 2 DAYS     Report Status PENDING    CBC     Status: Abnormal   Collection Time    09/12/13  6:33 AM      Result Value Ref Range   WBC 6.5  4.0 - 10.5 K/uL   RBC 3.58 (*) 3.87 - 5.11 MIL/uL   Hemoglobin 10.8 (*) 12.0 - 15.0 g/dL   HCT 33.6 (*) 36.0 - 46.0 %   MCV 93.9  78.0 - 100.0 fL   MCH 30.2  26.0 - 34.0 pg   MCHC 32.1  30.0 - 36.0 g/dL   RDW 13.6  11.5 - 15.5 %   Platelets 383  150 - 400 K/uL  COMPREHENSIVE METABOLIC PANEL     Status: Abnormal   Collection Time     09/12/13  6:33 AM      Result Value Ref Range   Sodium 141  137 - 147 mEq/L   Potassium 3.7  3.7 - 5.3 mEq/L   Chloride 104  96 - 112 mEq/L   CO2 25  19 - 32 mEq/L   Glucose, Bld 87  70 - 99 mg/dL   BUN 6  6 - 23 mg/dL   Creatinine, Ser 0.71  0.50 - 1.10 mg/dL   Calcium 9.0  8.4 - 10.5 mg/dL   Total Protein 7.0  6.0 - 8.3 g/dL   Albumin 3.1 (*) 3.5 - 5.2 g/dL   AST 21  0 - 37 U/L   ALT 23  0 - 35 U/L   Alkaline Phosphatase 63  39 - 117 U/L   Total Bilirubin 0.2 (*) 0.3 - 1.2 mg/dL   GFR calc non Af Amer >90  >90 mL/min   GFR calc Af Amer >90  >90 mL/min   Comment: (NOTE)     The eGFR has been calculated using the CKD EPI equation.     This calculation has not been validated in all clinical situations.     eGFR's persistently <90 mL/min signify possible Chronic Kidney     Disease.   Anion gap 12  5 - 15  MRSA PCR SCREENING     Status: None   Collection Time    09/12/13  2:00 PM      Result Value Ref Range   MRSA by PCR NEGATIVE  NEGATIVE   Comment:            The GeneXpert MRSA Assay (FDA     approved for NASAL specimens     only), is one component of a     comprehensive MRSA colonization     surveillance program. It is not  intended to diagnose MRSA     infection nor to guide or     monitor treatment for     MRSA infections.  CULTURE, BLOOD (ROUTINE X 2)     Status: None   Collection Time    09/12/13  5:56 PM      Result Value Ref Range   Specimen Description BLOOD RIGHT HAND     Special Requests BOTTLES DRAWN AEROBIC AND ANAEROBIC 10CC     Culture NO GROWTH 1 DAY     Report Status PENDING     @BRIEFLABTABLE (sdes,specrequest,cult,reptstatus)   ) Recent Results (from the past 720 hour(s))  URINE CULTURE     Status: None   Collection Time    09/11/13  9:02 PM      Result Value Ref Range Status   Specimen Description URINE, CLEAN CATCH   Final   Special Requests NONE   Final   Culture  Setup Time     Final   Value: 09/12/2013 13:44     Performed at [place]     Final   Value: NO GROWTH     Performed at [facility]   Culture     Final   Value: NO GROWTH     Performed at [facility]   Report Status 09/13/2013 FINAL   Final  CULTURE, BLOOD (ROUTINE X 2)     Status: None   Collection Time    09/11/13  9:24 PM      Result Value Ref Range Status   Specimen Description BLOOD RIGHT ANTECUBITAL   Final   Special Requests BOTTLES DRAWN AEROBIC ONLY 4CC   Final   Culture NO GROWTH 2 DAYS   Final   Report Status PENDING   Incomplete  CULTURE, BLOOD (ROUTINE X 2)     Status: None   Collection Time    09/11/13 10:45 PM      Result Value Ref Range Status   Specimen Description BLOOD LEFT HAND   Final   Special Requests BOTTLES DRAWN AEROBIC ONLY 4CC   Final   Culture NO GROWTH 2 DAYS   Final   Report Status PENDING   Incomplete  MRSA PCR SCREENING     Status: None   Collection Time    09/12/13  2:00 PM      Result Value Ref Range Status   MRSA by PCR NEGATIVE  NEGATIVE Final   Comment:            The GeneXpert MRSA Assay (FDA     approved for NASAL specimens     only), is one component of a     comprehensive MRSA colonization     surveillance program. It is not     intended to diagnose MRSA     infection nor to guide or     monitor treatment for     MRSA infections.  CULTURE, BLOOD (ROUTINE X 2)     Status: None   Collection Time    09/12/13  5:56 PM      Result Value Ref Range Status   Specimen Description BLOOD RIGHT HAND   Final   Special Requests BOTTLES DRAWN AEROBIC AND ANAEROBIC 10CC   Final   Culture NO GROWTH 1 DAY   Final   Report Status PENDING   Incomplete     Impression/Recommendation  Principal Problem:   MRSA bacteremia Active Problems:   Nephrolithiasis   Nausea  Flank pain   Shavaughn L Banes is a 28 y.o. female with  MRSA bacteremia, likely from urine as source with stent as nidus for persistent infection. TYpically we do not think of MRSA as a "urnary pathogen"  but in this case it may be. Her TTE has NOT ruled out endocarditis and I am concerned she may indeed be at high risk for this esp given that she did not receive effective antibiotics against MRSA when she was in the hospital at [facility] the first time nor she discharged on program buttocks. Then she left AGAINST MEDICAL ADVICE. When she has had multiple days of unopposed MRSA bacteremia which is a high risk factor for endocarditis and actually is part of the Dukes criteria if it is persistently positive.  #1 MRSA bacteremia with MRSA also found in the urine which has dense:  --Continue vancomycin goal trough of 15-20 --Do not yet place central lines is a PICC line.  --Try to exclude others potential sources or amount metastatic sites of infection. She does have fairly severe back pain which expect this from her bladder infection. However it maybe prudent to get an MRI of the lumbar spine to exclude discitis.  In the absence of exclusion of endocarditis I would be inclined to give this patient at least 4 weeks if not 6 weeks of systemic IV antibiotics. At times she has had unopposed bacteremia is concerning that she is at high risk for having endocarditis and certainly even with a negative TEE we cannot entirely exclude this diagnosis.  In the past there was concern for pain patient seeking behavior and poor the patient herself denies any history of IV drug use  This would make me concerned about the risk of her being at home with a PICC line but we can discuss this further. Another option would be to give her a long-acting IV.drugs such ORITOVANCIN vs oral zyvox       [facility] Staphylococcus aureus bacteremia   Staphylococcus aureus bacteremia (SAB) is associated with a high rate of complications and mortality.  Specific aspects of clinical management are critical to optimizing the outcome of patients with SAB.  Therefore, the [facility] Antimicrobial Management  Team [facility]) has initiated an intervention aimed at improving the management of SAB at [facility].  To do so, Infectious Diseases physicians are providing an evidence-based consult for the management of all patients with SAB.     Yes No Comments  Perform follow-up blood cultures (even if the patient is afebrile) to ensure clearance of bacteremia [x]  []    Remove vascular catheter and obtain follow-up blood cultures after the removal of the catheter []  []  DO NOT PLACE PICC LINE AT THIS POINT  Perform echocardiography to evaluate for endocarditis (transthoracic ECHO is 40-50% sensitive, TEE is > 90% sensitive) [x]  []  Please keep in mind, that neither test can definitively EXCLUDE endocarditis, and that should clinical suspicion remain high for endocarditis the patient should then still be treated with an "endocarditis" duration of therapy = 6 weeks  Consult electrophysiologist to evaluate implanted cardiac device (pacemaker, ICD) []  []  NA  Ensure source control []  [x]  Have all abscesses been drained effectively? Have deep seeded infections (septic joints or osteomyelitis) had appropriate surgical debridement?  STENT NEEDS TO COME OUT  Investigate for "metastatic" sites of infection []  []  Does the patient have ANY symptom or physical exam finding that would suggest a deeper infection (back or neck pain that may be suggestive of  vertebral osteomyelitis or epidural abscess, muscle pain that could be a symptom of pyomyositis)?  Keep in mind that for deep seeded infections MRI imaging with contrast is preferred rather than other often insensitive tests such as plain x-rays, especially early in a patient's presentation.  CONSIDER MRI L SPINE  Change antibiotic therapy to vancomycin [x]  []  Beta-lactam antibiotics are preferred for MSSA due to higher cure rates.   If on Vancomycin, goal trough should be 15 - 20 mcg/mL  Estimated duration of IV antibiotic therapy:  4-6 weeks []  []  Consult case management for  probably prolonged outpatient IV antibiotic therapy   I spent greater than 40 minutes with the patient including greater than 50% of time in face to face counsel of the patient and in coordination of their care.   #2 E coli also found in urine on 09/07/13: not recovered since then and patient DID receive antibiotics in form of cipro as inpatient and as an outpatient with an effective against this organism I see no reason to treat it anymore.  #3 screening recheck for HIV she was seronegative this fall  #4 ? Narcotic seeking behavior: kidney stones certainly painful so could be wrong in thinking this. Will tox screen urine (understanidng she is on narcotics now)  09/13/2013, 3:01 PM   Thank you so much for this interesting consult  [place] [facility] [place] [phone] (pager) [phone] (office) 09/13/2013, 3:01 PM  Rhina Br[place] 09/13/2013, 3:01 PM

## 2013-09-13 NOTE — Progress Notes (Signed)
Echocardiogram 2D Echocardiogram has been performed.  [name] 09/13/2013, 9:15 AM

## 2013-09-13 NOTE — Progress Notes (Signed)
ANTIBIOTIC CONSULT NOTE - INITIAL  Pharmacy Consult for Cipro Indication: UTI  Allergies  Allergen Reactions  . Iohexol Hives    ALLERGIC TO IVP DYE, HIVE, PT NEEDS PRE MEDS   . Zofran Nausea And Vomiting  . Ketorolac Tromethamine Rash  . Morphine And Related Rash    Patient Measurements: Height: 5\' 4"  (162.6 cm) Weight: 217 lb (98.431 kg) IBW/kg (Calculated) : 54.7   Vital Signs: Temp: 98.2 F (36.8 C) (08/05 1327) Temp src: Oral (08/05 1327) BP: 150/91 mmHg (08/05 1327) Pulse Rate: 82 (08/05 1327) Intake/Output from previous day: 08/04 0701 - 08/05 0700 In: 840 [P.O.:240; IV Piggyback:600] Out: 675 [Urine:675] Intake/Output from this shift: Total I/O In: 800 [P.O.:600; IV Piggyback:200] Out: 1100 [Urine:1100]  Labs:  Recent Labs  09/11/13 2145 09/12/13 0633  WBC 6.9 6.5  HGB 11.3* 10.8*  PLT 407* 383  CREATININE 0.78 0.71   Estimated Creatinine Clearance: 118.3 ml/min (by C-G formula based on Cr of 0.71). No results found for this basename: VANCOTROUGH, Leodis Binet, VANCORANDOM, GENTTROUGH, GENTPEAK, GENTRANDOM, TOBRATROUGH, TOBRAPEAK, TOBRARND, AMIKACINPEAK, AMIKACINTROU, AMIKACIN,  in the last 72 hours   Microbiology: Recent Results (from the past 720 hour(s))  URINE CULTURE     Status: None   Collection Time    09/11/13  9:02 PM      Result Value Ref Range Status   Specimen Description URINE, CLEAN CATCH   Final   Special Requests NONE   Final   Culture  Setup Time     Final   Value: 09/12/2013 13:44     Performed at [facility] Count     Final   Value: NO GROWTH     Performed at [facility]   Culture     Final   Value: NO GROWTH     Performed at [facility]   Report Status 09/13/2013 FINAL   Final  CULTURE, BLOOD (ROUTINE X 2)     Status: None   Collection Time    09/11/13  9:24 PM      Result Value Ref Range Status   Specimen Description BLOOD RIGHT ANTECUBITAL   Final   Special Requests BOTTLES DRAWN  AEROBIC ONLY 4CC   Final   Culture NO GROWTH 2 DAYS   Final   Report Status PENDING   Incomplete  CULTURE, BLOOD (ROUTINE X 2)     Status: None   Collection Time    09/11/13 10:45 PM      Result Value Ref Range Status   Specimen Description BLOOD LEFT HAND   Final   Special Requests BOTTLES DRAWN AEROBIC ONLY 4CC   Final   Culture NO GROWTH 2 DAYS   Final   Report Status PENDING   Incomplete  MRSA PCR SCREENING     Status: None   Collection Time    09/12/13  2:00 PM      Result Value Ref Range Status   MRSA by PCR NEGATIVE  NEGATIVE Final   Comment:            The GeneXpert MRSA Assay (FDA     approved for NASAL specimens     only), is one component of a     comprehensive MRSA colonization     surveillance program. It is not     intended to diagnose MRSA     infection nor to guide or     monitor treatment for     MRSA infections.  CULTURE,  BLOOD (ROUTINE X 2)     Status: None   Collection Time    09/12/13  5:56 PM      Result Value Ref Range Status   Specimen Description BLOOD RIGHT HAND   Final   Special Requests BOTTLES DRAWN AEROBIC AND ANAEROBIC 10CC   Final   Culture NO GROWTH 1 DAY   Final   Report Status PENDING   Incomplete    Anti-infectives   Start     Dose/Rate Route Frequency Ordered Stop   09/12/13 1000  vancomycin (VANCOCIN) IVPB 1000 mg/200 mL premix     1,000 mg 200 mL/hr over 60 Minutes Intravenous Every 8 hours 09/12/13 0326     09/12/13 0800  ciprofloxacin (CIPRO) tablet 500 mg  Status:  Discontinued     500 mg Oral Every 12 hours 09/12/13 0314 09/12/13 1715   09/12/13 0000  vancomycin (VANCOCIN) IVPB 1000 mg/200 mL premix  Status:  Discontinued     1,000 mg 200 mL/hr over 60 Minutes Intravenous Every 8 hours 09/11/13 2341 09/12/13 0314      Assessment: [age]yr female with hx of renal stones and placement of ureteral stent approx 1 week ago. [facility] with right-sided abdominal pain. Found to have kidney stones w/  pyelonephritis  & hydronephrosis on R side. Urine culture at that time + for e.coli (pansensitive). Then informed she had positive blood culture for MRSA after discharge. Returned to [facility] for further treatment, [facility] Pharmacy consulted to dose Cipro for UTI.   8/4 >>Vanco >> 8/5 >>Cipro >>  Tmax: afebrile WBCs: wnl Renal: wnl CrCl > 100  8/4 blood: sent 8/3 blood: ngtd 8/3 urine: sent  Goal of Therapy:  Appropriate antibiotic dosing for renal function; eradication of infection  Plan:   Start Cipro 400mg  IV Q12H  F/u culture results  Loma BostonLaura Roselynn Whitacre, PharmD Pager: [phone] 09/13/2013 2:33 PM

## 2013-09-13 NOTE — Progress Notes (Signed)
ANTIBIOTIC CONSULT NOTE - FOLLOW UP  Pharmacy Consult for Vancomycin Indication: bacteremia  Allergies  Allergen Reactions  . Iohexol Hives    ALLERGIC TO IVP DYE, HIVE, PT NEEDS PRE MEDS   . Zofran Nausea And Vomiting  . Ketorolac Tromethamine Rash  . Morphine And Related Rash    Patient Measurements: Height: 5\' 4"  (162.6 cm) Weight: 217 lb (98.431 kg) IBW/kg (Calculated) : 54.7   Vital Signs: Temp: 97.9 F (36.6 C) (08/05 0531) Temp src: Oral (08/05 0531) BP: 154/100 mmHg (08/05 0531) Pulse Rate: 62 (08/05 0531) Intake/Output from previous day: 08/04 0701 - 08/05 0700 In: 840 [P.O.:240; IV Piggyback:600] Out: 675 [Urine:675] Intake/Output from this shift: Total I/O In: 240 [P.O.:240] Out: -   Labs:  Recent Labs  09/11/13 2145 09/12/13 0633  WBC 6.9 6.5  HGB 11.3* 10.8*  PLT 407* 383  CREATININE 0.78 0.71   Estimated Creatinine Clearance: 118.3 ml/min (by C-G formula based on Cr of 0.71). No results found for this basename: VANCOTROUGH, Leodis BinetVANCOPEAK, VANCORANDOM, GENTTROUGH, GENTPEAK, GENTRANDOM, TOBRATROUGH, TOBRAPEAK, TOBRARND, AMIKACINPEAK, AMIKACINTROU, AMIKACIN,  in the last 72 hours   Microbiology: Recent Results (from the past 720 hour(s))  CULTURE, BLOOD (ROUTINE X 2)     Status: None   Collection Time    09/11/13  9:24 PM      Result Value Ref Range Status   Specimen Description BLOOD RIGHT ANTECUBITAL   Final   Special Requests BOTTLES DRAWN AEROBIC ONLY 4CC   Final   Culture NO GROWTH 1 DAY   Final   Report Status PENDING   Incomplete  CULTURE, BLOOD (ROUTINE X 2)     Status: None   Collection Time    09/11/13 10:45 PM      Result Value Ref Range Status   Specimen Description BLOOD LEFT HAND   Final   Special Requests BOTTLES DRAWN AEROBIC ONLY 4CC   Final   Culture NO GROWTH 1 DAY   Final   Report Status PENDING   Incomplete  MRSA PCR SCREENING     Status: None   Collection Time    09/12/13  2:00 PM      Result Value Ref Range Status   MRSA by PCR NEGATIVE  NEGATIVE Final   Comment:            The GeneXpert MRSA Assay (FDA     approved for NASAL specimens     only), is one component of a     comprehensive MRSA colonization     surveillance program. It is not     intended to diagnose MRSA     infection nor to guide or     monitor treatment for     MRSA infections.    Anti-infectives   Start     Dose/Rate Route Frequency Ordered Stop   09/12/13 1000  vancomycin (VANCOCIN) IVPB 1000 mg/200 mL premix     1,000 mg 200 mL/hr over 60 Minutes Intravenous Every 8 hours 09/12/13 0326     09/12/13 0800  ciprofloxacin (CIPRO) tablet 500 mg  Status:  Discontinued     500 mg Oral Every 12 hours 09/12/13 0314 09/12/13 1715   09/12/13 0000  vancomycin (VANCOCIN) IVPB 1000 mg/200 mL premix  Status:  Discontinued     1,000 mg 200 mL/hr over 60 Minutes Intravenous Every 8 hours 09/11/13 2341 09/12/13 0314      Assessment: [age]yr female with hx of renal stones and placement of ureteral stent approx 1 week  ago. Recently admitted to [facility] with right-sided abdominal pain. Found to have kidney stones w/ pyelonephritis  & hydronephrosis on R side. Urine culture at that time + for e.coli (pansensitive). Then informed she had positive blood culture for MRSA after discharge. Returned to [facility] for further treatment, subsequently transferred to [facility]. Pharmacy consulted to dose Vancomycin for bacteremia.   8/4 >>Vanco >>  Tmax: afebrile WBCs: wnl Renal: wnl CrCl > 100  8/4 blood: sent 8/3 blood: ngtd 8/3 urine: sent  Goal of Therapy:  Vancomycin trough level 15-20 mcg/ml Appropriate antibiotic dosing for renal function; eradication of infection  Plan:   Continue Vancomycin 1g IV Q8H  Obtain Vancomycin trough tonight at 1700  F/u culture results  Loma Boston, PharmD Pager: [phone] 09/13/2013 10:04 AM

## 2013-09-13 NOTE — Progress Notes (Signed)
[facility] PROGRESS NOTE  [name] XBL:[id number] DOB: 01-Jun-1985 DOA: 09/11/2013 PCP: No PCP Per Patient  Assessment/Plan:  Principal Problem:   MRSA bacteremia - ID on board.  - Pt on Vancomycin - Plans are to obtain further work up (echocardiogram etc. Please refer to orders by Dr. Daiva Eves  Active Problems:   Nephrolithiasis - Urology on board and managing. - Currently controlling pain with pca pump will defer pain management to urologist.   Code Status: full Family Communication: None at bedside Disposition Plan: Pending recommendations from specialist on board   Consultants:  ID  Urology  Procedures:  pending  Antibiotics:  Vancomycin   HPI/Subjective: Pt has no new complaints. No acute issues reported overnight. PCA pump ameliorating pain  Objective: Filed Vitals:   09/13/13 1327  BP: 150/91  Pulse: 82  Temp: 98.2 F (36.8 C)  Resp: 14    Intake/Output Summary (Last 24 hours) at 09/13/13 1419 Last data filed at 09/13/13 1328  Gross per 24 hour  Intake   1640 ml  Output   1775 ml  Net   -135 ml   Filed Weights   09/11/13 2021  Weight: 98.431 kg (217 lb)    Exam:   General:  Pt in nad, alert and awake  Cardiovascular: warm extremities, no edema  Respiratory: no increased wob, no wheezes  Abdomen: ND  Musculoskeletal: no cyanosis or clubbing   Data Reviewed: Basic Metabolic Panel:  Recent Labs Lab 09/11/13 2145 09/12/13 0633  NA 139 141  K 4.2 3.7  CL 102 104  CO2 26 25  GLUCOSE 93 87  BUN 9 6  CREATININE 0.78 0.71  CALCIUM 9.3 9.0   Liver Function Tests:  Recent Labs Lab 09/12/13 0633  AST 21  ALT 23  ALKPHOS 63  BILITOT 0.2*  PROT 7.0  ALBUMIN 3.1*   No results found for this basename: LIPASE, AMYLASE,  in the last 168 hours No results found for this basename: AMMONIA,  in the last 168 hours CBC:  Recent Labs Lab 09/11/13 2145 09/12/13 0633  WBC 6.9 6.5  NEUTROABS 3.3  --   HGB 11.3*  10.8*  HCT 34.9* 33.6*  MCV 94.6 93.9  PLT 407* 383   Cardiac Enzymes: No results found for this basename: CKTOTAL, CKMB, CKMBINDEX, TROPONINI,  in the last 168 hours BNP (last 3 results) No results found for this basename: PROBNP,  in the last 8760 hours CBG: No results found for this basename: GLUCAP,  in the last 168 hours  Recent Results (from the past 240 hour(s))  URINE CULTURE     Status: None   Collection Time    09/11/13  9:02 PM      Result Value Ref Range Status   Specimen Description URINE, CLEAN CATCH   Final   Special Requests NONE   Final   Culture  Setup Time     Final   Value: 09/12/2013 13:44     Performed at [facility] Count     Final   Value: NO GROWTH     Performed at [facility]   Culture     Final   Value: NO GROWTH     Performed at [facility]   Report Status 09/13/2013 FINAL   Final  CULTURE, BLOOD (ROUTINE X 2)     Status: None   Collection Time    09/11/13  9:24 PM      Result Value Ref  Range Status   Specimen Description BLOOD RIGHT ANTECUBITAL   Final   Special Requests BOTTLES DRAWN AEROBIC ONLY 4CC   Final   Culture NO GROWTH 2 DAYS   Final   Report Status PENDING   Incomplete  CULTURE, BLOOD (ROUTINE X 2)     Status: None   Collection Time    09/11/13 10:45 PM      Result Value Ref Range Status   Specimen Description BLOOD LEFT HAND   Final   Special Requests BOTTLES DRAWN AEROBIC ONLY 4CC   Final   Culture NO GROWTH 2 DAYS   Final   Report Status PENDING   Incomplete  MRSA PCR SCREENING     Status: None   Collection Time    09/12/13  2:00 PM      Result Value Ref Range Status   MRSA by PCR NEGATIVE  NEGATIVE Final   Comment:            The GeneXpert MRSA Assay (FDA     approved for NASAL specimens     only), is one component of a     comprehensive MRSA colonization     surveillance program. It is not     intended to diagnose MRSA     infection nor to guide or     monitor treatment for      MRSA infections.  CULTURE, BLOOD (ROUTINE X 2)     Status: None   Collection Time    09/12/13  5:56 PM      Result Value Ref Range Status   Specimen Description BLOOD RIGHT HAND   Final   Special Requests BOTTLES DRAWN AEROBIC AND ANAEROBIC 10CC   Final   Culture NO GROWTH 1 DAY   Final   Report Status PENDING   Incomplete     Studies: Ct Abdomen Pelvis Wo Contrast  09/12/2013   CLINICAL DATA:  Flank pain. Question complication, including abscess  EXAM: CT ABDOMEN AND PELVIS WITHOUT CONTRAST  TECHNIQUE: Multidetector CT imaging of the abdomen and pelvis was performed following the standard protocol without IV contrast.  COMPARISON:  09/10/2013  FINDINGS: BODY WALL: Unremarkable.  LOWER CHEST: Mild basilar atelectasis.  ABDOMEN/PELVIS:  Liver: No focal abnormality.  Biliary: Cholecystectomy.  Pancreas: Unremarkable.  Spleen: Unremarkable.  Adrenals: Unremarkable.  Kidneys and ureters: An internal ureteral stent on the right remains in unchanged position. 5 mm calculus associated with the distal stent is unchanged, likely within the ureteral vesicular junction. Bilateral nephrolithiasis, 3 mm in the left lower pole and 4 mm in the right lower pole. There is mild asymmetric perinephric edema on the right. No hydronephrosis. Noncontrast CT limited for detecting abscess, as clinically questioned.  Bladder: Mild wall thickening circumferentially, accentuated by under distention.  Reproductive: Unremarkable.  Bowel: No obstruction.  Retroperitoneum: No mass or adenopathy.  Peritoneum: No ascites or pneumoperitoneum.  Vascular: No acute abnormality.  OSSEOUS: No acute abnormalities.  IMPRESSION: 1. Unremarkable positioning of a right ureteral stent. No hydronephrosis. 2. Unchanged positioning of 5 mm stone near the right UVJ. 3. Bilateral nephrolithiasis.   Electronically Signed   By: Tiburcio PeaJonathan  Watts M.D.   On: 09/12/2013 01:08   Dg Abd 1 View  09/11/2013   CLINICAL DATA:  Flank pain. Right ureteral stent  placement 09/07/2013  EXAM: ABDOMEN - 1 VIEW  COMPARISON:  09/08/2013  FINDINGS: Unchanged orientation of an internal right ureteral stent. Upper and lower limbs are redundant but fully formed. A 5 mm stone continues  to project at the level of the lower limb, at the ureteral vesicular junction based on interval CT.  Intrarenal calculi are better discerned on preceding CT. There is a large volume of formed stool. No concerning intra-abdominal mass effect. Cholecystectomy.  IMPRESSION: 1. Stable positioning of a right ureteral stent. 2. 5 mm stone likely remains at the right UVJ when correlated with CT from yesterday. 3. Possible constipation.   Electronically Signed   By: Tiburcio Pea M.D.   On: 09/11/2013 22:40    Scheduled Meds: . enoxaparin (LOVENOX) injection  40 mg Subcutaneous Q24H  . HYDROmorphone PCA 0.3 mg/mL   Intravenous 6 times per day  . ibuprofen  600 mg Oral 4 times per day  . QUEtiapine  200 mg Oral QHS  . vancomycin  1,000 mg Intravenous Q8H   Continuous Infusions:    Time spent: > 35 minutes    Penny Pia  [facility] Pager [phone] If 7PM-7AM, please contact night-coverage at [email or web address], password [facility] 09/13/2013, 2:19 PM  LOS: 2 days

## 2013-09-14 LAB — OXYCODONE SCREEN, URINE: Oxycodone Screen, Ur: POSITIVE ng/mL — ABNORMAL HIGH

## 2013-09-14 NOTE — Progress Notes (Signed)
[facility] PROGRESS NOTE  [name] FerrisShada L Austin ZOX:[id number] DOB: March 03, 1985 DOA: 09/11/2013 PCP: No PCP Per Patient  Assessment/Plan:  Principal Problem:   MRSA bacteremia - ID on board.  - Pt on Vancomycin - Echocardiogram obtained and reported no evidence of vegetations  Active Problems:   Nephrolithiasis - Urology consulted and plan will be after discharge she is to f/u with urology as outpatient for cystoscopy, stent extraction, ureteroscopy   Code Status: full Family Communication: None at bedside Disposition Plan: Pending recommendations from specialist on board   Consultants:  ID  Urology  Procedures:  pending  Antibiotics:  Vancomycin   HPI/Subjective: Pt has no new complaints. No acute issues reported overnight. PCA pump ameliorating pain  Objective: Filed Vitals:   09/14/13 1336  BP: 134/91  Pulse: 76  Temp: 98.2 F (36.8 C)  Resp: 16    Intake/Output Summary (Last 24 hours) at 09/14/13 1505 Last data filed at 09/14/13 1303  Gross per 24 hour  Intake    920 ml  Output   1100 ml  Net   -180 ml   Filed Weights   09/11/13 2021  Weight: 98.431 kg (217 lb)    Exam:   General:  Pt in nad, alert and awake  Cardiovascular: warm extremities, no edema  Respiratory: no increased wob, no wheezes  Abdomen: ND, soft, NT  Musculoskeletal: no cyanosis or clubbing   Data Reviewed: Basic Metabolic Panel:  Recent Labs Lab 09/11/13 2145 09/12/13 0633  NA 139 141  K 4.2 3.7  CL 102 104  CO2 26 25  GLUCOSE 93 87  BUN 9 6  CREATININE 0.78 0.71  CALCIUM 9.3 9.0   Liver Function Tests:  Recent Labs Lab 09/12/13 0633  AST 21  ALT 23  ALKPHOS 63  BILITOT 0.2*  PROT 7.0  ALBUMIN 3.1*   No results found for this basename: LIPASE, AMYLASE,  in the last 168 hours No results found for this basename: AMMONIA,  in the last 168 hours CBC:  Recent Labs Lab 09/11/13 2145 09/12/13 0633  WBC 6.9 6.5  NEUTROABS 3.3  --   HGB 11.3*  10.8*  HCT 34.9* 33.6*  MCV 94.6 93.9  PLT 407* 383   Cardiac Enzymes: No results found for this basename: CKTOTAL, CKMB, CKMBINDEX, TROPONINI,  in the last 168 hours BNP (last 3 results) No results found for this basename: PROBNP,  in the last 8760 hours CBG: No results found for this basename: GLUCAP,  in the last 168 hours  Recent Results (from the past 240 hour(s))  URINE CULTURE     Status: None   Collection Time    09/11/13  9:02 PM      Result Value Ref Range Status   Specimen Description URINE, CLEAN CATCH   Final   Special Requests NONE   Final   Culture  Setup Time     Final   Value: 09/12/2013 13:44     [facility]formed at [facility]   Colony Count     Final   Value: NO GROWTH     [facility]formed [facility]   Culture     Final   Value: NO GROWTH     [facility]formed at [facility]   Report Status 09/13/2013 FINAL   Final  CULTURE, BLOOD (ROUTINE X 2)     Status: None   Collection Time    09/11/13  9:24 PM      Result Value Ref Range Status  Specimen Description BLOOD RIGHT ANTECUBITAL   Final   Special Requests BOTTLES DRAWN AEROBIC ONLY 4CC   Final   Culture NO GROWTH 3 DAYS   Final   Report Status PENDING   Incomplete  CULTURE, BLOOD (ROUTINE X 2)     Status: None   Collection Time    09/11/13 10:45 PM      Result Value Ref Range Status   Specimen Description BLOOD LEFT HAND   Final   Special Requests BOTTLES DRAWN AEROBIC ONLY 4CC   Final   Culture NO GROWTH 3 DAYS   Final   Report Status PENDING   Incomplete  MRSA PCR SCREENING     Status: None   Collection Time    09/12/13  2:00 PM      Result Value Ref Range Status   MRSA by PCR NEGATIVE  NEGATIVE Final   Comment:            The GeneXpert MRSA Assay (FDA     approved for NASAL specimens     only), is one component of a     comprehensive MRSA colonization     surveillance program. It is not     intended to diagnose MRSA     infection nor to guide or     monitor treatment for      MRSA infections.  CULTURE, BLOOD (ROUTINE X 2)     Status: None   Collection Time    09/12/13  5:56 PM      Result Value Ref Range Status   Specimen Description BLOOD RIGHT HAND   Final   Special Requests BOTTLES DRAWN AEROBIC AND ANAEROBIC 10CC   Final   Culture NO GROWTH 2 DAYS   Final   Report Status PENDING   Incomplete     Studies: No results found.  Scheduled Meds: . enoxaparin (LOVENOX) injection  40 mg Subcutaneous Q24H  . HYDROmorphone PCA 0.3 mg/mL   Intravenous 6 times per day  . QUEtiapine  200 mg Oral QHS  . vancomycin  1,000 mg Intravenous Q8H   Continuous Infusions:    Time spent: > 35 minutes    Penny Pia  [facility] Pager [phone] If 7PM-7AM, please contact night-coverage at [email or web address], password [facility] 09/14/2013, 3:05 PM  LOS: 3 days

## 2013-09-14 NOTE — Progress Notes (Signed)
Patient ID: [name] CooleyShada L [name], female   DOB: 11/07/1985, 28 y.o.   MRN: [id number]  Subjective: The patient reports    Objective: Vital signs in last 24 hours: Temp:  [97.6 F (36.4 C)-98.2 F (36.8 C)] 97.6 F (36.4 C) (08/06 0553) Pulse Rate:  [79-87] 87 (08/06 0553) Resp:  [14-18] 15 (08/06 0832) BP: (148-150)/(91-97) 148/97 mmHg (08/06 0553) SpO2:  [79 %-100 %] 100 % (08/06 0847) FiO2 (%):  [75 %-76 %] 76 % (08/06 0400)A  Intake/Output from previous day: 08/05 0701 - 08/06 0700 In: 1360 [P.O.:960; IV Piggyback:400] Out: 1500 [Urine:1500] Intake/Output this shift: Total I/O In: 360 [P.O.:360] Out: -   Past Medical History  Diagnosis Date  . Ovarian cyst   . UTI (lower urinary tract infection)   . Asthma   . Kidney stones   . Calcium oxalate renal stones   . Chronic pelvic pain in female     Physical Exam:  Lungs - Normal respiratory effort, chest expands symmetrically.  Abdomen - Soft, non-tender & non-distended.  Lab Results:  Recent Labs  09/11/13 2145 09/12/13 0633  WBC 6.9 6.5  HGB 11.3* 10.8*  HCT 34.9* 33.6*   BMET  Recent Labs  09/11/13 2145 09/12/13 0633  NA 139 141  K 4.2 3.7  CL 102 104  CO2 26 25  GLUCOSE 93 87  BUN 9 6  CREATININE 0.78 0.71  CALCIUM 9.3 9.0   No results found for this basename: LABURIN,  in the last 72 hours Results for orders placed during the hospital encounter of 09/11/13  URINE CULTURE     Status: None   Collection Time    09/11/13  9:02 PM      Result Value Ref Range Status   Specimen Description URINE, CLEAN CATCH   Final   Special Requests NONE   Final   Culture  Setup Time     Final   Value: 09/12/2013 13:44     [facility]formed at [facility]   Colony Count     Final   Value: NO GROWTH     [facility]formed at [facility]   Culture     Final   Value: NO GROWTH     [facility]formed at [facility]   Report Status 09/13/2013 FINAL   Final  CULTURE, BLOOD (ROUTINE X 2)     Status: None   Collection Time    09/11/13  9:24 PM      Result Value Ref Range Status   Specimen Description BLOOD RIGHT ANTECUBITAL   Final   Special Requests BOTTLES DRAWN AEROBIC ONLY 4CC   Final   Culture NO GROWTH 3 DAYS   Final   Report Status PENDING   Incomplete  CULTURE, BLOOD (ROUTINE X 2)     Status: None   Collection Time    09/11/13 10:45 PM      Result Value Ref Range Status   Specimen Description BLOOD LEFT HAND   Final   Special Requests BOTTLES DRAWN AEROBIC ONLY 4CC   Final   Culture NO GROWTH 3 DAYS   Final   Report Status PENDING   Incomplete  MRSA PCR SCREENING     Status: None   Collection Time    09/12/13  2:00 PM      Result Value Ref Range Status   MRSA by PCR NEGATIVE  NEGATIVE Final   Comment:            The GeneXpert MRSA Assay (FDA  approved for NASAL specimens     only), is one component of a     comprehensive MRSA colonization     surveillance program. It is not     intended to diagnose MRSA     infection nor to guide or     monitor treatment for     MRSA infections.  CULTURE, BLOOD (ROUTINE X 2)     Status: None   Collection Time    09/12/13  5:56 PM      Result Value Ref Range Status   Specimen Description BLOOD RIGHT HAND   Final   Special Requests BOTTLES DRAWN AEROBIC AND ANAEROBIC 10CC   Final   Culture NO GROWTH 2 DAYS   Final   Report Status PENDING   Incomplete    Studies/Results: No results found.  Assessment: I was contacted earlier today by Dr. Daiva Eves regarding this patient's stent and its possible removal.  She had the stent placed for an obstructing stone last week.  The stone is now noted to be in the distal ureter on her most recent imaging study done on 09/12/13.  The concern is the fact that she did have E coli and MRSA and has been treated with clearance of her infection from the urine.  According to Dr. Daiva Eves with the urine now clear his thought was that the stent could serve as a nidus of infection and she would likely be better  off with the stent removed.  While this is entirely reasonable with sterile urine now I would be highly reluctant to remove the stent at this time with the stone still present in her distal ureter.  While there is a good possibility of spontaneous passage of the stone once the stent is then removed there is also a risk of redevelopment of obstruction with colic.  The stent has only been in a short while and a biofilm certainly could have developed but I think she would probably benefit from removal of her stent once her stone has passed from ureteroscopic extraction of her stone at the time of stent removal in order to ensure that she is stone free and not at risk at redeveloping ureteral obstruction especially since she has had history of infections in her urine in the past. It is my understanding that compliance may be an issue.  I therefore will make sure that her urine is being strained so that if her stone passes her stent can be removed at the bedside.  On the other hand if she does not pass her stone she may need to have it removed ureteroscopically during this admission.  Plan: 1.  I have asked her nurse to begin straining all urine. 2.  I will make her n.p.o.  Jaclyn Carew C 09/14/2013, 11:50 AM

## 2013-09-14 NOTE — Progress Notes (Signed)
[facility]    Subjective: No new complaints   Antibiotics:  Anti-infectives   Start     Dose/Rate Route Frequency Ordered Stop   09/13/13 1600  ciprofloxacin (CIPRO) IVPB 400 mg  Status:  Discontinued     400 mg 200 mL/hr over 60 Minutes Intravenous Every 12 hours 09/13/13 1436 09/13/13 1501   09/12/13 1000  vancomycin (VANCOCIN) IVPB 1000 mg/200 mL premix     1,000 mg 200 mL/hr over 60 Minutes Intravenous Every 8 hours 09/12/13 0326     09/12/13 0800  ciprofloxacin (CIPRO) tablet 500 mg  Status:  Discontinued     500 mg Oral Every 12 hours 09/12/13 0314 09/12/13 1715   09/12/13 0000  vancomycin (VANCOCIN) IVPB 1000 mg/200 mL premix  Status:  Discontinued     1,000 mg 200 mL/hr over 60 Minutes Intravenous Every 8 hours 09/11/13 2341 09/12/13 0314      Medications: Scheduled Meds: . enoxaparin (LOVENOX) injection  40 mg Subcutaneous Q24H  . HYDROmorphone PCA 0.3 mg/mL   Intravenous 6 times per day  . QUEtiapine  200 mg Oral QHS  . vancomycin  1,000 mg Intravenous Q8H   Continuous Infusions:  PRN Meds:.diphenhydrAMINE, diphenhydrAMINE, HYDROmorphone (DILAUDID) injection, naloxone, promethazine, sodium chloride    Objective: Weight change:   Intake/Output Summary (Last 24 hours) at 09/14/13 1650 Last data filed at 09/14/13 1500  Gross per 24 hour  Intake   1640 ml  Output   1100 ml  Net    540 ml   Blood pressure 134/91, pulse 76, temperature 98.2 F (36.8 C), temperature source Oral, resp. rate 14, height 5\' 4"  (1.626 m), weight 217 lb (98.431 kg), last menstrual period 08/28/2013, SpO2 100.00%. Temp:  [97.6 F (36.4 C)-98.2 F (36.8 C)] 98.2 F (36.8 C) (08/06 1336) Pulse Rate:  [76-87] 76 (08/06 1336) Resp:  [14-18] 14 (08/06 1539) BP: (134-148)/(91-97) 134/91 mmHg (08/06 1336) SpO2:  [79 %-100 %] 100 % (08/06 1539) FiO2 (%):  [75 %-76 %] 76 % (08/06 0400)  Physical Exam:  General: Alert and awake, oriented x3, anxious,  dysphoric  HEENT: anicteric sclera,EOMI, oropharynx clear and without exudate  CVS regular rate, normal r, no murmur rubs or gallops  Chest: clear to auscultation bilaterally, no wheezing, rales or rhonchi  Abdomen: soft nontender, nondistended, normal bowel sounds,  Extremities: no clubbing or edema noted bilaterally  Skin: no rashes  Neuro: nonfocal, strength and sensation intact  CBC:  Recent Labs Lab 09/11/13 2145 09/12/13 0633  HGB 11.3* 10.8*  HCT 34.9* 33.6*  PLT 407* 383     BMET  Recent Labs  09/11/13 2145 09/12/13 0633  NA 139 141  K 4.2 3.7  CL 102 104  CO2 26 25  GLUCOSE 93 87  BUN 9 6  CREATININE 0.78 0.71  CALCIUM 9.3 9.0     Liver Panel   Recent Labs  09/12/13 0633  PROT 7.0  ALBUMIN 3.1*  AST 21  ALT 23  ALKPHOS 63  BILITOT 0.2*       Sedimentation Rate No results found for this basename: ESRSEDRATE,  in the last 72 hours C-Reactive Protein No results found for this basename: CRP,  in the last 72 hours  Micro Results: Recent Results (from the past 720 hour(s))  URINE CULTURE     Status: None   Collection Time    09/11/13  9:02 PM      Result Value Ref Range Status   Specimen  Description URINE, CLEAN CATCH   Final   Special Requests NONE   Final   Culture  Setup Time     Final   Value: 09/12/2013 13:44     Performed at [facility] Count     Final   Value: NO GROWTH     Performed at [facility]   Culture     Final   Value: NO GROWTH     Performed at [facility]   Report Status 09/13/2013 FINAL   Final  CULTURE, BLOOD (ROUTINE X 2)     Status: None   Collection Time    09/11/13  9:24 PM      Result Value Ref Range Status   Specimen Description BLOOD RIGHT ANTECUBITAL   Final   Special Requests BOTTLES DRAWN AEROBIC ONLY 4CC   Final   Culture NO GROWTH 3 DAYS   Final   Report Status PENDING   Incomplete  CULTURE, BLOOD (ROUTINE X 2)     Status: None   Collection Time    09/11/13  10:45 PM      Result Value Ref Range Status   Specimen Description BLOOD LEFT HAND   Final   Special Requests BOTTLES DRAWN AEROBIC ONLY 4CC   Final   Culture NO GROWTH 3 DAYS   Final   Report Status PENDING   Incomplete  MRSA PCR SCREENING     Status: None   Collection Time    09/12/13  2:00 PM      Result Value Ref Range Status   MRSA by PCR NEGATIVE  NEGATIVE Final   Comment:            The GeneXpert MRSA Assay (FDA     approved for NASAL specimens     only), is one component of a     comprehensive MRSA colonization     surveillance program. It is not     intended to diagnose MRSA     infection nor to guide or     monitor treatment for     MRSA infections.  CULTURE, BLOOD (ROUTINE X 2)     Status: None   Collection Time    09/12/13  5:56 PM      Result Value Ref Range Status   Specimen Description BLOOD RIGHT HAND   Final   Special Requests BOTTLES DRAWN AEROBIC AND ANAEROBIC 10CC   Final   Culture NO GROWTH 2 DAYS   Final   Report Status PENDING   Incomplete    Studies/Results: No results found.    Assessment/Plan:  Principal Problem:   MRSA bacteremia Active Problems:   Nephrolithiasis   Nausea   Flank pain    [name] is a 28 y.o. female with  MRSA bacteremia, likely from urine as source with stent as nidus for persistent infection. TYpically we do not think of MRSA as a "urnary pathogen" but in this case it may be. Her TTE has NOT ruled out endocarditis and I am concerned she may indeed be at high risk for this esp given that she did not receive effective antibiotics against MRSA when she was in the hospital at [facility] the first time nor she discharged on program buttocks. Then she left AGAINST MEDICAL ADVICE. When she has had multiple days of unopposed MRSA bacteremia which is a high risk factor for endocarditis and actually is part of the Dukes criteria if it is persistently positive.   #1  MRSA bacteremia with MRSA also found in the urine   [facility] Antimicrobial Management Team Staphylococcus aureus bacteremia   Staphylococcus aureus bacteremia (SAB) is associated with a high rate of complications and mortality. Specific aspects of clinical management are critical to optimizing the outcome of patients with SAB. Therefore, the [facility] Antimicrobial Management Team [facility]) has initiated an intervention aimed at improving the management of SAB at [facility]. To do so, Infectious Diseases physicians are providing an evidence-based consult for the management of all patients with SAB.      Yes  No  Comments   Perform follow-up blood cultures (even if the patient is afebrile) to ensure clearance of bacteremia  [X]   [ ]   NGTD  Remove vascular catheter and obtain follow-up blood cultures after the removal of the catheter  [ ]   [ ]   DO NOT PLACE PICC LINE AT THIS POINT   Perform echocardiography to evaluate for endocarditis (transthoracic ECHO is 40-50% sensitive, TEE is > 90% sensitive)  [X]   [ ]   Please keep in mind, that neither test can definitively EXCLUDE endocarditis, and that should clinical suspicion remain high for endocarditis the patient should then still be treated with an "endocarditis" duration of therapy = 6 weeks   Consult electrophysiologist to evaluate implanted cardiac device (pacemaker, ICD)  [ ]   [ ]   NA   Ensure source control  [ ]   [X]   Have all abscesses been drained effectively?  Have deep seeded infections (septic joints or osteomyelitis) had appropriate surgical debridement?   IDEALLY STENT NEEDS TO COME OUT , Patient seen again by Dr. Vernie Ammons today with whom I spoke over the phone. GREATLY APPRECIATE HIS THOUGHTFUL AND THOROUGH CONSULT!!  Investigate for "metastatic" sites of infection  [ ]   [ ]   Does the patient have ANY symptom or physical exam finding that would suggest a deeper infection (back or neck pain that may be suggestive of vertebral osteomyelitis or epidural abscess, muscle pain that could be a symptom of  pyomyositis)?  Keep in mind that for deep seeded infections MRI imaging with contrast is preferred rather than other often insensitive tests such as plain x-rays, especially early in a patient's presentation.  CONSIDER MRI L SPINE   Change antibiotic therapy to vancomycin  [X]   [ ]   Beta-lactam antibiotics are preferred for MSSA due to higher cure rates.  If on Vancomycin, goal trough should be 15 - 20 mcg/mL   Estimated duration of IV antibiotic therapy: 4-6 weeks  [ ]   [ ]   Consult case management for probably prolonged outpatient IV antibiotic therapy   I spent greater than 25 minutes with the patient including greater than 50% of time in face to face counsel of the patient and in coordination of their care.   #2 E coli also found in urine on 09/07/13: not recovered since then and patient DID receive antibiotics in form of cipro as inpatient and as an outpatient with an effective against this organism I see no reason to treat it anymore.   #3 screening recheck for HIV she was seronegative this fall   #4 ? Narcotic seeking behavior: kidney stones certainly painful so could be wrong in thinking this. Will tox screen urine (understanidng she is on narcotics now)    LOS: 3 days   [name] 09/14/2013, 4:50 PM

## 2013-09-15 ENCOUNTER — Encounter (HOSPITAL_COMMUNITY)
Admission: EM | Disposition: A | Discharge status: Home Health | Payer: Self-pay | Source: Home / Self Care | Attending: Family Medicine

## 2013-09-15 ENCOUNTER — Inpatient Hospital Stay (HOSPITAL_COMMUNITY): Payer: Medicaid Other | Admitting: Certified Registered Nurse Anesthetist

## 2013-09-15 ENCOUNTER — Other Ambulatory Visit: Payer: Self-pay | Admitting: Urology

## 2013-09-15 ENCOUNTER — Inpatient Hospital Stay (HOSPITAL_COMMUNITY): Payer: Medicaid Other

## 2013-09-15 ENCOUNTER — Encounter (HOSPITAL_COMMUNITY): Payer: Self-pay | Admitting: Anesthesiology

## 2013-09-15 ENCOUNTER — Encounter (HOSPITAL_COMMUNITY): Payer: Medicaid Other | Admitting: Certified Registered Nurse Anesthetist

## 2013-09-15 HISTORY — PX: CYSTOSCOPY WITH RETROGRADE PYELOGRAM, URETEROSCOPY AND STENT PLACEMENT: SHX5789

## 2013-09-15 SURGERY — CYSTOURETEROSCOPY, WITH RETROGRADE PYELOGRAM AND STENT INSERTION
Anesthesia: General | Site: Ureter | Laterality: Right

## 2013-09-15 MED ORDER — METOCLOPRAMIDE HCL 5 MG/ML IJ SOLN
INTRAMUSCULAR | Status: DC | PRN
  Administered 2013-09-15: 10 mg via INTRAVENOUS

## 2013-09-15 MED ORDER — LACTATED RINGERS IV SOLN
INTRAVENOUS | Status: DC | PRN
  Administered 2013-09-15: 15:00:00 via INTRAVENOUS

## 2013-09-15 MED ORDER — SCOPOLAMINE 1 MG/3DAYS TD PT72
MEDICATED_PATCH | TRANSDERMAL | Status: DC | PRN
  Administered 2013-09-15: 1 via TRANSDERMAL

## 2013-09-15 MED ORDER — HYDROMORPHONE HCL PF 1 MG/ML IJ SOLN
INTRAMUSCULAR | Status: AC
  Filled 2013-09-15: qty 1

## 2013-09-15 MED ORDER — HYDROMORPHONE HCL PF 1 MG/ML IJ SOLN
0.5000 mg | INTRAMUSCULAR | Status: DC | PRN
  Administered 2013-09-15: 0.5 mg via INTRAVENOUS

## 2013-09-15 MED ORDER — PROMETHAZINE HCL 25 MG/ML IJ SOLN
6.2500 mg | INTRAMUSCULAR | Status: DC | PRN
  Administered 2013-09-15: 6.25 mg via INTRAVENOUS

## 2013-09-15 MED ORDER — DEXAMETHASONE SODIUM PHOSPHATE 10 MG/ML IJ SOLN
INTRAMUSCULAR | Status: AC
  Filled 2013-09-15: qty 1

## 2013-09-15 MED ORDER — SCOPOLAMINE 1 MG/3DAYS TD PT72
MEDICATED_PATCH | TRANSDERMAL | Status: AC
  Filled 2013-09-15: qty 1

## 2013-09-15 MED ORDER — CIPROFLOXACIN IN D5W 400 MG/200ML IV SOLN
INTRAVENOUS | Status: DC | PRN
  Administered 2013-09-15: 400 mg via INTRAVENOUS

## 2013-09-15 MED ORDER — LIDOCAINE HCL (CARDIAC) 20 MG/ML IV SOLN
INTRAVENOUS | Status: AC
  Filled 2013-09-15: qty 5

## 2013-09-15 MED ORDER — PROPOFOL 10 MG/ML IV BOLUS
INTRAVENOUS | Status: AC
  Filled 2013-09-15: qty 20

## 2013-09-15 MED ORDER — FENTANYL CITRATE 0.05 MG/ML IJ SOLN
INTRAMUSCULAR | Status: AC
  Filled 2013-09-15: qty 2

## 2013-09-15 MED ORDER — CIPROFLOXACIN IN D5W 400 MG/200ML IV SOLN
INTRAVENOUS | Status: AC
  Filled 2013-09-15: qty 200

## 2013-09-15 MED ORDER — ONDANSETRON HCL 4 MG/2ML IJ SOLN
INTRAMUSCULAR | Status: AC
  Filled 2013-09-15: qty 2

## 2013-09-15 MED ORDER — MIDAZOLAM HCL 2 MG/2ML IJ SOLN
INTRAMUSCULAR | Status: AC
  Filled 2013-09-15: qty 2

## 2013-09-15 MED ORDER — LIDOCAINE HCL (CARDIAC) 20 MG/ML IV SOLN
INTRAVENOUS | Status: DC | PRN
  Administered 2013-09-15: 100 mg via INTRAVENOUS

## 2013-09-15 MED ORDER — FENTANYL CITRATE 0.05 MG/ML IJ SOLN
25.0000 ug | INTRAMUSCULAR | Status: DC | PRN
  Administered 2013-09-15 (×2): 50 ug via INTRAVENOUS

## 2013-09-15 MED ORDER — SODIUM CHLORIDE 0.9 % IR SOLN
Status: DC | PRN
  Administered 2013-09-15: 1000 mL
  Administered 2013-09-15: 3000 mL

## 2013-09-15 MED ORDER — DEXAMETHASONE SODIUM PHOSPHATE 10 MG/ML IJ SOLN
INTRAMUSCULAR | Status: DC | PRN
  Administered 2013-09-15: 10 mg via INTRAVENOUS

## 2013-09-15 MED ORDER — FENTANYL CITRATE 0.05 MG/ML IJ SOLN
INTRAMUSCULAR | Status: DC | PRN
  Administered 2013-09-15 (×4): 50 ug via INTRAVENOUS

## 2013-09-15 MED ORDER — SUCCINYLCHOLINE CHLORIDE 20 MG/ML IJ SOLN
INTRAMUSCULAR | Status: DC | PRN
  Administered 2013-09-15: 100 mg via INTRAVENOUS

## 2013-09-15 MED ORDER — PROMETHAZINE HCL 25 MG/ML IJ SOLN
INTRAMUSCULAR | Status: AC
  Filled 2013-09-15: qty 1

## 2013-09-15 MED ORDER — PROPOFOL 10 MG/ML IV BOLUS
INTRAVENOUS | Status: DC | PRN
  Administered 2013-09-15: 200 mg via INTRAVENOUS

## 2013-09-15 SURGICAL SUPPLY — 14 items
BAG URO CATCHER STRL LF (DRAPE) ×3 IMPLANT
BASKET ZERO TIP NITINOL 2.4FR (BASKET) ×3 IMPLANT
CATH INTERMIT  6FR 70CM (CATHETERS) ×3 IMPLANT
CLOTH BEACON ORANGE TIMEOUT ST (SAFETY) ×3 IMPLANT
DRAPE CAMERA CLOSED 9X96 (DRAPES) ×3 IMPLANT
GLOVE BIOGEL M 8.0 STRL (GLOVE) ×3 IMPLANT
GOWN STRL REUS W/TWL XL LVL3 (GOWN DISPOSABLE) ×3 IMPLANT
GUIDEWIRE STR DUAL SENSOR (WIRE) ×3 IMPLANT
MANIFOLD NEPTUNE II (INSTRUMENTS) ×3 IMPLANT
PACK CYSTO (CUSTOM PROCEDURE TRAY) ×3 IMPLANT
SHEATH ACCESS URETERAL 38CM (SHEATH) ×3 IMPLANT
TUBING CONNECTING 10 (TUBING) ×2 IMPLANT
TUBING CONNECTING 10' (TUBING) ×1
WIRE COONS/BENSON .038X145CM (WIRE) IMPLANT

## 2013-09-15 NOTE — Progress Notes (Signed)
Lab unable to draw blood x 4 blood sticks this morning. On call NP made aware and new orders received to stick feet.

## 2013-09-15 NOTE — Transfer of Care (Signed)
Immediate Anesthesia Transfer of Care Note  Patient: [name]  Procedure(s) Performed: Procedure(s) (LRB): CYSTOSCOPY WITH RETROGRADE PYELOGRAM, URETEROSCOPY, STONE BASKETRY (Right)  Patient Location: PACU  Anesthesia Type: General  Level of Consciousness: sedated, patient cooperative and responds to stimulation  Airway & Oxygen Therapy: Patient Spontanous Breathing and Patient connected to face mask oxgen  Post-op Assessment: Report given to PACU RN and Post -op Vital signs reviewed and stable  Post vital signs: Reviewed and stable  Complications: No apparent anesthesia complications

## 2013-09-15 NOTE — Op Note (Signed)
Preoperative Diagnosis: nephrolithiasis  Postoperative Diagnosis:  same  Procedure(s) Performed:  Cystourethroscopy, Right ureteroscopy, Basket stone extraction and Intraoperative fluoroscopy with interpretation <1 hr. Right ureteral stent removal.  Teaching Surgeon:  Crecencio McLes Borden, MD  Resident Surgeon:  Dyke BrackettJames "Jed" Varian Innes, MD  Assistant(s):  none  Anesthesia:  General via endotracheal tube  Fluids:  See anesthesia record  Estimated blood loss:  0 mL  Specimens:  Stone for analysis  Cultures:  none  Drains: none  Complications:  none  Indications: 27yF with recent stent for MRSA UTI, bactermia and distal ureteral stone.  Findings:  5mm uric acid appearing stone in distal ureter. This was removed intact with a basket. Complete ureterosocpy afterwards did not show any additional stones. No ureteral stent was needed. No bladder mucosal abnormalities.  Description:  The patient was correctly identified in the preop holding area where written informed consent as well potential risk and complication reviewed. She agreed. They were brought back to the operative suite where a preinduction timeout was performed. Once correct information was verified, general anesthesia was induced via endotracheal tube. They were then gently placed into dorsal lithotomy position with SCDs in place for VTE prophylaxis. They were prepped and draped in the usual sterile fashion and given appropriate preoperative antibiotics.  We inserted a 35F rigid cystoscope per urethra with copious lubrication and normal saline irrigation running. This demonstrated findings as described above. The existing stent was grasped and pulled to the urethral meatus and cannulated with a sensor wire up to the kidney. The stent was removed intact. The stone could be seen emanating from the UO, which was fairly patulous. A zero tip nitinol basket was easily advanced past the stone and used to grasp the stone intact. The semirigid  ureteroscope was then advanced all the way to the kidney, and no mucosal lesions or stones were identified. The wire was removed and the procedure was terminated.  The patient's bladder was emptied, they were awoken from anesthesia, and taken to the recovery room in good condition.   Post Op Plan:   1. Return to primary service for ongoing care. 2. If she develops fevers in the next few days, STAT renal U/S should be obtained.  Attestation:  Dr. Laverle PatterBorden was present for the entire procedure.    Antoni Stefan "Jed" Emelda FearFerguson, MD Resident, Department of Urology

## 2013-09-15 NOTE — Anesthesia Preprocedure Evaluation (Signed)
Anesthesia Evaluation  Patient identified by MRN, date of birth, ID band Patient awake    Reviewed: Allergy & Precautions, H&P , NPO status , Patient's Chart, lab work & pertinent test results  Airway Mallampati: II TM Distance: >3 FB Neck ROM: Full    Dental no notable dental hx.    Pulmonary asthma , Current Smoker,  breath sounds clear to auscultation  Pulmonary exam normal       Cardiovascular negative cardio ROS  Rhythm:Regular Rate:Normal     Neuro/Psych negative neurological ROS  negative psych ROS   GI/Hepatic negative GI ROS, Neg liver ROS,   Endo/Other  negative endocrine ROS  Renal/GU Renal disease  negative genitourinary   Musculoskeletal negative musculoskeletal ROS (+)   Abdominal (+) + obese,   Peds negative pediatric ROS (+)  Hematology negative hematology ROS (+)   Anesthesia Other Findings   Reproductive/Obstetrics negative OB ROS                           Anesthesia Physical Anesthesia Plan  ASA: II  Anesthesia Plan: General   Post-op Pain Management:    Induction: Intravenous  Airway Management Planned: Oral ETT  Additional Equipment:   Intra-op Plan:   Post-operative Plan: Extubation in OR  Informed Consent: I have reviewed the patients History and Physical, chart, labs and discussed the procedure including the risks, benefits and alternatives for the proposed anesthesia with the patient or authorized representative who has indicated his/her understanding and acceptance.   Dental advisory given  Plan Discussed with: CRNA  Anesthesia Plan Comments: (Nauseated. Plan ETT)        Anesthesia Quick Evaluation

## 2013-09-15 NOTE — Progress Notes (Signed)
[facility] PROGRESS NOTE  [name] FerrisShada L [name] WUJ:[id number] DOB: 12-Jan-1986 DOA: 09/11/2013 PCP: No PCP Per Patient  Assessment/Plan:  Principal Problem:   MRSA bacteremia - ID on board.  - Pt on Vancomycin - Echocardiogram obtained and reported no evidence of vegetations  Active Problems:   Nephrolithiasis - Urology consulted and plan is for stone removal.  Disposition will depend on Urology recommendations after procedure. - Urology managing pain control currently.   Code Status: full Family Communication: None at bedside Disposition Plan: Pending recommendations from specialist on board   Consultants:  ID  Urology  Procedures:  pending  Antibiotics:  Vancomycin   HPI/Subjective: Pt has no new complaints. No acute issues reported overnight.   Objective: Filed Vitals:   09/15/13 1722  BP: 158/106  Pulse: 92  Temp: 98.4 F (36.9 C)  Resp: 20    Intake/Output Summary (Last 24 hours) at 09/15/13 1723 Last data filed at 09/15/13 1710  Gross per 24 hour  Intake   1740 ml  Output   2200 ml  Net   -460 ml   Filed Weights   09/11/13 2021  Weight: 98.431 kg (217 lb)    Exam:   General:  Pt in nad, alert and awake  Cardiovascular: warm extremities, no edema  Respiratory: no increased wob, no wheezes  Abdomen: ND, soft, NT  Musculoskeletal: no cyanosis or clubbing   Data Reviewed: Basic Metabolic Panel:  Recent Labs Lab 09/11/13 2145 09/12/13 0633  NA 139 141  K 4.2 3.7  CL 102 104  CO2 26 25  GLUCOSE 93 87  BUN 9 6  CREATININE 0.78 0.71  CALCIUM 9.3 9.0   Liver Function Tests:  Recent Labs Lab 09/12/13 0633  AST 21  ALT 23  ALKPHOS 63  BILITOT 0.2*  PROT 7.0  ALBUMIN 3.1*   No results found for this basename: LIPASE, AMYLASE,  in the last 168 hours No results found for this basename: AMMONIA,  in the last 168 hours CBC:  Recent Labs Lab 09/11/13 2145 09/12/13 0633  WBC 6.9 6.5  NEUTROABS 3.3  --   HGB 11.3*  10.8*  HCT 34.9* 33.6*  MCV 94.6 93.9  PLT 407* 383   Cardiac Enzymes: No results found for this basename: CKTOTAL, CKMB, CKMBINDEX, TROPONINI,  in the last 168 hours BNP (last 3 results) No results found for this basename: PROBNP,  in the last 8760 hours CBG: No results found for this basename: GLUCAP,  in the last 168 hours  Recent Results (from the past 240 hour(s))  URINE CULTURE     Status: None   Collection Time    09/11/13  9:02 PM      Result Value Ref Range Status   Specimen Description URINE, CLEAN CATCH   Final   Special Requests NONE   Final   Culture  Setup Time     Final   Value: 09/12/2013 13:44     [facility]formed at [facility]   Colony Count     Final   Value: NO GROWTH     [facility]formed at [facility]   Culture     Final   Value: NO GROWTH     Performed at [facility]   Report Status 09/13/2013 FINAL   Final  CULTURE, BLOOD (ROUTINE X 2)     Status: None   Collection Time    09/11/13  9:24 PM      Result Value Ref Range Status   Specimen  Description BLOOD RIGHT ANTECUBITAL   Final   Special Requests BOTTLES DRAWN AEROBIC ONLY 4CC   Final   Culture NO GROWTH 4 DAYS   Final   Report Status PENDING   Incomplete  CULTURE, BLOOD (ROUTINE X 2)     Status: None   Collection Time    09/11/13 10:45 PM      Result Value Ref Range Status   Specimen Description BLOOD LEFT HAND   Final   Special Requests BOTTLES DRAWN AEROBIC ONLY 4CC   Final   Culture NO GROWTH 4 DAYS   Final   Report Status PENDING   Incomplete  MRSA PCR SCREENING     Status: None   Collection Time    09/12/13  2:00 PM      Result Value Ref Range Status   MRSA by PCR NEGATIVE  NEGATIVE Final   Comment:            The GeneXpert MRSA Assay (FDA     approved for NASAL specimens     only), is one component of a     comprehensive MRSA colonization     surveillance program. It is not     intended to diagnose MRSA     infection nor to guide or     monitor treatment for      MRSA infections.  CULTURE, BLOOD (ROUTINE X 2)     Status: None   Collection Time    09/12/13  5:56 PM      Result Value Ref Range Status   Specimen Description BLOOD RIGHT HAND   Final   Special Requests BOTTLES DRAWN AEROBIC AND ANAEROBIC 10CC   Final   Culture NO GROWTH 3 DAYS   Final   Report Status PENDING   Incomplete     Studies: Dg Abd 1 View  09/15/2013   CLINICAL DATA:  Evaluate right-sided distal ureteral stone  EXAM: ABDOMEN - 1 VIEW  COMPARISON:  09/11/2013; CT abdomen pelvis -09/12/2013  FINDINGS: Unchanged positioning of right-sided double-J ureteral catheter with superior coil overlying expected location of the right renal pelvis and inferior coil overlying the expected location of the urinary bladder.  Grossly unchanged size then positioning of the approximately 6 mm stone again overlying the expected location of the right UVJ. No additional opacities overlie the right double-J ureteral stent. No definitive opacities overlie the expected location of either renal fossa or the left ureter. Several phleboliths are seen overlying the lower pelvis bilaterally, left greater than right.  Moderate colonic stool burden without evidence of obstruction. Post cholecystectomy. No acute osseus abnormalities.  IMPRESSION: Unchanged size and location of right-sided renal stone again overlying the expected location of the right UVJ.   Electronically Signed   By: Simonne Come M.D.   On: 09/15/2013 09:40    Scheduled Meds: . [MAR HOLD] enoxaparin (LOVENOX) injection  40 mg Subcutaneous Q24H  . fentaNYL      . HYDROmorphone      . [MAR HOLD] HYDROmorphone PCA 0.3 mg/mL   Intravenous 6 times per day  . promethazine      . [MAR HOLD] QUEtiapine  200 mg Oral QHS  . [facility] HOLD] vancomycin  1,000 mg Intravenous Q8H   Continuous Infusions:    Time spent: > 35 minutes    Penny Pia  [facility] Pager [phone] If 7PM-7AM, please contact night-coverage at [email or web address], password  [facility] 09/15/2013, 5:23 PM  LOS: 4 days

## 2013-09-15 NOTE — Progress Notes (Signed)
ANTIBIOTIC CONSULT NOTE - FOLLOW UP  Pharmacy Consult for Vancomycin Indication: bacteremia  Allergies  Allergen Reactions  . Iohexol Hives    ALLERGIC TO IVP DYE, HIVE, PT NEEDS PRE MEDS   . Zofran Nausea And Vomiting  . Ketorolac Tromethamine Rash  . Morphine And Related Rash    Patient Measurements: Height: 5\' 4"  (162.6 cm) Weight: 217 lb (98.431 kg) IBW/kg (Calculated) : 54.7   Vital Signs: Temp: 98.1 F (36.7 C) (08/07 0506) Temp src: Oral (08/07 0506) BP: 140/95 mmHg (08/07 0506) Pulse Rate: 80 (08/07 0506) Intake/Output from previous day: 08/06 0701 - 08/07 0700 In: 2480 [P.O.:1680; IV Piggyback:800] Out: 2300 [Urine:2300] Intake/Output from this shift:    Labs: No results found for this basename: WBC, HGB, PLT, LABCREA, CREATININE,  in the last 72 hours Estimated Creatinine Clearance: 118.3 ml/min (by C-G formula based on Cr of 0.71).  Recent Labs  09/13/13 1659  VANCOTROUGH 17.2     Microbiology: Recent Results (from the past 720 hour(s))  URINE CULTURE     Status: None   Collection Time    09/11/13  9:02 PM      Result Value Ref Range Status   Specimen Description URINE, CLEAN CATCH   Final   Special Requests NONE   Final   Culture  Setup Time     Final   Value: 09/12/2013 13:44     Performed at [facility]   Colony Count     Final   Value: NO GROWTH     Performed at [facility]   Culture     Final   Value: NO GROWTH     Performed at [facility]   Report Status 09/13/2013 FINAL   Final  CULTURE, BLOOD (ROUTINE X 2)     Status: None   Collection Time    09/11/13  9:24 PM      Result Value Ref Range Status   Specimen Description BLOOD RIGHT ANTECUBITAL   Final   Special Requests BOTTLES DRAWN AEROBIC ONLY 4CC   Final   Culture NO GROWTH 3 DAYS   Final   Report Status PENDING   Incomplete  CULTURE, BLOOD (ROUTINE X 2)     Status: None   Collection Time    09/11/13 10:45 PM      Result Value Ref Range Status   Specimen Description BLOOD LEFT HAND   Final   Special Requests BOTTLES DRAWN AEROBIC ONLY 4CC   Final   Culture NO GROWTH 3 DAYS   Final   Report Status PENDING   Incomplete  MRSA PCR SCREENING     Status: None   Collection Time    09/12/13  2:00 PM      Result Value Ref Range Status   MRSA by PCR NEGATIVE  NEGATIVE Final   Comment:            The GeneXpert MRSA Assay (FDA     approved for NASAL specimens     only), is one component of a     comprehensive MRSA colonization     surveillance program. It is not     intended to diagnose MRSA     infection nor to guide or     monitor treatment for     MRSA infections.  CULTURE, BLOOD (ROUTINE X 2)     Status: None   Collection Time    09/12/13  5:56 PM      Result Value Ref Range  Status   Specimen Description BLOOD RIGHT HAND   Final   Special Requests BOTTLES DRAWN AEROBIC AND ANAEROBIC 10CC   Final   Culture NO GROWTH 2 DAYS   Final   Report Status PENDING   Incomplete    Anti-infectives   Start     Dose/Rate Route Frequency Ordered Stop   09/13/13 1600  ciprofloxacin (CIPRO) IVPB 400 mg  Status:  Discontinued     400 mg 200 mL/hr over 60 Minutes Intravenous Every 12 hours 09/13/13 1436 09/13/13 1501   09/12/13 1000  vancomycin (VANCOCIN) IVPB 1000 mg/200 mL premix     1,000 mg 200 mL/hr over 60 Minutes Intravenous Every 8 hours 09/12/13 0326     09/12/13 0800  ciprofloxacin (CIPRO) tablet 500 mg  Status:  Discontinued     500 mg Oral Every 12 hours 09/12/13 0314 09/12/13 1715   09/12/13 0000  vancomycin (VANCOCIN) IVPB 1000 mg/200 mL premix  Status:  Discontinued     1,000 mg 200 mL/hr over 60 Minutes Intravenous Every 8 hours 09/11/13 2341 09/12/13 0314      Assessment: 3yr female with hx of renal stones and placement of ureteral stent approx 1 week ago. Recently admitted to [facility] with right-sided abdominal pain. Found to have kidney stones w/ pyelonephritis  & hydronephrosis on R side. Urine culture at that  time + for e.coli (pansensitive). Then informed she had positive blood culture for MRSA after discharge. Returned to [facility] for further treatment, subsequently transferred to [facility]. Pharmacy consulted to dose Vancomycin for bacteremia.   8/4 >>Vanco >>  Tmax: afebrile WBCs: wnl Renal: wnl CrCl > 100  8/4 blood: sent 8/3 blood: ngtd 8/3 urine: NG F  Drug levels: 8/5 VT 1700 = 17.2 - therapeutic - no change  Goal of Therapy:  Vancomycin trough level 15-20 mcg/ml Appropriate antibiotic dosing for renal function; eradication of infection  Plan:   Continue Vancomycin 1g IV Q8H  F/u culture results  Loma Boston, PharmD Pager: [phone] 09/15/2013 9:21 AM

## 2013-09-15 NOTE — Progress Notes (Signed)
Subjective: This patient was transferred here from [facility] shortly after I saw her on Tuesday as an inpatient consult.  I was unaware of [facility].  Apparently, she is still in pain.  She is being adequately treated for MRSApositive blood cultures.  She also had Escherichia coli in her urine.  It appeared that her stone had moved more distally, to her right UVJ.  Her current pain is out of proportion to her stent placement.  It is unusual to need a PCA with Dilaudid for properly stented stone.  Objective: Vital signs in last 24 hours: Temp:  [98.1 F (36.7 C)-98.3 F (36.8 C)] 98.1 F (36.7 C) (08/07 0506) Pulse Rate:  [76-83] 80 (08/07 0506) Resp:  [13-17] 14 (08/07 0506) BP: (134-144)/(91-95) 140/95 mmHg (08/07 0506) SpO2:  [98 %-100 %] 98 % (08/07 0506)  Intake/Output from previous day: 08/06 0701 - 08/07 0700 In: 2480 [P.O.:1680; IV Piggyback:800] Out: 2300 [Urine:2300] Intake/Output this shift:    Physical Exam:  Constitutional: Vital signs reviewed. WD WN in NAD   Eyes: PERRL, No scleral icterus.   Cardiovascular: RRR Pulmonary/Chest: Normal effort Abdominal: Soft. Non-tender, non-distended, bowel sounds are normal, no masses, organomegaly, or guarding present.  Genitourinary: Extremities: No cyanosis or edema   Lab Results: No results found for this basename: HGB, HCT,  in the last 72 hours BMET No results found for this basename: NA, K, CL, CO2, GLUCOSE, BUN, CREATININE, CALCIUM,  in the last 72 hours No results found for this basename: LABPT, INR,  in the last 72 hours No results found for this basename: LABURIN,  in the last 72 hours Results for orders placed during the hospital encounter of 09/11/13  URINE CULTURE     Status: None   Collection Time    09/11/13  9:02 PM      Result Value Ref Range Status   Specimen Description URINE, CLEAN CATCH   Final   Special Requests NONE   Final   Culture  Setup Time     Final   Value:  09/12/2013 13:44     Performed at [facility]   Colony Count     Final   Value: NO GROWTH     Performed at [facility]   Culture     Final   Value: NO GROWTH     Performed at [facility]   Report Status 09/13/2013 FINAL   Final  CULTURE, BLOOD (ROUTINE X 2)     Status: None   Collection Time    09/11/13  9:24 PM      Result Value Ref Range Status   Specimen Description BLOOD RIGHT ANTECUBITAL   Final   Special Requests BOTTLES DRAWN AEROBIC ONLY 4CC   Final   Culture NO GROWTH 3 DAYS   Final   Report Status PENDING   Incomplete  CULTURE, BLOOD (ROUTINE X 2)     Status: None   Collection Time    09/11/13 10:45 PM      Result Value Ref Range Status   Specimen Description BLOOD LEFT HAND   Final   Special Requests BOTTLES DRAWN AEROBIC ONLY 4CC   Final   Culture NO GROWTH 3 DAYS   Final   Report Status PENDING   Incomplete  MRSA PCR SCREENING     Status: None   Collection Time    09/12/13  2:00 PM      Result Value Ref Range Status  MRSA by PCR NEGATIVE  NEGATIVE Final   Comment:            The GeneXpert MRSA Assay (FDA     approved for NASAL specimens     only), is one component of a     comprehensive MRSA colonization     surveillance program. It is not     intended to diagnose MRSA     infection nor to guide or     monitor treatment for     MRSA infections.  CULTURE, BLOOD (ROUTINE X 2)     Status: None   Collection Time    09/12/13  5:56 PM      Result Value Ref Range Status   Specimen Description BLOOD RIGHT HAND   Final   Special Requests BOTTLES DRAWN AEROBIC AND ANAEROBIC 10CC   Final   Culture NO GROWTH 2 DAYS   Final   Report Status PENDING   Incomplete    Studies/Results: No results found.  Assessment/Plan:   Right ureteral calculus with urine positive for Escherichia coli, blood cultures positive for MRSA.  She is properly stented.  Her stent was placed about 8 days ago.  I do not think that she'll be able to getout of the  hospital until her stone is removed.  I think her UTI has been adequately treated.  However, she will probably need a stent for a couple of days after her stone is removed treat any local edema from stone manipulation.  I will see about getting her on the schedule here today or tomorrow to have the stone removed.  For now, keep her NPO.   LOS: 4 days   Marcine Matar M 09/15/2013, 7:55 AM

## 2013-09-16 LAB — CULTURE, BLOOD (ROUTINE X 2)
CULTURE: NO GROWTH
Culture: NO GROWTH

## 2013-09-16 MED ORDER — OXYCODONE HCL 5 MG PO TABS: 5.0000 mg | ORAL_TABLET | Freq: Four times a day (QID) | ORAL | Status: AC | PRN

## 2013-09-16 MED ORDER — SODIUM CHLORIDE 0.9 % IJ SOLN
10.0000 mL | INTRAMUSCULAR | Status: DC | PRN
  Administered 2013-09-16: 10 mL

## 2013-09-16 MED ORDER — OXYCODONE HCL 5 MG PO TABS
5.0000 mg | ORAL_TABLET | Freq: Four times a day (QID) | ORAL | Status: DC | PRN
  Administered 2013-09-16: 5 mg via ORAL
  Filled 2013-09-16: qty 1

## 2013-09-16 MED ORDER — OXYCODONE HCL ER 10 MG PO T12A
10.0000 mg | EXTENDED_RELEASE_TABLET | Freq: Two times a day (BID) | ORAL | Status: DC
  Administered 2013-09-16: 10 mg via ORAL
  Filled 2013-09-16: qty 1

## 2013-09-16 MED ORDER — SODIUM CHLORIDE 0.9 % IJ SOLN: 10.0000 mL | Freq: Two times a day (BID) | INTRAMUSCULAR | Status: DC

## 2013-09-16 MED ORDER — IBUPROFEN 800 MG PO TABS
800.0000 mg | ORAL_TABLET | Freq: Three times a day (TID) | ORAL | Status: DC | PRN
  Administered 2013-09-16: 800 mg via ORAL
  Filled 2013-09-16: qty 1

## 2013-09-16 MED ORDER — VANCOMYCIN HCL IN DEXTROSE 1-5 GM/200ML-% IV SOLN: 1000.0000 mg | Freq: Three times a day (TID) | INTRAVENOUS | Status: DC

## 2013-09-16 NOTE — Anesthesia Postprocedure Evaluation (Signed)
  Anesthesia Post-op Note  Patient: [name]  Procedure(s) Performed: Procedure(s) (LRB): CYSTOSCOPY WITH RETROGRADE PYELOGRAM, URETEROSCOPY, STONE BASKETRY (Right)  Patient Location: PACU  Anesthesia Type: General  Level of Consciousness: awake and alert   Airway and Oxygen Therapy: Patient Spontanous Breathing  Post-op Pain: mild  Post-op Assessment: Post-op Vital signs reviewed, Patient's Cardiovascular Status Stable, Respiratory Function Stable, Patent Airway and No signs of Nausea or vomiting  Last Vitals:  Filed Vitals:   09/16/13 0536  BP: 155/99  Pulse: 71  Temp: 36.6 C  Resp: 16    Post-op Vital Signs: stable   Complications: No apparent anesthesia complications

## 2013-09-16 NOTE — Discharge Summary (Signed)
Physician Discharge Summary  [name] ZOX:[id number] DOB: 14-May-1985 DOA: 09/11/2013  PCP: No PCP Per Patient  Admit date: 09/11/2013 Discharge date: 09/16/2013  Time spent: > 35 minutes  Recommendations for Outpatient Follow-up:  1. Patient is to follow up with infectious disease doctor within the next 4 weeks 2. Home health nursing agency will be set up to help administer vancomycin. Vancomycin levels are to be monitored as per home health protocol and results called into her infectious disease doctor.  Discharge Diagnoses:  Principal Problem:   MRSA bacteremia Active Problems:   Nephrolithiasis   Nausea   Flank pain   Discharge Condition: Stable  Diet recommendation: Regular diet  Filed Weights   09/11/13 2021  Weight: 98.431 kg (217 lb)    History of present illness:  From original history of present illness 28 year old female who has a past medical history of Ovarian cyst; UTI (lower urinary tract infection); Asthma; Kidney stones; Calcium oxalate renal stones; and Chronic pelvic pain in female   Hospital Course:  Principal Problem:   MRSA bacteremia  - ID on board.  - Pt on Vancomycin to complete a 6 week regimen - Plan is for patient to followup with ID specialist. Home health will be set up and vancomycin routine monitoring results will be called into ID specialist office  Active Problems:   Nephrolithiasis  - Urology consulted and plan is for stone removal.  - Patient is status post: day 1 uteroscopic stone and stent removal - Discharge on oral pain medications.   Procedures:  As listed above  Consultations:  Urologist: Dr. Laverle Patter  ID: Daiva Eves  Discharge Exam: Ceasar Mons Vitals:   09/16/13 1458  BP: 144/78  Pulse: 82  Temp: 98.4 F (36.9 C)  Resp: 18    General: Pt in nad, alert and awake Cardiovascular: rrr, no mrg Respiratory: cta bl, no wheezes  Discharge Instructions You were cared for by a hospitalist during your hospital stay.  If you have any questions about your discharge medications or the care you received while you were in the hospital after you are discharged, you can call the unit and asked to speak with the hospitalist on call if the hospitalist that took care of you is not available. Once you are discharged, your primary care physician will handle any further medical issues. Please note that NO REFILLS for any discharge medications will be authorized once you are discharged, as it is imperative that you return to your primary care physician (or establish a relationship with a primary care physician if you do not have one) for your aftercare needs so that they can reassess your need for medications and monitor your lab values.     Medication List    STOP taking these medications       ciprofloxacin 500 MG tablet  Commonly known as:  CIPRO      TAKE these medications       ibuprofen 800 MG tablet  Commonly known as:  ADVIL,MOTRIN  Take 800 mg by mouth daily as needed for pain.     oxyCODONE 5 MG immediate release tablet  Commonly known as:  Oxy IR/ROXICODONE  Take 1 tablet (5 mg total) by mouth every 6 (six) hours as needed for breakthrough pain.     QUEtiapine 100 MG tablet  Commonly known as:  SEROQUEL  Take 200 mg by mouth at bedtime.     vancomycin 1 GM/200ML Soln  Commonly known as:  VANCOCIN  Inject 200 mLs (  1,000 mg total) into the vein every 8 (eight) hours.       Allergies  Allergen Reactions  . Iohexol Hives    ALLERGIC TO IVP DYE, HIVE, PT NEEDS PRE MEDS   . Zofran Nausea And Vomiting  . Ketorolac Tromethamine Rash  . Morphine And Related Rash       Follow-up Information   Follow up with No PCP Per Patient.   Specialty:  General Practice       The results of significant diagnostics from this hospitalization (including imaging, microbiology, ancillary and laboratory) are listed below for reference.    Significant Diagnostic Studies: Ct Abdomen Pelvis Wo  Contrast  09/12/2013   CLINICAL DATA:  Flank pain. Question complication, including abscess  EXAM: CT ABDOMEN AND PELVIS WITHOUT CONTRAST  TECHNIQUE: Multidetector CT imaging of the abdomen and pelvis was performed following the standard protocol without IV contrast.  COMPARISON:  09/10/2013  FINDINGS: BODY WALL: Unremarkable.  LOWER CHEST: Mild basilar atelectasis.  ABDOMEN/PELVIS:  Liver: No focal abnormality.  Biliary: Cholecystectomy.  Pancreas: Unremarkable.  Spleen: Unremarkable.  Adrenals: Unremarkable.  Kidneys and ureters: An internal ureteral stent on the right remains in unchanged position. 5 mm calculus associated with the distal stent is unchanged, likely within the ureteral vesicular junction. Bilateral nephrolithiasis, 3 mm in the left lower pole and 4 mm in the right lower pole. There is mild asymmetric perinephric edema on the right. No hydronephrosis. Noncontrast CT limited for detecting abscess, as clinically questioned.  Bladder: Mild wall thickening circumferentially, accentuated by under distention.  Reproductive: Unremarkable.  Bowel: No obstruction.  Retroperitoneum: No mass or adenopathy.  Peritoneum: No ascites or pneumoperitoneum.  Vascular: No acute abnormality.  OSSEOUS: No acute abnormalities.  IMPRESSION: 1. Unremarkable positioning of a right ureteral stent. No hydronephrosis. 2. Unchanged positioning of 5 mm stone near the right UVJ. 3. Bilateral nephrolithiasis.   Electronically Signed   By: Tiburcio Pea M.D.   On: 09/12/2013 01:08   Dg Abd 1 View  09/15/2013   CLINICAL DATA:  Evaluate right-sided distal ureteral stone  EXAM: ABDOMEN - 1 VIEW  COMPARISON:  09/11/2013; CT abdomen pelvis -09/12/2013  FINDINGS: Unchanged positioning of right-sided double-J ureteral catheter with superior coil overlying expected location of the right renal pelvis and inferior coil overlying the expected location of the urinary bladder.  Grossly unchanged size then positioning of the approximately 6  mm stone again overlying the expected location of the right UVJ. No additional opacities overlie the right double-J ureteral stent. No definitive opacities overlie the expected location of either renal fossa or the left ureter. Several phleboliths are seen overlying the lower pelvis bilaterally, left greater than right.  Moderate colonic stool burden without evidence of obstruction. Post cholecystectomy. No acute osseus abnormalities.  IMPRESSION: Unchanged size and location of right-sided renal stone again overlying the expected location of the right UVJ.   Electronically Signed   By: Simonne Come M.D.   On: 09/15/2013 09:40   Dg Abd 1 View  09/11/2013   CLINICAL DATA:  Flank pain. Right ureteral stent placement 09/07/2013  EXAM: ABDOMEN - 1 VIEW  COMPARISON:  09/08/2013  FINDINGS: Unchanged orientation of an internal right ureteral stent. Upper and lower limbs are redundant but fully formed. A 5 mm stone continues to project at the level of the lower limb, at the ureteral vesicular junction based on interval CT.  Intrarenal calculi are better discerned on preceding CT. There is a large volume of formed stool. No concerning  intra-abdominal mass effect. Cholecystectomy.  IMPRESSION: 1. Stable positioning of a right ureteral stent. 2. 5 mm stone likely remains at the right UVJ when correlated with CT from yesterday. 3. Possible constipation.   Electronically Signed   By: Tiburcio PeaJonathan  Watts M.D.   On: 09/11/2013 22:40    Microbiology: Recent Results (from the past 240 hour(s))  URINE CULTURE     Status: None   Collection Time    09/11/13  9:02 PM      Result Value Ref Range Status   Specimen Description URINE, CLEAN CATCH   Final   Special Requests NONE   Final   Culture  Setup Time     Final   Value: 09/12/2013 13:44     Performed at [facility]   Colony Count     Final   Value: NO GROWTH     Performed at [facility]   Culture     Final   Value: NO GROWTH     Performed at [facility]   Report Status 09/13/2013 FINAL   Final  CULTURE, BLOOD (ROUTINE X 2)     Status: None   Collection Time    09/11/13  9:24 PM      Result Value Ref Range Status   Specimen Description BLOOD RIGHT ANTECUBITAL   Final   Special Requests BOTTLES DRAWN AEROBIC ONLY 4CC   Final   Culture NO GROWTH 5 DAYS   Final   Report Status 09/16/2013 FINAL   Final  CULTURE, BLOOD (ROUTINE X 2)     Status: None   Collection Time    09/11/13 10:45 PM      Result Value Ref Range Status   Specimen Description BLOOD LEFT HAND   Final   Special Requests BOTTLES DRAWN AEROBIC ONLY 4CC   Final   Culture NO GROWTH 5 DAYS   Final   Report Status 09/16/2013 FINAL   Final  MRSA PCR SCREENING     Status: None   Collection Time    09/12/13  2:00 PM      Result Value Ref Range Status   MRSA by PCR NEGATIVE  NEGATIVE Final   Comment:            The GeneXpert MRSA Assay (FDA     approved for NASAL specimens     only), is one component of a     comprehensive MRSA colonization     surveillance program. It is not     intended to diagnose MRSA     infection nor to guide or     monitor treatment for     MRSA infections.  CULTURE, BLOOD (ROUTINE X 2)     Status: None   Collection Time    09/12/13  5:56 PM      Result Value Ref Range Status   Specimen Description BLOOD RIGHT HAND   Final   Special Requests BOTTLES DRAWN AEROBIC AND ANAEROBIC 10CC   Final   Culture NO GROWTH 4 DAYS   Final   Report Status PENDING   Incomplete     Labs: Basic Metabolic Panel:  Recent Labs Lab 09/11/13 2145 09/12/13 0633  NA 139 141  K 4.2 3.7  CL 102 104  CO2 26 25  GLUCOSE 93 87  BUN 9 6  CREATININE 0.78 0.71  CALCIUM 9.3 9.0   Liver Function Tests:  Recent Labs Lab 09/12/13 0633  AST 21  ALT 23  ALKPHOS 63  BILITOT 0.2*  PROT 7.0  ALBUMIN 3.1*   No results found for this basename: LIPASE, AMYLASE,  in the last 168 hours No results found for this basename: AMMONIA,  in the last 168  hours CBC:  Recent Labs Lab 09/11/13 2145 09/12/13 0633  WBC 6.9 6.5  NEUTROABS 3.3  --   HGB 11.3* 10.8*  HCT 34.9* 33.6*  MCV 94.6 93.9  PLT 407* 383   Cardiac Enzymes: No results found for this basename: CKTOTAL, CKMB, CKMBINDEX, TROPONINI,  in the last 168 hours BNP: BNP (last 3 results) No results found for this basename: PROBNP,  in the last 8760 hours CBG: No results found for this basename: GLUCAP,  in the last 168 hours     Signed:  Penny Pia  [facility] 09/16/2013, 4:56 PM

## 2013-09-16 NOTE — Progress Notes (Signed)
Peripherally Inserted Central Catheter/Midline Placement  The IV Nurse has discussed with the patient and/or persons authorized to consent for the patient, the purpose of this procedure and the potential benefits and risks involved with this procedure.  The benefits include less needle sticks, lab draws from the catheter and patient may be discharged home with the catheter.  Risks include, but not limited to, infection, bleeding, blood clot (thrombus formation), and puncture of an artery; nerve damage and irregular heat beat.  Alternatives to this procedure were also discussed.  PICC/Midline Placement Documentation  PICC / Midline Single Lumen 09/16/13 PICC Right Brachial 40 cm 3 cm (Active)  Indication for Insertion or Continuance of Line Home intravenous therapies (PICC only) 09/16/2013  5:28 PM  Exposed Catheter (cm) 3 cm 09/16/2013  5:28 PM  Site Assessment Clean;Intact;Dry 09/16/2013  5:28 PM  Line Status Flushed;Saline locked;Blood return noted 09/16/2013  5:28 PM  Dressing Type Transparent 09/16/2013  5:28 PM  Dressing Status Clean;Intact;Dry;Antimicrobial disc in place 09/16/2013  5:28 PM  Line Care Connections checked and tightened 09/16/2013  5:28 PM  Line Adjustment (NICU/IV Team Only) No 09/16/2013  5:28 PM  Dressing Intervention New dressing 09/16/2013  5:28 PM  Dressing Change Due 09/23/13 09/16/2013  5:28 PM       Elliot Dallyiggs, Philip Kotlyar Wright 09/16/2013, 5:29 PM

## 2013-09-16 NOTE — Progress Notes (Signed)
Patient ID: [name], female   DOB: 09/13/1985, 28 y.o.   MRN: [id number]  1 Day Post-Op Subjective: Patient s/p ureteroscopic stone removal and stent removal yesterday.  Her pain persists but is certainly improved as expected.  Objective: Vital signs in last 24 hours: Temp:  [97.8 F (36.6 C)-98.5 F (36.9 C)] 97.8 F (36.6 C) (08/08 0536) Pulse Rate:  [71-96] 71 (08/08 0536) Resp:  [14-28] 16 (08/08 0536) BP: (146-167)/(88-106) 155/99 mmHg (08/08 0536) SpO2:  [92 %-100 %] 99 % (08/08 0536)  Intake/Output from previous day: 08/07 0701 - 08/08 0700 In: 1540 [P.O.:240; I.V.:700; IV Piggyback:600] Out: 2050 [Urine:2050] Intake/Output this shift:    Physical Exam:  General: Alert and oriented Back: Mild R CVAT  Assessment/Plan: POD #1 s/p r ureteroscopic stone removal - Ok for discharge from urologic perspective - Would recommend completion of 2 week course of antibiotics for MRSA  - Follow up with Dr. Retta Dionesahlstedt in 3-4 weeks for outpatient follow up   LOS: 5 days   Ulysees Robarts,LES 09/16/2013, 7:31 AM

## 2013-09-16 NOTE — Progress Notes (Signed)
Discharge medications and instructions reviewed with patient.  Patient to d/c home this evening following 1930 dose of vancomycin with picc line in place for home IV antibiotics.

## 2013-09-16 NOTE — Progress Notes (Addendum)
CARE MANAGEMENT NOTE 09/16/2013  Patient:  [name],[name]   Account Number:  [id number]  Date Initiated:  09/12/2013  Documentation initiated by:  Anibal HendersonBOLDEN,GENEVA  Subjective/Objective Assessment:   Admitted with MRSA+ BC, and  pylonephritis. Pt is from home with family and will return home at D/C.     Action/Plan:   If IV ABX needed at D/C pt states she has assistance at home, and can handle this. If IV ABX needed will need HH to follow. Will follow for needs   Anticipated DC Date:  09/14/2013   Anticipated DC Plan:  HOME/SELF CARE      DC Planning Services  CM consult      [facility]   Choice offered to / List presented to:  C-1 Patient        HH arranged  HH-1 RN  IV Antibiotics      HH agency  [facility]   Status of service:  Completed, signed off Medicare Important Message given?  NO (If response is "NO", the following Medicare IM given date fields will be blank) Date Medicare IM given:   Medicare IM given by:   Date Additional Medicare IM given:   Additional Medicare IM given by:    Discharge Disposition:  HOME W HOME HEALTH SERVICES  Per UR Regulation:  Reviewed for med. necessity/level of care/duration of stay  If discussed at Long Length of Stay Meetings, dates discussed:    Comments:  09/16/2013 1900 NCM spoke to pt, [facility] [phone]. States she has seen Dr. Rudi Heaponald Moore at her last appt. Isidoro DonningAlesia Nicolis Boody RN CCM Case Mgmt phone [phone]  09/16/2013 1820 [facility] and  [facility] on 8/9 at 8 am. Confirmed pt's address and contact number. Contacted [facility] with updated address. [address], [place] Isidoro DonningAlesia Nishi Neiswonger RN CCM Case Mgmt phone [phone]  09/16/2013 1630 NCM spoke to pt and [facility]. Waiting call back for soc 09/17/2013 approval. Isidoro DonningAlesia Allisha Harter RN CCM Case Mgmt phone  [phone]  09/12/13 1300 Anibal HendersonGeneva Bolden RN/CM

## 2013-09-17 LAB — CULTURE, BLOOD (ROUTINE X 2): Culture: NO GROWTH

## 2013-09-19 ENCOUNTER — Encounter (HOSPITAL_COMMUNITY): Payer: Self-pay | Admitting: Urology

## 2013-09-26 ENCOUNTER — Telehealth: Payer: Self-pay | Admitting: Infectious Disease

## 2013-09-26 NOTE — Telephone Encounter (Signed)
[facility] having "all kinds of side effects from  Vancomycin" and feeling weak, passing out, blacking out etc.  She apparently reported these symptoms last week as well but refused to got to the ED  I recommended pt go to the ED  I am willing to change her to IV daptomycin if need be but she needs evaluation in ED.  I DO NOT Have availability tomorrow in clinic  I am considered she could be mis-using her IV

## 2013-09-28 ENCOUNTER — Emergency Department (HOSPITAL_COMMUNITY): Payer: Medicaid Other

## 2013-09-28 ENCOUNTER — Emergency Department (HOSPITAL_COMMUNITY)
Admission: EM | Admit: 2013-09-28 | Discharge: 2013-09-29 | Disposition: A | Discharge status: Home / Self Care | Payer: Medicaid Other | Attending: Emergency Medicine | Admitting: Emergency Medicine

## 2013-09-28 ENCOUNTER — Encounter (HOSPITAL_COMMUNITY): Payer: Self-pay | Admitting: Emergency Medicine

## 2013-09-28 DIAGNOSIS — S298XXA Other specified injuries of thorax, initial encounter: Secondary | ICD-10-CM | POA: Diagnosis present

## 2013-09-28 DIAGNOSIS — S6980XA Other specified injuries of unspecified wrist, hand and finger(s), initial encounter: Secondary | ICD-10-CM | POA: Diagnosis not present

## 2013-09-28 DIAGNOSIS — Z8742 Personal history of other diseases of the female genital tract: Secondary | ICD-10-CM | POA: Insufficient documentation

## 2013-09-28 DIAGNOSIS — S6990XA Unspecified injury of unspecified wrist, hand and finger(s), initial encounter: Secondary | ICD-10-CM | POA: Insufficient documentation

## 2013-09-28 DIAGNOSIS — R000 Tachycardia, unspecified: Secondary | ICD-10-CM | POA: Insufficient documentation

## 2013-09-28 DIAGNOSIS — W19XXXA Unspecified fall, initial encounter: Secondary | ICD-10-CM

## 2013-09-28 DIAGNOSIS — R05 Cough: Secondary | ICD-10-CM | POA: Insufficient documentation

## 2013-09-28 DIAGNOSIS — S3981XA Other specified injuries of abdomen, initial encounter: Secondary | ICD-10-CM | POA: Diagnosis not present

## 2013-09-28 DIAGNOSIS — Y929 Unspecified place or not applicable: Secondary | ICD-10-CM | POA: Diagnosis not present

## 2013-09-28 DIAGNOSIS — Z9089 Acquired absence of other organs: Secondary | ICD-10-CM | POA: Diagnosis not present

## 2013-09-28 DIAGNOSIS — Z9889 Other specified postprocedural states: Secondary | ICD-10-CM | POA: Diagnosis not present

## 2013-09-28 DIAGNOSIS — W06XXXA Fall from bed, initial encounter: Secondary | ICD-10-CM | POA: Insufficient documentation

## 2013-09-28 DIAGNOSIS — Z792 Long term (current) use of antibiotics: Secondary | ICD-10-CM | POA: Insufficient documentation

## 2013-09-28 DIAGNOSIS — F17200 Nicotine dependence, unspecified, uncomplicated: Secondary | ICD-10-CM | POA: Diagnosis not present

## 2013-09-28 DIAGNOSIS — Z79899 Other long term (current) drug therapy: Secondary | ICD-10-CM | POA: Insufficient documentation

## 2013-09-28 DIAGNOSIS — Z87440 Personal history of urinary (tract) infections: Secondary | ICD-10-CM | POA: Diagnosis not present

## 2013-09-28 DIAGNOSIS — G8929 Other chronic pain: Secondary | ICD-10-CM | POA: Diagnosis not present

## 2013-09-28 DIAGNOSIS — Y9389 Activity, other specified: Secondary | ICD-10-CM | POA: Diagnosis not present

## 2013-09-28 DIAGNOSIS — Z87442 Personal history of urinary calculi: Secondary | ICD-10-CM | POA: Insufficient documentation

## 2013-09-28 DIAGNOSIS — R059 Cough, unspecified: Secondary | ICD-10-CM | POA: Insufficient documentation

## 2013-09-28 DIAGNOSIS — S0990XA Unspecified injury of head, initial encounter: Secondary | ICD-10-CM | POA: Insufficient documentation

## 2013-09-28 DIAGNOSIS — S20219A Contusion of unspecified front wall of thorax, initial encounter: Secondary | ICD-10-CM | POA: Insufficient documentation

## 2013-09-28 DIAGNOSIS — S20211A Contusion of right front wall of thorax, initial encounter: Secondary | ICD-10-CM

## 2013-09-28 DIAGNOSIS — M79601 Pain in right arm: Secondary | ICD-10-CM

## 2013-09-28 DIAGNOSIS — J45909 Unspecified asthma, uncomplicated: Secondary | ICD-10-CM | POA: Diagnosis not present

## 2013-09-28 LAB — CBC WITH DIFFERENTIAL/PLATELET
Basophils Absolute: 0.1 10*3/uL (ref 0.0–0.1)
Basophils Relative: 1 % (ref 0–1)
EOS ABS: 0.3 10*3/uL (ref 0.0–0.7)
Eosinophils Relative: 3 % (ref 0–5)
HEMATOCRIT: 29.9 % — AB (ref 36.0–46.0)
Hemoglobin: 9.8 g/dL — ABNORMAL LOW (ref 12.0–15.0)
LYMPHS PCT: 29 % (ref 12–46)
Lymphs Abs: 3 10*3/uL (ref 0.7–4.0)
MCH: 30.2 pg (ref 26.0–34.0)
MCHC: 32.8 g/dL (ref 30.0–36.0)
MCV: 92.3 fL (ref 78.0–100.0)
MONOS PCT: 5 % (ref 3–12)
Monocytes Absolute: 0.5 10*3/uL (ref 0.1–1.0)
NEUTROS ABS: 6.4 10*3/uL (ref 1.7–7.7)
NEUTROS PCT: 62 % (ref 43–77)
Platelets: 165 10*3/uL (ref 150–400)
RBC: 3.24 MIL/uL — ABNORMAL LOW (ref 3.87–5.11)
RDW: 14.2 % (ref 11.5–15.5)
WBC: 10.3 10*3/uL (ref 4.0–10.5)

## 2013-09-28 LAB — COMPREHENSIVE METABOLIC PANEL
ALBUMIN: 3 g/dL — AB (ref 3.5–5.2)
ALK PHOS: 98 U/L (ref 39–117)
ALT: 44 U/L — AB (ref 0–35)
AST: 37 U/L (ref 0–37)
Anion gap: 15 (ref 5–15)
BUN: 10 mg/dL (ref 6–23)
CALCIUM: 9.1 mg/dL (ref 8.4–10.5)
CO2: 22 mEq/L (ref 19–32)
Chloride: 102 mEq/L (ref 96–112)
Creatinine, Ser: 0.89 mg/dL (ref 0.50–1.10)
GFR calc Af Amer: >90 mL/min (ref >90)
GFR calc non Af Amer: 87 mL/min — ABNORMAL LOW (ref >90)
GLUCOSE: 109 mg/dL — AB (ref 70–99)
POTASSIUM: 3.8 meq/L (ref 3.7–5.3)
SODIUM: 139 meq/L (ref 137–147)
TOTAL PROTEIN: 7.1 g/dL (ref 6.0–8.3)
Total Bilirubin: 0.6 mg/dL (ref 0.3–1.2)

## 2013-09-28 LAB — LACTIC ACID, PLASMA: Lactic Acid, Venous: 2.1 mmol/L (ref 0.5–2.2)

## 2013-09-28 MED ORDER — HYDROMORPHONE HCL PF 1 MG/ML IJ SOLN
1.0000 mg | Freq: Once | INTRAMUSCULAR | Status: AC
  Administered 2013-09-28: 1 mg via INTRAVENOUS
  Filled 2013-09-28: qty 1

## 2013-09-28 MED ORDER — ONDANSETRON HCL 4 MG/2ML IJ SOLN
4.0000 mg | Freq: Once | INTRAMUSCULAR | Status: DC
  Filled 2013-09-28: qty 2

## 2013-09-28 MED ORDER — PROMETHAZINE HCL 25 MG/ML IJ SOLN
12.5000 mg | Freq: Once | INTRAMUSCULAR | Status: AC
  Administered 2013-09-28: 12.5 mg via INTRAVENOUS
  Filled 2013-09-28: qty 1

## 2013-09-28 MED ORDER — SODIUM CHLORIDE 0.9 % IV SOLN
INTRAVENOUS | Status: DC
  Administered 2013-09-28: 17:00:00 via INTRAVENOUS

## 2013-09-28 MED ORDER — HEPARIN SOD (PORK) LOCK FLUSH 100 UNIT/ML IV SOLN
INTRAVENOUS | Status: AC
  Administered 2013-09-28
  Filled 2013-09-28: qty 5

## 2013-09-28 MED ORDER — PROMETHAZINE HCL 25 MG/ML IJ SOLN
12.5000 mg | Freq: Four times a day (QID) | INTRAMUSCULAR | Status: DC | PRN
  Administered 2013-09-28: 12.5 mg via INTRAVENOUS
  Filled 2013-09-28: qty 1

## 2013-09-28 MED ORDER — PROMETHAZINE HCL 25 MG PO TABS: 25.0000 mg | ORAL_TABLET | Freq: Four times a day (QID) | ORAL | Status: AC | PRN

## 2013-09-28 MED ORDER — OXYCODONE HCL 5 MG PO TABS: 5.0000 mg | ORAL_TABLET | ORAL | Status: AC | PRN

## 2013-09-28 MED ORDER — HYDROMORPHONE HCL PF 1 MG/ML IJ SOLN
1.0000 mg | Freq: Once | INTRAMUSCULAR | Status: AC
  Administered 2013-09-28: 1 mg via INTRAVENOUS

## 2013-09-28 MED ORDER — HYDROMORPHONE HCL PF 1 MG/ML IJ SOLN
1.0000 mg | Freq: Once | INTRAMUSCULAR | Status: DC
  Filled 2013-09-28 (×2): qty 1

## 2013-09-28 NOTE — ED Provider Notes (Signed)
CSN: [id number]     Arrival date & time 09/28/13  1535 History   First MD Initiated Contact with Patient 09/28/13 1549    This chart was scribed for Vanetta Mulders, MD by Marica Otter, ED Scribe. This patient was seen in room [facility] and the patient's care was started at 4:31 PM.  Chief Complaint  Patient presents with  . Fall   The history is provided by a relative. No language interpreter was used.   HPI Comments: PCP: Rudi Heap, [facility]  Infections Diseases Provider: Dr. Daiva Eves  [name] is a 28 y.o. female, with medical Hx noted below, who presents to the Emergency Department complaining of a fall from pt's bed to the floor around 12:30PM today. Per relative, pt hit her head on the nightstand when she fell. Pt complains of associated pain to the: right arm, right lower ribs, right thigh, abd, and right forehead. Pt also states she had fever, chills, diaphoresis, and vision changes in the past few days. Pt further complains of a cough and low O2 levels, stating that her O2 levels were in the upper 80s "the other day." During exam pt's O2 levels are between 99-100. Pt also complains of nausea, vomiting (with last episode of emesis taking place last night). Pt denies neck pain, pain to the left arm, diarrhea, or rash.   Pt states that she would have come to the ED today regardless of the fall because she has been "blacking out," and experiencing falls recently. Pt specifies that prior to today's fall she "blacked out" and fell 2 days ago and had a similar episode 2 days prior to then.    Pt further reports that she has been ill for the past few weeks. Pt had an ureteral stent placement in the beginning of August and states she was admitted to [facility] for right sided abd pain.   While at [facility] pt was found to have kidney stones w/ pyelonephritis & hydronephrosis on R side. Pt was also informed that she was had a positive blood culture for  MRSA after discharge. Thereafter, pt returned to [facility] for further treatment, subsequently transferred to [facility] where she was admitted to the hospital. On 09/15/13 pt had the following proceures: Cystourethroscopy, Right ureteroscopy, Basket stone extraction and Intraoperative fluoroscopy with interpretation. Right ureteral stent removal. Pt further states that her ID provider started her on Vancomycin (Intraveneous Route) and she has been experiencing "black outs" since starting the med.   Pt reports allergies to CT dye, morphine, zofran and toradol.  Past Medical History  Diagnosis Date  . Ovarian cyst   . UTI (lower urinary tract infection)   . Asthma   . Kidney stones   . Calcium oxalate renal stones   . Chronic pelvic pain in female    Past Surgical History  Procedure Laterality Date  . Ovarian cyst removal    . Lithotripsy    . Laparoscopic tubal ligation    . Cholecystectomy    . Appendectomy    . Tee without cardioversion N/A 10/31/2012    Procedure: TRANSESOPHAGEAL ECHOCARDIOGRAM (TEE);  Surgeon: Laurey Morale, MD;  Location: [facility] ENDOSCOPY;  Service: Cardiovascular;  Laterality: N/A;  . Cystoscopy with retrograde pyelogram, ureteroscopy and stent placement Right 09/15/2013    Procedure: CYSTOSCOPY WITH RETROGRADE PYELOGRAM, URETEROSCOPY, STONE BASKETRY;  Surgeon: Heloise Purpura, MD;  Location: [facility] ORS;  Service: Urology;  Laterality: Right;   Family History  Problem Relation Age of Onset  .  Diabetes Other   . Seizures Other   . Cancer Other    History  Substance Use Topics  . Smoking status: Current Every Day Smoker -- 0.50 packs/day for 14 years    Types: Cigarettes  . Smokeless tobacco: Never Used  . Alcohol Use: No   OB History   Grav Para Term Preterm Abortions TAB SAB Ect Mult Living   13 2 1 1 10  9 1  2      Review of Systems  Constitutional: Positive for fever, chills and diaphoresis.  HENT:       Right forehead pain   Eyes: Positive for visual disturbance.   Respiratory: Positive for cough.   Cardiovascular: Negative for chest pain.  Gastrointestinal: Positive for abdominal pain.  Musculoskeletal:       Right arm, right lower ribs, right thigh  Neurological: Positive for syncope and headaches (head trauma from fall).  Psychiatric/Behavioral: Negative for confusion.   Allergies  Iohexol; Zofran; Ketorolac tromethamine; and Morphine and related  Home Medications   Prior to Admission medications   Medication Sig Start Date End Date Taking? Authorizing Provider  DAPTOmycin (CUBICIN) 500 MG injection Inject 600 mg into the vein daily. Started on 09/27/13   Yes Historical Provider, MD  ibuprofen (ADVIL,MOTRIN) 800 MG tablet Take 800 mg by mouth daily as needed for pain. 11/14/11  Yes Kathie Dike, PA-C  oxyCODONE (OXY IR/ROXICODONE) 5 MG immediate release tablet Take 1 tablet (5 mg total) by mouth every 6 (six) hours as needed for breakthrough pain. 09/16/13  Yes Penny Pia, MD  QUEtiapine (SEROQUEL) 100 MG tablet Take 200 mg by mouth at bedtime.    Yes Historical Provider, MD  oxyCODONE (ROXICODONE) 5 MG immediate release tablet Take 1 tablet (5 mg total) by mouth every 4 (four) hours as needed for severe pain. 09/28/13   Vanetta Mulders, MD  promethazine (PHENERGAN) 25 MG tablet Take 1 tablet (25 mg total) by mouth every 6 (six) hours as needed. 09/28/13   Vanetta Mulders, MD   Triage Vitals: Pulse 110  Temp(Src) 98.6 F (37 C) (Oral)  Resp 20  Ht 5\' 8"  (1.727 m)  Wt 200 lb (90.719 kg)  BMI 30.42 kg/m2  SpO2 100%  LMP 09/01/2013 Physical Exam  Nursing note and vitals reviewed. Constitutional: She is oriented to person, place, and time. She appears well-developed and well-nourished. She appears distressed.  HENT:  Head: Normocephalic and atraumatic.  Eyes: Conjunctivae and EOM are normal.  Neck: Neck supple. No tracheal deviation present.  Cardiovascular: Normal rate and regular rhythm.   No murmur heard. Mildly tachycardic    Pulmonary/Chest: Effort normal. No respiratory distress. She exhibits tenderness (right sided chest tenderness).  Abdominal: Bowel sounds are normal. There is tenderness (Right upper Quadrant ).  Musculoskeletal: Normal range of motion.  Cap refill 1 second, no swelling in the ankles. Tender at shoulder but no deformities, no clavicle tenderness,   Neurological: She is alert and oriented to person, place, and time.  Skin: Skin is warm and dry.  Superficial abrasions on right, middle finger. Cap refill on right fingers 1 second, radial pulse 2+  Psychiatric: She has a normal mood and affect.    ED Course  Procedures (including critical care time) DIAGNOSTIC STUDIES:   COORDINATION OF CARE:   Labs Review Labs Reviewed  CBC WITH DIFFERENTIAL - Abnormal; Notable for the following:    RBC 3.24 (*)    Hemoglobin 9.8 (*)    HCT 29.9 (*)  All other components within normal limits  COMPREHENSIVE METABOLIC PANEL - Abnormal; Notable for the following:    Glucose, Bld 109 (*)    Albumin 3.0 (*)    ALT 44 (*)    GFR calc non Af Amer 87 (*)    All other components within normal limits  URINE CULTURE  LACTIC ACID, PLASMA  URINALYSIS, ROUTINE W REFLEX MICROSCOPIC   Results for orders placed during the hospital encounter of 09/28/13  LACTIC ACID, PLASMA      Result Value Ref Range   Lactic Acid, Venous 2.1  0.5 - 2.2 mmol/L  CBC WITH DIFFERENTIAL      Result Value Ref Range   WBC 10.3  4.0 - 10.5 K/uL   RBC 3.24 (*) 3.87 - 5.11 MIL/uL   Hemoglobin 9.8 (*) 12.0 - 15.0 g/dL   HCT 16.129.9 (*) 09.636.0 - 04.546.0 %   MCV 92.3  78.0 - 100.0 fL   MCH 30.2  26.0 - 34.0 pg   MCHC 32.8  30.0 - 36.0 g/dL   RDW 40.914.2  81.111.5 - 91.415.5 %   Platelets 165  150 - 400 K/uL   Neutrophils Relative % 62  43 - 77 %   Lymphocytes Relative 29  12 - 46 %   Monocytes Relative 5  3 - 12 %   Eosinophils Relative 3  0 - 5 %   Basophils Relative 1  0 - 1 %   Neutro Abs 6.4  1.7 - 7.7 K/uL   Lymphs Abs 3.0  0.7 - 4.0  K/uL   Monocytes Absolute 0.5  0.1 - 1.0 K/uL   Eosinophils Absolute 0.3  0.0 - 0.7 K/uL   Basophils Absolute 0.1  0.0 - 0.1 K/uL   RBC Morphology POLYCHROMASIA PRESENT     WBC Morphology ATYPICAL LYMPHOCYTES    COMPREHENSIVE METABOLIC PANEL      Result Value Ref Range   Sodium 139  137 - 147 mEq/L   Potassium 3.8  3.7 - 5.3 mEq/L   Chloride 102  96 - 112 mEq/L   CO2 22  19 - 32 mEq/L   Glucose, Bld 109 (*) 70 - 99 mg/dL   BUN 10  6 - 23 mg/dL   Creatinine, Ser 7.820.89  0.50 - 1.10 mg/dL   Calcium 9.1  8.4 - 95.610.5 mg/dL   Total Protein 7.1  6.0 - 8.3 g/dL   Albumin 3.0 (*) 3.5 - 5.2 g/dL   AST 37  0 - 37 U/L   ALT 44 (*) 0 - 35 U/L   Alkaline Phosphatase 98  39 - 117 U/L   Total Bilirubin 0.6  0.3 - 1.2 mg/dL   GFR calc non Af Amer 87 (*) >90 mL/min   GFR calc Af Amer >90  >90 mL/min   Anion gap 15  5 - 15     Imaging Review Ct Abdomen Pelvis Wo Contrast  09/28/2013   CLINICAL DATA:  Larey SeatFell.  Chest and abdominal pain.  EXAM: CT CHEST, ABDOMEN AND PELVIS WITHOUT CONTRAST  TECHNIQUE: Multidetector CT imaging of the chest, abdomen and pelvis was performed following the standard protocol without IV contrast.  COMPARISON:  Numerous prior CT scans. This young patient has head 7 CT scans this year and numerous in prior years. Would strongly urge using other imaging modalities to decrease radiation exposure.  FINDINGS: CT CHEST FINDINGS  The chest wall is unremarkable. No breast masses, supraclavicular or axillary adenopathy. A right-sided PICC line is noted. The  heart is unremarkable. No obvious mass or adenopathy. The aorta is normal in caliber. The esophagus is grossly normal.  The lungs demonstrate a fine reticulonodular interstitial pattern. 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 A few patchy areas of subpleural atelectasis but no focal airspace consolidation or pleural effusion.  CT ABDOMEN AND PELVIS FINDINGS  The liver is  unremarkable. The gallbladder surgically absent. The spleen is normal in size. The pancreas is grossly normal. The adrenal glands are within normal limits. There are bilateral lower pole renal calculi. No definite obstructing ureteral calculi or bladder calculi.  The stomach, duodenum, small bowel and colon are grossly normal without oral contrast. No inflammatory changes, mass lesions or obstructive findings. The aorta is normal in caliber. The uterus and ovaries are unremarkable. The bladder appears normal. Small scattered inguinal lymph nodes are noted bilaterally.  The bony structures are unremarkable.  IMPRESSION: 1. 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 2. Bilateral renal calculi but no definite obstructing ureteral calculi.   Electronically Signed   By: Loralie Champagne M.D.   On: 09/28/2013 18:34   Dg Forearm Right  09/28/2013   CLINICAL DATA:  Syncope, fall, RIGHT forearm pain  EXAM: RIGHT FOREARM - 2 VIEW  COMPARISON:  None  FINDINGS: Osseous mineralization normal.  Joint spaces preserved.  No acute fracture, dislocation or bone destruction.  Probable mild dorsal soft tissue swelling at mid forearm.  IMPRESSION: No acute osseous abnormalities.   Electronically Signed   By: Ulyses Southward M.D.   On: 09/28/2013 20:55   Ct Head Wo Contrast  09/28/2013   CLINICAL DATA:  Status post fall  EXAM: CT HEAD WITHOUT CONTRAST  CT CERVICAL SPINE WITHOUT CONTRAST  TECHNIQUE: Multidetector CT imaging of the head and cervical spine was performed following the standard protocol without intravenous contrast. Multiplanar CT image reconstructions of the cervical spine were also generated.  COMPARISON:  01/22/2007.  FINDINGS: CT HEAD FINDINGS  No acute cortical infarct, hemorrhage, or mass lesion ispresent. There are several foci of nonspecific, low attenuation within the subcortical white matter of the left frontal and parietal lobe. These appear new  from previous exam. Ventricles are of normal size. No significant extra-axial fluid collection is present. The paranasal sinuses andmastoid air cells are clear. The osseous skull is intact.  CT CERVICAL SPINE FINDINGS  There is straightening of normal cervical lordosis. The vertebral body heights and disc spaces are well preserved. The facet joints are aligned. The prevertebral soft tissue space is normal. No fractures identified.  IMPRESSION: 1. No acute intracranial abnormalities. 2. Nonspecific white matter abnormality in the left cerebral hemisphere. Suggest followup imaging with nonemergent brain MRI. 3. No evidence for cervical spine fracture.   Electronically Signed   By: Signa Kell M.D.   On: 09/28/2013 18:32   Ct Chest Wo Contrast  09/28/2013   CLINICAL DATA:  Larey Seat.  Chest and abdominal pain.  EXAM: CT CHEST, ABDOMEN AND PELVIS WITHOUT CONTRAST  TECHNIQUE: Multidetector CT imaging of the chest, abdomen and pelvis was performed following the standard protocol without IV contrast.  COMPARISON:  Numerous prior CT scans. This young patient has head 7 CT scans this year and numerous in prior years. Would strongly urge using other imaging modalities to decrease radiation exposure.  FINDINGS: CT CHEST FINDINGS  The chest wall is unremarkable. No breast masses, supraclavicular or axillary adenopathy. A right-sided PICC line is noted.  The heart is unremarkable. No obvious mass or adenopathy. The aorta is normal in caliber. The esophagus is grossly normal.  The lungs demonstrate a fine reticulonodular interstitial pattern. 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 A few patchy areas of subpleural atelectasis but no focal airspace consolidation or pleural effusion.  CT ABDOMEN AND PELVIS FINDINGS  The liver is unremarkable. The gallbladder surgically absent. The spleen is normal in size. The pancreas is grossly normal. The adrenal glands are within  normal limits. There are bilateral lower pole renal calculi. No definite obstructing ureteral calculi or bladder calculi.  The stomach, duodenum, small bowel and colon are grossly normal without oral contrast. No inflammatory changes, mass lesions or obstructive findings. The aorta is normal in caliber. The uterus and ovaries are unremarkable. The bladder appears normal. Small scattered inguinal lymph nodes are noted bilaterally.  The bony structures are unremarkable.  IMPRESSION: 1. 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 2. Bilateral renal calculi but no definite obstructing ureteral calculi.   Electronically Signed   By: Loralie Champagne M.D.   On: 09/28/2013 18:34   Ct Cervical Spine Wo Contrast  09/28/2013   CLINICAL DATA:  Status post fall  EXAM: CT HEAD WITHOUT CONTRAST  CT CERVICAL SPINE WITHOUT CONTRAST  TECHNIQUE: Multidetector CT imaging of the head and cervical spine was performed following the standard protocol without intravenous contrast. Multiplanar CT image reconstructions of the cervical spine were also generated.  COMPARISON:  01/22/2007.  FINDINGS: CT HEAD FINDINGS  No acute cortical infarct, hemorrhage, or mass lesion ispresent. There are several foci of nonspecific, low attenuation within the subcortical white matter of the left frontal and parietal lobe. These appear new from previous exam. Ventricles are of normal size. No significant extra-axial fluid collection is present. The paranasal sinuses andmastoid air cells are clear. The osseous skull is intact.  CT CERVICAL SPINE FINDINGS  There is straightening of normal cervical lordosis. The vertebral body heights and disc spaces are well preserved. The facet joints are aligned. The prevertebral soft tissue space is normal. No fractures identified.  IMPRESSION: 1. No acute intracranial abnormalities. 2. Nonspecific white matter abnormality in the left cerebral hemisphere.  Suggest followup imaging with nonemergent brain MRI. 3. No evidence for cervical spine fracture.   Electronically Signed   By: Signa Kell M.D.   On: 09/28/2013 18:32   Dg Humerus Right  09/28/2013   CLINICAL DATA:  Right arm pain.  Fell.  EXAM: RIGHT HUMERUS - 2+ VIEW  COMPARISON:  None.  FINDINGS: The shoulder and elbow joints are maintained. The humerus is intact.  IMPRESSION: No acute bony findings.   Electronically Signed   By: Loralie Champagne M.D.   On: 09/28/2013 20:56     EKG Interpretation None      MDM   Final diagnoses:  Fall, initial encounter  Rib contusion, right, initial encounter  Pain of right upper extremity    Patient without evidence of any acute injuries from the fall. Probably has rib contusions on the right side with his tenderness. Possibly could be a rib fracture there even with the cartilage part of the ribs. No evidence underlying lung problem. CT head neck abdomen pelvis without any acute injuries and CT of chest without acute injuries. 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 Patient is still receiving  her home IV vancomycin is followed by infectious disease has home nurse at home. Patient will have her pain medicine renewed and Phenergan renewed and can continue followup with those providers. Patient's basic labs without evidence of any sniffing abnormality of mild anemia but no significant leukocytosis.    I personally performed the services described in this documentation, which was scribed in my presence. The recorded information has been reviewed and is accurate.      Vanetta Mulders, MD 09/28/13 [phone]

## 2013-09-28 NOTE — ED Notes (Signed)
Pt. Refusing to give urine sample at this time stating "I already know I have an infection, that is not why I am here today".

## 2013-09-28 NOTE — ED Notes (Addendum)
Pt family reports that pt has had several recent falls and disorientation. Pt receiving abx through picc line from a "blood stream infection from a uretal stent". Pt alert but lethargic and reporting RUE pain and RLE. Dyspnea noted with exertion. Airway patent. Pt reports hitting head and loc this am.

## 2013-09-28 NOTE — ED Notes (Signed)
Pt. Requesting to speak to Dr. Deretha EmoryZackowski. Dr. Deretha EmoryZackowski notified

## 2013-09-28 NOTE — Discharge Instructions (Signed)
Workup here fall without evidence of acute injury. Take pain medicine as directed. Take the Phenergan as directed. Continue followup with your doctors. Continue followup with her home nurse.

## 2013-09-28 NOTE — ED Notes (Signed)
Pt. Placed on 2L [place]

## 2013-10-06 ENCOUNTER — Encounter: Payer: Self-pay | Admitting: Infectious Disease

## 2013-10-10 DEATH — deceased

## 2013-10-12 ENCOUNTER — Ambulatory Visit: Payer: Medicaid Other | Admitting: Infectious Disease

## 2013-10-25 ENCOUNTER — Ambulatory Visit: Payer: Medicaid Other | Admitting: Infectious Disease

## 2013-12-11 ENCOUNTER — Encounter (HOSPITAL_COMMUNITY): Payer: Self-pay | Admitting: Emergency Medicine

## 2016-04-10 IMAGING — CT CT ABD-PELV W/O CM
2 of 6 series · 14 of 46 positions shown, 18 images · non-contrast
Comparison: 04/30/2013

CLINICAL DATA: Right sided flank pain.

EXAM:
CT ABDOMEN AND PELVIS WITHOUT CONTRAST
TECHNIQUE: Multidetector CT imaging of the abdomen and pelvis was performed
following the standard protocol without IV contrast.

[Series 2: standard/full over (age)lbs 5.0 · axial · 0.66mm/px · z∈[+606,+990]mm · 11 of 92 slices shown, 15 images]
[im 10/92  soft-tissue]
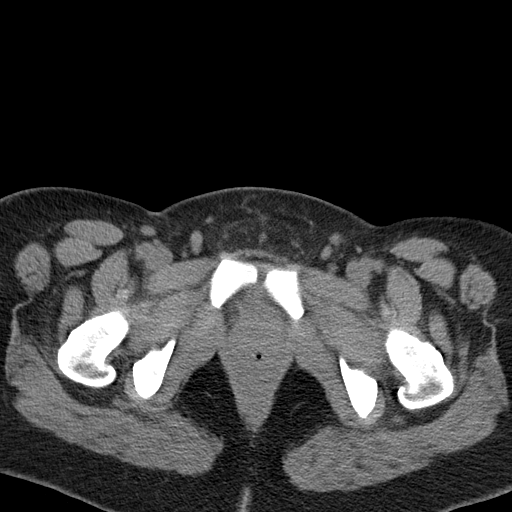
[im 10/92  bone]
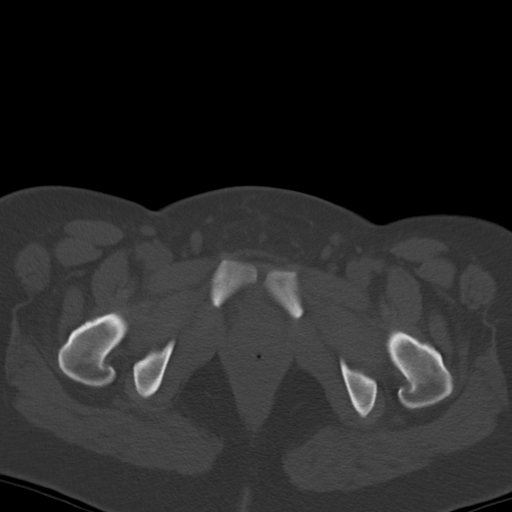
[im 19/92  soft-tissue]
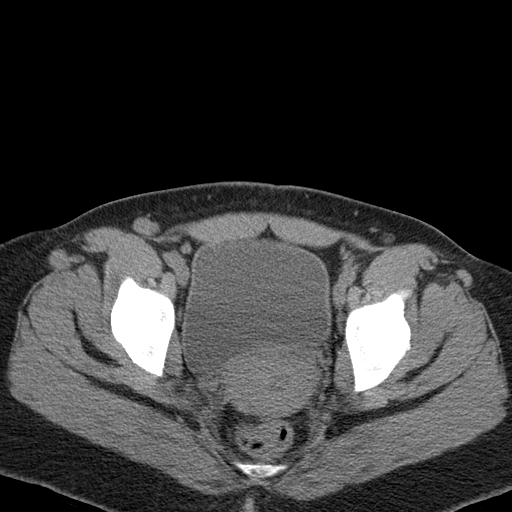
[im 28/92  soft-tissue]
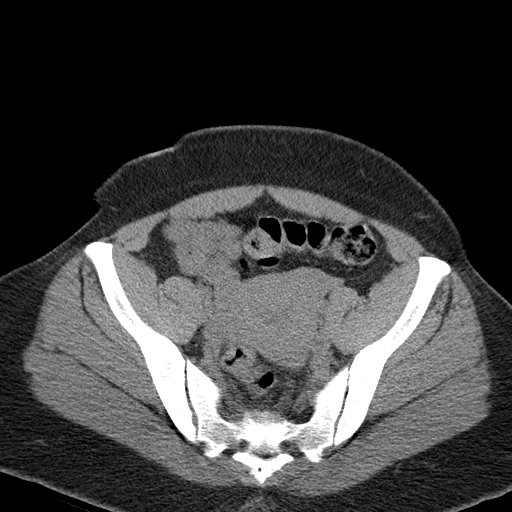
[im 37/92  soft-tissue]
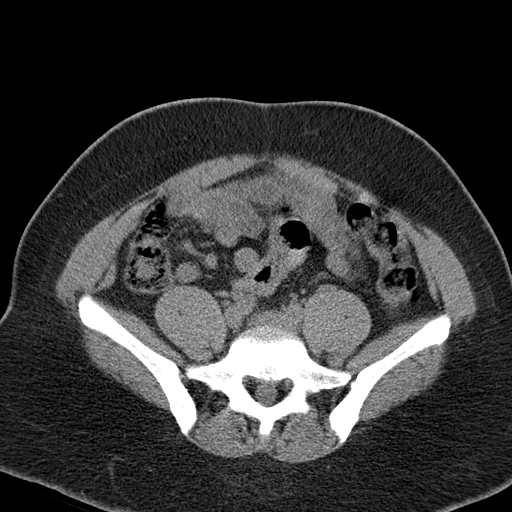
[im 46/92  soft-tissue]
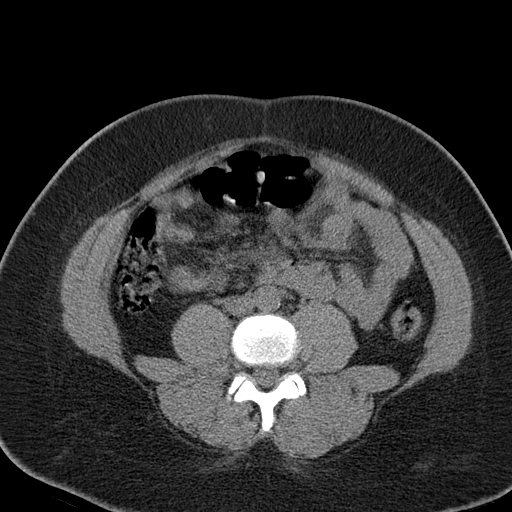
[im 55/92  soft-tissue]
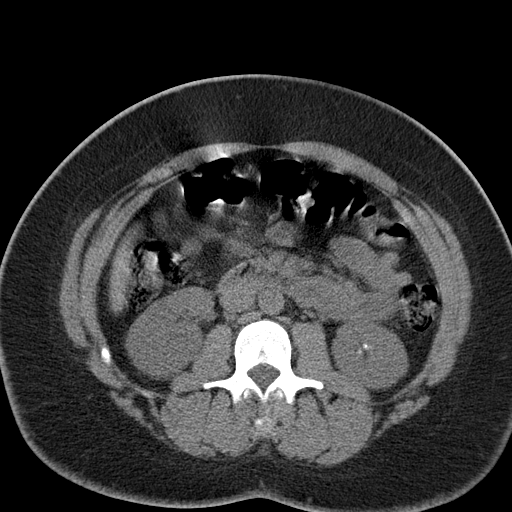
[im 64/92  soft-tissue]
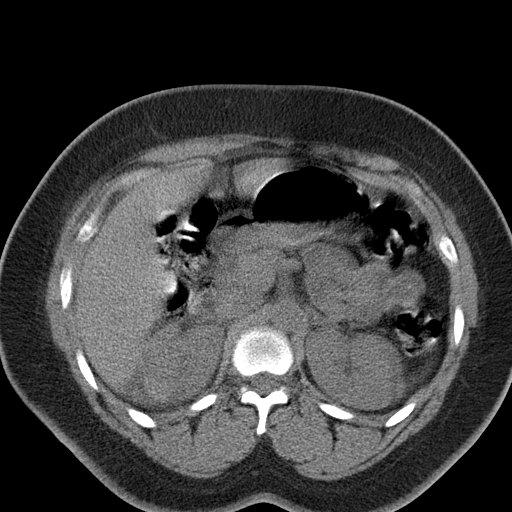
[im 73/92  soft-tissue]
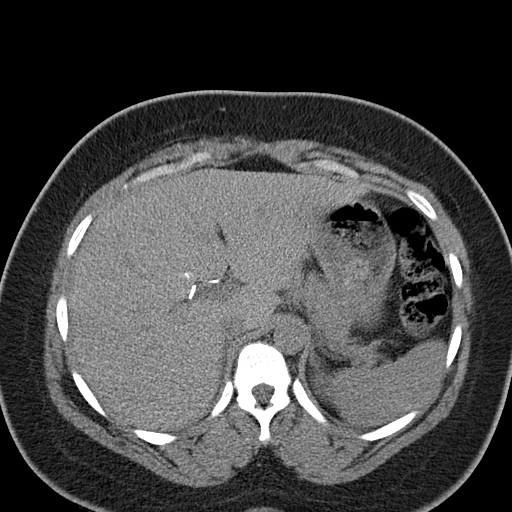
[im 73/92  lung]
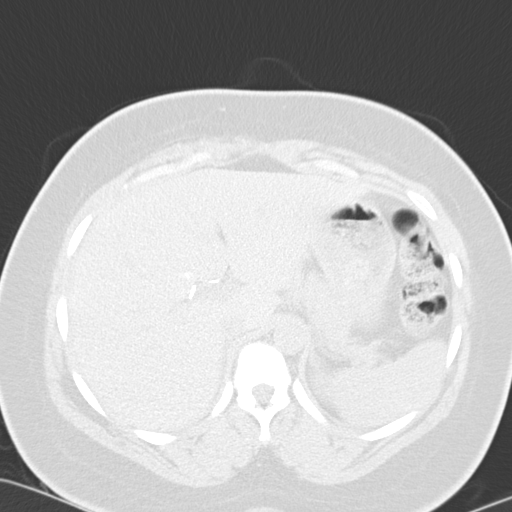
[im 78/92  lung]
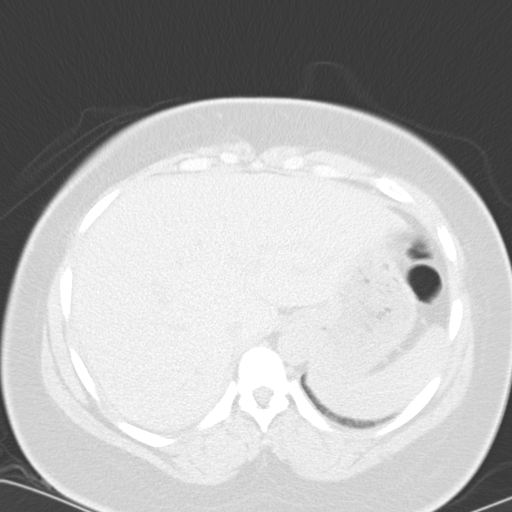
[im 82/92  soft-tissue]
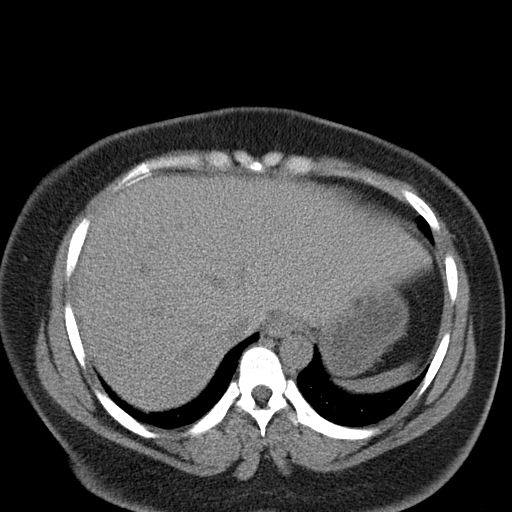
[im 82/92  lung]
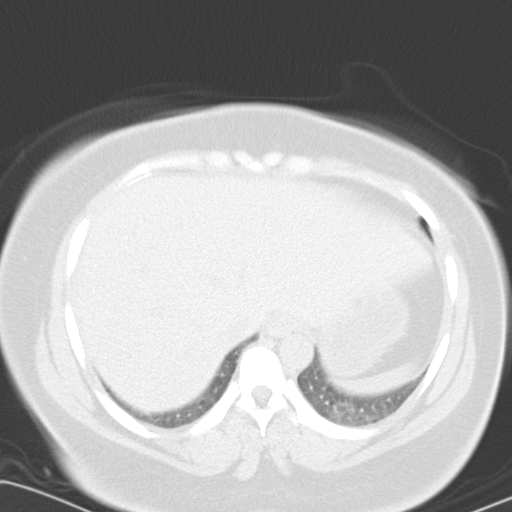
[im 82/92  bone]
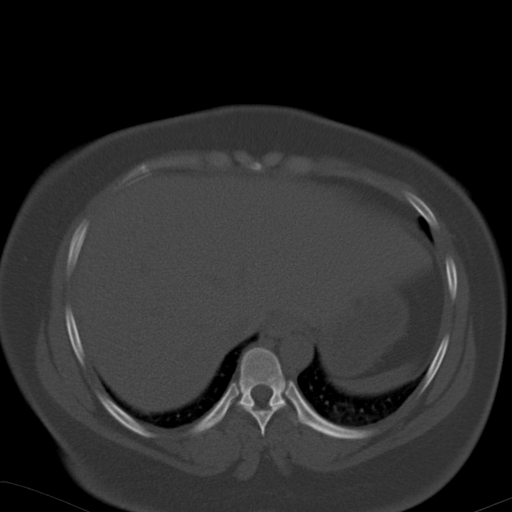
[im 87/92  lung]
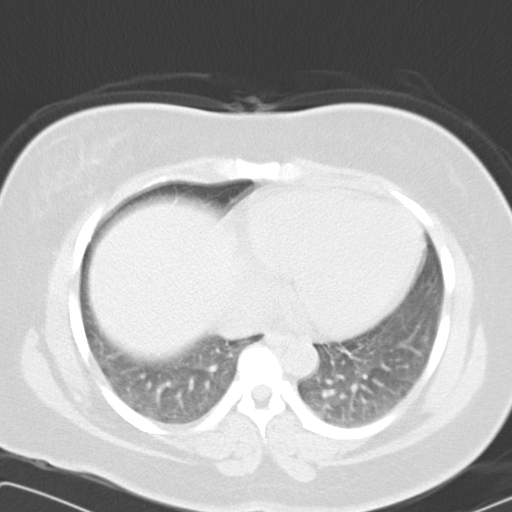

[Series 4: mpr coronal · coronal · 0.71mm/px · 3 of 102 slices shown]
[im 26/102  soft-tissue]
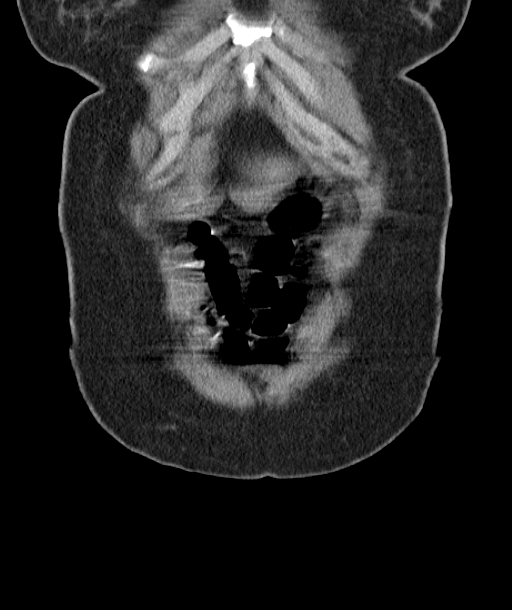
[im 51/102  soft-tissue]
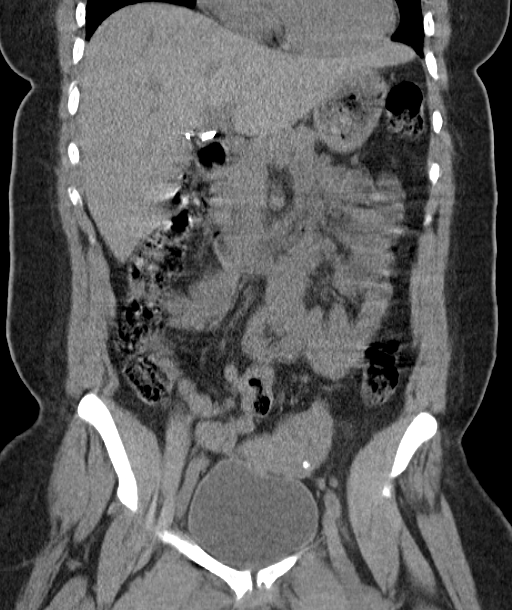
[im 76/102  soft-tissue]
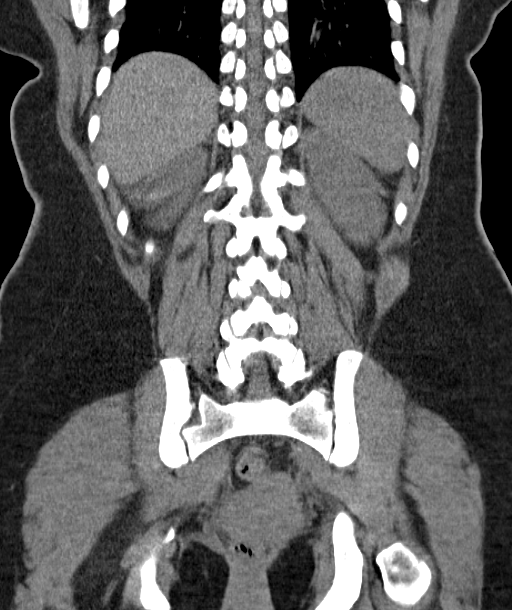

[14 of 46 positions shown; findings below may reference images not displayed]

FINDINGS: The lung bases are clear. No pleural or pericardial effusion. No
focal liver abnormality identified. Previous cholecystectomy. No
biliary dilatation. Normal appearance of the pancreas. The spleen is
normal.

The adrenal glands are normal. Stone within the inferior pole there
are several stones within the inferior pole the left kidney
measuring up to 5 mm. Stone within the lower pole calculus of the
right kidney measures 5 mm, image 62/series 4. The urinary bladder
appears normal. No ureteral calculi identified. The uterus and the
adnexal structures are unremarkable.

Normal caliber of the abdominal aorta. No aneurysm. No upper
abdominal adenopathy. There is no pelvic adenopathy. Prominent
bilateral inguinal lymph nodes are identified. Right inguinal node
now measures 1.2 cm. Previously this measured 0.9 cm. No free fluid
or fluid collections within the abdomen or pelvis.

The stomach is normal. The small bowel loops have a normal course
and caliber. No obstruction. Normal appearance of the proximal
colon. Multiple distal colonic diverticula are identified without
acute inflammation. No free fluid or fluid collections within the
abdomen or pelvis. Injections sites are identified within both
buttock areas.

Review of the visualized bony structures is on unremarkable.
IMPRESSION: 1. Bilateral renal calculi.  No evidence for obstructive uropathy.
2. Prior cholecystectomy.
3. Increase in size of inguinal lymph nodes. Findings are favored to
represent reactive adenopathy.
